# Patient Record
Sex: Female | Born: 1965 | Race: Black or African American | Hispanic: No | Marital: Single | Smoking: Light tobacco smoker
Health system: Southern US, Community
[De-identification: ages and names within clinical notes are randomized; demographics above are authoritative.]

## PROBLEM LIST (undated history)

## (undated) DIAGNOSIS — J449 Chronic obstructive pulmonary disease, unspecified: Secondary | ICD-10-CM

## (undated) DIAGNOSIS — K219 Gastro-esophageal reflux disease without esophagitis: Secondary | ICD-10-CM

## (undated) DIAGNOSIS — E785 Hyperlipidemia, unspecified: Secondary | ICD-10-CM

## (undated) DIAGNOSIS — I739 Peripheral vascular disease, unspecified: Secondary | ICD-10-CM

## (undated) DIAGNOSIS — M1990 Unspecified osteoarthritis, unspecified site: Secondary | ICD-10-CM

## (undated) DIAGNOSIS — C73 Malignant neoplasm of thyroid gland: Secondary | ICD-10-CM

## (undated) DIAGNOSIS — E079 Disorder of thyroid, unspecified: Secondary | ICD-10-CM

## (undated) DIAGNOSIS — I609 Nontraumatic subarachnoid hemorrhage, unspecified: Secondary | ICD-10-CM

## (undated) DIAGNOSIS — G4700 Insomnia, unspecified: Secondary | ICD-10-CM

## (undated) DIAGNOSIS — I2510 Atherosclerotic heart disease of native coronary artery without angina pectoris: Secondary | ICD-10-CM

## (undated) DIAGNOSIS — G459 Transient cerebral ischemic attack, unspecified: Secondary | ICD-10-CM

## (undated) DIAGNOSIS — I10 Essential (primary) hypertension: Secondary | ICD-10-CM

## (undated) DIAGNOSIS — F419 Anxiety disorder, unspecified: Secondary | ICD-10-CM

## (undated) DIAGNOSIS — I509 Heart failure, unspecified: Secondary | ICD-10-CM

## (undated) DIAGNOSIS — E119 Type 2 diabetes mellitus without complications: Secondary | ICD-10-CM

## (undated) DIAGNOSIS — H43392 Other vitreous opacities, left eye: Secondary | ICD-10-CM

## (undated) DIAGNOSIS — I82409 Acute embolism and thrombosis of unspecified deep veins of unspecified lower extremity: Secondary | ICD-10-CM

## (undated) DIAGNOSIS — C4490 Unspecified malignant neoplasm of skin, unspecified: Secondary | ICD-10-CM

## (undated) DIAGNOSIS — J439 Emphysema, unspecified: Secondary | ICD-10-CM

## (undated) DIAGNOSIS — F329 Major depressive disorder, single episode, unspecified: Secondary | ICD-10-CM

## (undated) DIAGNOSIS — N183 Chronic kidney disease, stage 3 unspecified: Secondary | ICD-10-CM

## (undated) HISTORY — DX: Heart failure, unspecified: I50.9

## (undated) HISTORY — DX: Chronic obstructive pulmonary disease, unspecified: J44.9

## (undated) HISTORY — DX: Chronic kidney disease, stage 3 unspecified: N18.30

## (undated) HISTORY — DX: Gastro-esophageal reflux disease without esophagitis: K21.9

## (undated) HISTORY — DX: Atherosclerotic heart disease of native coronary artery without angina pectoris: I25.10

## (undated) HISTORY — DX: Anxiety disorder, unspecified: F41.9

## (undated) HISTORY — PX: EYE SURGERY: SHX253

## (undated) HISTORY — DX: Peripheral vascular disease, unspecified: I73.9

## (undated) HISTORY — DX: Insomnia, unspecified: G47.00

## (undated) HISTORY — DX: Unspecified malignant neoplasm of skin, unspecified: C44.90

## (undated) HISTORY — DX: Emphysema, unspecified: J43.9

## (undated) HISTORY — DX: Malignant neoplasm of thyroid gland: C73

## (undated) HISTORY — DX: Nontraumatic subarachnoid hemorrhage, unspecified: I60.9

## (undated) HISTORY — DX: Hyperlipidemia, unspecified: E78.5

## (undated) HISTORY — DX: Major depressive disorder, single episode, unspecified: F32.9

## (undated) HISTORY — DX: Unspecified osteoarthritis, unspecified site: M19.90

---

## 1989-06-07 HISTORY — PX: TUBAL LIGATION: SHX77

## 2000-01-26 ENCOUNTER — Other Ambulatory Visit: Admission: RE | Admit: 2000-01-26 | Discharge: 2000-01-26 | Payer: Self-pay | Admitting: Family Medicine

## 2000-12-01 ENCOUNTER — Emergency Department (HOSPITAL_COMMUNITY): Admission: EM | Admit: 2000-12-01 | Discharge: 2000-12-01 | Payer: Self-pay | Admitting: Internal Medicine

## 2003-03-19 ENCOUNTER — Emergency Department (HOSPITAL_COMMUNITY): Admission: AD | Admit: 2003-03-19 | Discharge: 2003-03-19 | Payer: Self-pay | Admitting: Family Medicine

## 2004-04-29 ENCOUNTER — Emergency Department (HOSPITAL_COMMUNITY): Admission: EM | Admit: 2004-04-29 | Discharge: 2004-04-29 | Payer: Self-pay | Admitting: Emergency Medicine

## 2005-02-23 ENCOUNTER — Emergency Department (HOSPITAL_COMMUNITY): Admission: EM | Admit: 2005-02-23 | Discharge: 2005-02-23 | Payer: Self-pay | Admitting: Emergency Medicine

## 2005-03-09 ENCOUNTER — Ambulatory Visit: Payer: Self-pay | Admitting: Family Medicine

## 2005-03-16 ENCOUNTER — Emergency Department (HOSPITAL_COMMUNITY): Admission: EM | Admit: 2005-03-16 | Discharge: 2005-03-16 | Payer: Self-pay | Admitting: Family Medicine

## 2005-11-03 ENCOUNTER — Emergency Department (HOSPITAL_COMMUNITY): Admission: EM | Admit: 2005-11-03 | Discharge: 2005-11-03 | Payer: Self-pay | Admitting: Emergency Medicine

## 2005-11-05 ENCOUNTER — Ambulatory Visit (HOSPITAL_COMMUNITY): Admission: RE | Admit: 2005-11-05 | Discharge: 2005-11-05 | Payer: Self-pay | Admitting: Family Medicine

## 2005-11-11 ENCOUNTER — Other Ambulatory Visit: Admission: RE | Admit: 2005-11-11 | Discharge: 2005-11-11 | Payer: Self-pay | Admitting: Otolaryngology

## 2005-11-22 ENCOUNTER — Encounter: Admission: RE | Admit: 2005-11-22 | Discharge: 2005-11-22 | Payer: Self-pay | Admitting: Otolaryngology

## 2005-12-24 ENCOUNTER — Ambulatory Visit (HOSPITAL_COMMUNITY): Admission: RE | Admit: 2005-12-24 | Discharge: 2005-12-25 | Payer: Self-pay | Admitting: Otolaryngology

## 2005-12-24 ENCOUNTER — Encounter (INDEPENDENT_AMBULATORY_CARE_PROVIDER_SITE_OTHER): Payer: Self-pay | Admitting: *Deleted

## 2006-02-10 ENCOUNTER — Encounter (HOSPITAL_COMMUNITY): Admission: RE | Admit: 2006-02-10 | Discharge: 2006-05-11 | Payer: Self-pay | Admitting: Otolaryngology

## 2006-06-07 HISTORY — PX: THYROID SURGERY: SHX805

## 2006-08-01 ENCOUNTER — Ambulatory Visit: Payer: Self-pay | Admitting: Family Medicine

## 2006-09-26 ENCOUNTER — Ambulatory Visit: Payer: Self-pay | Admitting: Family Medicine

## 2007-01-02 ENCOUNTER — Ambulatory Visit: Payer: Self-pay | Admitting: Family Medicine

## 2007-04-03 ENCOUNTER — Ambulatory Visit: Payer: Self-pay | Admitting: Internal Medicine

## 2007-07-19 ENCOUNTER — Ambulatory Visit: Payer: Self-pay | Admitting: Family Medicine

## 2007-08-31 ENCOUNTER — Emergency Department (HOSPITAL_COMMUNITY): Admission: EM | Admit: 2007-08-31 | Discharge: 2007-08-31 | Payer: Self-pay | Admitting: Family Medicine

## 2007-09-07 ENCOUNTER — Ambulatory Visit: Payer: Self-pay | Admitting: Internal Medicine

## 2007-11-29 ENCOUNTER — Ambulatory Visit: Payer: Self-pay | Admitting: Internal Medicine

## 2007-12-01 ENCOUNTER — Ambulatory Visit: Payer: Self-pay | Admitting: Internal Medicine

## 2007-12-05 ENCOUNTER — Ambulatory Visit (HOSPITAL_COMMUNITY): Admission: RE | Admit: 2007-12-05 | Discharge: 2007-12-05 | Payer: Self-pay | Admitting: Family Medicine

## 2008-03-07 ENCOUNTER — Ambulatory Visit: Payer: Self-pay | Admitting: Internal Medicine

## 2008-08-27 ENCOUNTER — Ambulatory Visit: Payer: Self-pay | Admitting: Internal Medicine

## 2008-08-27 ENCOUNTER — Encounter (INDEPENDENT_AMBULATORY_CARE_PROVIDER_SITE_OTHER): Payer: Self-pay | Admitting: Adult Health

## 2008-08-27 LAB — CONVERTED CEMR LAB
BUN: 9 mg/dL (ref 6–23)
Basophils Absolute: 0 10*3/uL (ref 0.0–0.1)
CO2: 24 meq/L (ref 19–32)
Chloride: 107 meq/L (ref 96–112)
Creatinine, Ser: 0.92 mg/dL (ref 0.40–1.20)
Creatinine,U: 177.1 mg/dL
HDL: 64 mg/dL (ref >39)
LDL Cholesterol: 107 mg/dL — ABNORMAL HIGH (ref 0–99)
Lymphocytes Relative: 29 % (ref 12–46)
Lymphs Abs: 2.1 10*3/uL (ref 0.7–4.0)
MCV: 94 fL (ref 78.0–100.0)
Marijuana Metabolite: POSITIVE — AB
Monocytes Absolute: 0.3 10*3/uL (ref 0.1–1.0)
Monocytes Relative: 4 % (ref 3–12)
Neutro Abs: 4.7 10*3/uL (ref 1.7–7.7)
Neutrophils Relative %: 64 % (ref 43–77)
Opiate Screen, Urine: NEGATIVE
Propoxyphene: NEGATIVE
RBC: 4.18 M/uL (ref 3.87–5.11)
RDW: 15.1 % (ref 11.5–15.5)
Total Bilirubin: 0.5 mg/dL (ref 0.3–1.2)
Total CHOL/HDL Ratio: 3.1
Total Protein: 7 g/dL (ref 6.0–8.3)
Triglycerides: 140 mg/dL (ref <150)
WBC: 7.3 10*3/uL (ref 4.0–10.5)

## 2008-09-03 ENCOUNTER — Ambulatory Visit (HOSPITAL_COMMUNITY): Admission: RE | Admit: 2008-09-03 | Discharge: 2008-09-03 | Payer: Self-pay | Admitting: Family Medicine

## 2008-09-10 ENCOUNTER — Ambulatory Visit: Payer: Self-pay | Admitting: Internal Medicine

## 2008-11-14 ENCOUNTER — Ambulatory Visit: Payer: Self-pay | Admitting: Internal Medicine

## 2008-11-26 ENCOUNTER — Ambulatory Visit: Payer: Self-pay | Admitting: *Deleted

## 2008-11-27 ENCOUNTER — Ambulatory Visit: Payer: Self-pay | Admitting: Internal Medicine

## 2009-02-24 ENCOUNTER — Encounter (INDEPENDENT_AMBULATORY_CARE_PROVIDER_SITE_OTHER): Payer: Self-pay | Admitting: Adult Health

## 2009-02-24 ENCOUNTER — Ambulatory Visit: Payer: Self-pay | Admitting: Family Medicine

## 2009-02-24 LAB — CONVERTED CEMR LAB
ALT: 48 units/L — ABNORMAL HIGH (ref 0–35)
AST: 67 units/L — ABNORMAL HIGH (ref 0–37)
BUN: 8 mg/dL (ref 6–23)
Calcium: 8.9 mg/dL (ref 8.4–10.5)
Chloride: 107 meq/L (ref 96–112)
T3 Uptake Ratio: 28.6 % (ref 22.5–37.0)
T4, Total: 0.8 ug/dL — ABNORMAL LOW (ref 5.0–12.5)
Total Bilirubin: 0.6 mg/dL (ref 0.3–1.2)
Total Protein: 6.8 g/dL (ref 6.0–8.3)

## 2009-02-25 ENCOUNTER — Encounter (INDEPENDENT_AMBULATORY_CARE_PROVIDER_SITE_OTHER): Payer: Self-pay | Admitting: Adult Health

## 2009-02-25 LAB — CONVERTED CEMR LAB
Hep A IgM: NEGATIVE
Hepatitis B Surface Ag: NEGATIVE

## 2009-05-26 ENCOUNTER — Encounter (INDEPENDENT_AMBULATORY_CARE_PROVIDER_SITE_OTHER): Payer: Self-pay | Admitting: Adult Health

## 2009-05-26 ENCOUNTER — Ambulatory Visit: Payer: Self-pay | Admitting: Internal Medicine

## 2009-05-26 LAB — CONVERTED CEMR LAB: T4, Total: 0.6 ug/dL — ABNORMAL LOW (ref 5.0–12.5)

## 2009-08-05 ENCOUNTER — Encounter (INDEPENDENT_AMBULATORY_CARE_PROVIDER_SITE_OTHER): Payer: Self-pay | Admitting: Adult Health

## 2009-08-05 ENCOUNTER — Ambulatory Visit: Payer: Self-pay | Admitting: Internal Medicine

## 2009-08-05 LAB — CONVERTED CEMR LAB: TSH: 95.037 microintl units/mL — ABNORMAL HIGH (ref 0.350–4.500)

## 2010-06-27 ENCOUNTER — Encounter: Payer: Self-pay | Admitting: Internal Medicine

## 2010-06-28 ENCOUNTER — Encounter: Payer: Self-pay | Admitting: Otolaryngology

## 2010-06-28 ENCOUNTER — Encounter: Payer: Self-pay | Admitting: Family Medicine

## 2010-07-28 ENCOUNTER — Encounter (INDEPENDENT_AMBULATORY_CARE_PROVIDER_SITE_OTHER): Payer: Self-pay | Admitting: *Deleted

## 2010-07-28 LAB — CONVERTED CEMR LAB
Ferritin: 12 ng/mL (ref 10–291)
MCHC: 32 g/dL (ref 30.0–36.0)
MCV: 98.5 fL (ref 78.0–100.0)
Platelets: 300 10*3/uL (ref 150–400)
RBC: 3.36 M/uL — ABNORMAL LOW (ref 3.87–5.11)
RDW: 15.2 % (ref 11.5–15.5)
WBC: 6.5 10*3/uL (ref 4.0–10.5)

## 2010-09-14 ENCOUNTER — Ambulatory Visit (INDEPENDENT_AMBULATORY_CARE_PROVIDER_SITE_OTHER): Payer: Self-pay

## 2010-09-14 ENCOUNTER — Inpatient Hospital Stay (INDEPENDENT_AMBULATORY_CARE_PROVIDER_SITE_OTHER)
Admission: RE | Admit: 2010-09-14 | Discharge: 2010-09-14 | Disposition: A | Discharge status: Home / Self Care | Payer: Self-pay | Source: Ambulatory Visit | Attending: Family Medicine | Admitting: Family Medicine

## 2010-09-14 DIAGNOSIS — J40 Bronchitis, not specified as acute or chronic: Secondary | ICD-10-CM

## 2010-09-14 DIAGNOSIS — B86 Scabies: Secondary | ICD-10-CM

## 2010-10-23 NOTE — Op Note (Signed)
NAMECHANDI, NICKLIN                ACCOUNT NO.:  [id number]   MEDICAL RECORD NO.:  [id number]          PATIENT TYPE:  OIB   LOCATION:  2550                         FACILITY:  [facility]   PHYSICIAN:  Zola Button T. Lazarus Salines, M.D. DATE OF BIRTH:  01/03/1966   DATE OF PROCEDURE:  12/24/2005  DATE OF DISCHARGE:                                 OPERATIVE REPORT   PREOPERATIVE DIAGNOSIS:  Left thyroid mass.   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   PROCEDURE PERFORMED:  Total thyroidectomy, direct laryngoscopy.   SURGEON:  Dr. Lazarus Salines.   ANESTHESIA:  General orotracheal.   ASSISTANT:  Dr. Jearld Fenton.   BLOOD LOSS:  Less than 25 mL.   COMPLICATIONS:  None.   FINDINGS:  A large smooth left thyroid mass approximately 6 cm in greatest  dimension. by frozen section, suspicious for follicular variant papillary  carcinoma.  No palpable masses in the right thyroid lobe.  No palpable nodes  in the anterior chamber or in either side of the neck.  A prominence in the  left pharynx probably  the greater horn of the thyroid cartilage.   PROCEDURE:  With the patient in the comfortable supine position, general  orotracheal anesthesia was induced without difficulty.  During intubation, a  mass was noted in the left pharynx by Anesthesia.  Direct laryngoscopy for  confirmation was felt indicated.   At an appropriate level, a rubber tooth guard was placed on the upper teeth  and the anterior commissure laryngoscope was introduced and laryngoscopy was  performed.  There was a submucosal prominence on the left pharynx felt to be  the hyoid bone by digital palpation.  There was no mucosal irregularity.  No  specific therapy was required.  The laryngoscope was removed as was the  tooth guard.   The patient was placed in slight reverse Trendelenburg.  The lower neck was  palpated with the findings as described above.  A shoulder roll was placed  and the neck was extended and the head  supported.  The lower neck was  palpated once again.  1% Xylocaine with 1:100,000 epinephrine, 8 mL total  was infiltrated into a previously identified wrinkle at the proposed  incision site.  A sterile preparation and draping of the entire low neck was  performed.   A 10-cm transverse incision was marked and sharply executed in the pre-  established wrinkle.  This was carried down through skin, subcutaneous fat,  and a very tenuous platysma muscle laterally was identified.  Subplatysmal  planes were raised superiorly and inferiorly to the level of the thyroid  notch, and to the level of the sternal notch.  The Mahorner retractor was  placed and expanded for visualization.   The midline raphe of the strap muscles was identified and divided and the  strap muscles were retracted laterally in layers.  The strap muscles were  dissected bluntly and sharply off the anterior surface of the left thyroid  mass.  A small pyramidal lobe was identified and dissected.  The isthmus was  isolated in the pretracheal plane and controlled with hemostats  just to the  right of the midline to include the pyramidal lobe with the resected  specimen.  The thyroid was transected and the right lobe was controlled with  a 2-0 silk suture ligature.   Using blunt dissection where possible, the strap muscles were freed from the  lateral face of the large thyroid mass.  Working along the superior pole,  staying directly on the capsule, adventitial tissue was lysed and vessels  were controlled with Lahey clips and transected.  The dissection was worked  over the superior pole of the left thyroid lobe.  A branch of the superior  laryngeal nerve was identified and preserved.  The lobe was retracted  forward and working on the posterior surface, the remainder of the  superficial thyroid vessels was controlled directly on the capsule.  A  parathyroid gland was identified and preserved.  Dissection was carried  around the  inferior pole and branches of the inferior thyroid pedicle were  carefully controlled.  Rolling the gland medially and anteriorly, the  esophageal groove was identified and the recurrent laryngeal nerve was  identified, followed and preserved.  The gland was rolled away from the  nerve.  Sharp and cautery dissection was performed on Berry's ligament away  from the nerve.  The gland was delivered intact.  A small additional  quantity of cautery away from the nerve was sufficient for hemostasis.  Valsalva was performed and hemostasis was observed.  The left thyroid lobe  was sent for frozen section interpretation.   While waiting for the frozen section, the wound was thoroughly irrigated.  A  drain was placed in the left thyroid bed and secured to lower wound margin,  and the wound was closed in layers in the strap muscles and the skin.  Before completing the closure, the frozen section returned as suspicious for  follicular variant papillary carcinoma.  The wound was reopened and the  Mahorner retractor was replaced.   The wound had been carefully palpated before closing with no palpable masses  in the anterior compartment, in the jugular chain on either side, or in the  right lobe of the thyroid.  It was palpated again at this point with the  same findings.   The strap muscles were dissected off the lateral face of the lobe which was  very much smaller on the right side.  The dissection was carried around the  inferior pole and around the superior pole working directly on the capsule  and vessels were clipped and divided.  The gland was rolled anteriorly and  medially and the dissection was carried on the capsule posteriorly.  The  recurrent laryngeal nerve was identified inferiorly and preserved.  The  dissection was carried away from the nerve up onto the capsule of the gland  which was finally dissected away from the trachea and delivered.  This was also sent for pathologic  interpretation.  Again, a small amount of cautery  was required for hemostasis.  Valsalva revealed no additional bleeding.  The  wound was thoroughly irrigated.  No additional drain was felt required.   The strap muscles were closed in layers with 4-0 chromic suture.  The drain  was placed in the left thyroid bed.  It was secured to the lower wound edge  with 4-0 nylon stitch.  The subcutaneous layer including the platysma muscle  was closed with interrupted 4-0 chromic sutures.  Finally, the skin was  closed in a cosmetic fashion with a running simple  5-0 Ethilon stitch.  Hemostasis was observed.  Ointment was applied.  The patient was returned to  Anesthesia, awakened, extubated, and transferred to recovery in stable  condition.   COMMENT:  A 45 year old black female with a recent development of a left  thyroid mass, needle aspiration suspicious for follicular epithelium but not  obviously malignant was indication for today's procedure.  Upon the  pathologist relative certain determination that the left thyroid lobe  compressed carcinoma, the right lobe was removed in anticipation of later I-  131 therapy.  Anticipate routine postoperative recovery with attention to  analgesia and elevation.  Both recurrent laryngeal nerves were identified  and preserved and there was at least 1 visible  parathyroid on each side which was also preserved, so do not anticipate  significant problems with either of these structures.  Will observe  overnight and remove the drain in the morning and then probably discharge  her to her home.      Gloris Manchester. Lazarus Salines, M.D.  Electronically Signed     KTW/MEDQ  D:  12/24/2005  T:  12/24/2005  Job:  [id number]   cc:   Tarri Abernethy, M.D.

## 2012-04-11 ENCOUNTER — Encounter (HOSPITAL_COMMUNITY): Payer: Self-pay | Admitting: Emergency Medicine

## 2012-04-11 ENCOUNTER — Emergency Department (INDEPENDENT_AMBULATORY_CARE_PROVIDER_SITE_OTHER)
Admission: EM | Admit: 2012-04-11 | Discharge: 2012-04-11 | Disposition: A | Discharge status: Home / Self Care | Payer: Self-pay | Source: Home / Self Care

## 2012-04-11 DIAGNOSIS — I10 Essential (primary) hypertension: Secondary | ICD-10-CM

## 2012-04-11 DIAGNOSIS — E039 Hypothyroidism, unspecified: Secondary | ICD-10-CM

## 2012-04-11 HISTORY — DX: Essential (primary) hypertension: I10

## 2012-04-11 HISTORY — DX: Disorder of thyroid, unspecified: E07.9

## 2012-04-11 LAB — POCT I-STAT, CHEM 8
BUN: 11 mg/dL (ref 6–23)
Creatinine, Ser: 1.4 mg/dL — ABNORMAL HIGH (ref 0.50–1.10)
Hemoglobin: 11.9 g/dL — ABNORMAL LOW (ref 12.0–15.0)
Sodium: 140 mEq/L (ref 135–145)

## 2012-04-11 MED ORDER — HYDROCHLOROTHIAZIDE 25 MG PO TABS: 25.0000 mg | ORAL_TABLET | Freq: Every day | ORAL | Status: DC

## 2012-04-11 MED ORDER — LEVOTHYROXINE SODIUM 25 MCG PO TABS: ORAL_TABLET | ORAL | Status: DC

## 2012-04-11 NOTE — ED Notes (Signed)
Pt is here to have meds refilled but does not know what meds she's taking... Was going to [facility] but has not seen a PCP since than... Sx today include: swelling of feet and hands x2 months, dizziness, headaches, SOB... Denies: fevers, vomiting, nausea, diarrhea, chest pains... States she has a lot of family stressors at the moment... Pt is alert w/no signs of distress.

## 2012-04-11 NOTE — ED Provider Notes (Signed)
History     CSN: [id number]  Arrival date & time 04/11/12  4098   First MD Initiated Contact with Patient 04/11/12 [phone]      Chief Complaint  Patient presents with  . Medication Refill    (Consider location/radiation/quality/duration/timing/severity/associated sxs/prior treatment) HPI Comments: Residual female with a history of hypothyroidism and hypertension presents with a request to have medications refilled. [facility] She does not know what medication she was taking noted she think to bring her medications with her. She states she been out of medications for over a couple of months. Is also saying that she has headaches numbness in her legs and she is under a lot of stress. She says she has no chest pain, heaviness, tightness, fullness or pressure... she does feel tired. She occasionally has shortness of breath particularly when she becomes upset and she also describes exertional dyspnea. She states she has been under a lot of domestic stress recently with family in jail and children that do not monitor. He states that when she gets upset which is quite often and that she will develop shortness of breath associated with the situation and at that time feel numbness all over.   Past Medical History  Diagnosis Date  . Thyroid disease   . Hypertension     Past Surgical History  Procedure Date  . Eye surgery     No family history on file.  History  Substance Use Topics  . Smoking status: Current Every Day Smoker -- 0.2 packs/day    Types: Cigarettes  . Smokeless tobacco: Not on file  . Alcohol Use: No    OB History    Grav Para Term Preterm Abortions TAB SAB Ect Mult Living                  Review of Systems  Constitutional: Negative for fever, activity change and fatigue.  HENT: Negative.  Negative for ear pain, nosebleeds, rhinorrhea, trouble swallowing and neck pain.   Respiratory: Negative for cough, chest tightness, shortness of  breath and wheezing.   Cardiovascular: Positive for leg swelling. Negative for chest pain and palpitations.  Gastrointestinal: Negative.   Genitourinary: Negative.   Musculoskeletal: Negative.  Negative for myalgias.  Skin: Negative for color change, pallor and rash.  Neurological: Negative.     Allergies  Bee venom  Home Medications   Current Outpatient Rx  Name  Route  Sig  Dispense  Refill  . HYDROCHLOROTHIAZIDE 25 MG PO TABS   Oral   Take 1 tablet (25 mg total) by mouth daily.   30 tablet   0   . LEVOTHYROXINE SODIUM 25 MCG PO TABS      1 tab q d   30 tablet   0     BP 157/117  Pulse 80  Temp 97.9 F (36.6 C) (Oral)  Resp 16  SpO2 98%  Physical Exam  Constitutional: She is oriented to person, place, and time. She appears well-developed and well-nourished. No distress.  HENT:  Head: Normocephalic and atraumatic.  Right Ear: External ear normal.  Left Ear: External ear normal.  Mouth/Throat: Oropharynx is clear and moist. No oropharyngeal exudate.       Right eye is prosthetic  Eyes: EOM are normal. Pupils are equal, round, and reactive to light. Left eye exhibits no discharge. No scleral icterus.       Prosthetic right eye  Neck: Normal range of motion. Neck supple. No JVD present.  No carotid bruits  Cardiovascular: Normal rate, normal heart sounds and intact distal pulses.   Pulmonary/Chest: Effort normal and breath sounds normal. No respiratory distress. She has no wheezes. She has no rales.  Abdominal: Soft. There is no tenderness.  Musculoskeletal: Normal range of motion. She exhibits no edema and no tenderness.  Lymphadenopathy:    She has no cervical adenopathy.  Neurological: She is alert and oriented to person, place, and time. No cranial nerve deficit.  Skin: Skin is warm and dry. No rash noted. No erythema.  Psychiatric: She has a normal mood and affect.       Speech is clear    ED Course  Procedures (including critical care  time)  Labs Reviewed  POCT I-STAT, CHEM 8 - Abnormal; Notable for the following:    Creatinine, Ser 1.40 (*)     Hemoglobin 11.9 (*)     HCT 35.0 (*)     All other components within normal limits   No results found.   1. HTN (hypertension), benign   2. Hypothyroidism       MDM   Results for orders placed during the hospital encounter of 04/11/12  POCT I-STAT, CHEM 8      Component Value Range   Sodium 140  135 - 145 mEq/L   Potassium 3.7  3.5 - 5.1 mEq/L   Chloride 106  96 - 112 mEq/L   BUN 11  6 - 23 mg/dL   Creatinine, Ser 7.25 (*) 0.50 - 1.10 mg/dL   Glucose, Bld 86  70 - 99 mg/dL   Calcium, Ion 3.66  4.40 - 1.23 mmol/L   TCO2 26  0 - 100 mmol/L   Hemoglobin 11.9 (*) 12.0 - 15.0 g/dL   HCT 34.7 (*) 42.5 - 95.6 %   Physical exam essentially unremarkable today she has no current chest discomfort or shortness of breath, nor does she have any evidence of edema. We will prescribe HCTZ 25 mg one daily #30. And: Levothyroxin 0.25 mg one daily. + PCP soon as possible may return for any symptoms problems or worsening         Hayden Rasmussen, NP 04/11/12 1053

## 2012-04-11 NOTE — ED Notes (Signed)
Notified Hayden Rasmussen, NP, of pt's BP

## 2012-04-14 NOTE — ED Provider Notes (Signed)
Medical screening examination/treatment/procedure(s) were performed by resident physician or non-physician practitioner and as supervising physician I was immediately available for consultation/collaboration.   Barkley Bruns MD.    Linna Hoff, MD 04/14/12 [phone]

## 2012-05-29 ENCOUNTER — Emergency Department (INDEPENDENT_AMBULATORY_CARE_PROVIDER_SITE_OTHER)
Admission: EM | Admit: 2012-05-29 | Discharge: 2012-05-29 | Disposition: A | Discharge status: Home / Self Care | Payer: Self-pay | Source: Home / Self Care

## 2012-05-29 DIAGNOSIS — Z720 Tobacco use: Secondary | ICD-10-CM

## 2012-05-29 DIAGNOSIS — I10 Essential (primary) hypertension: Secondary | ICD-10-CM

## 2012-05-29 DIAGNOSIS — E039 Hypothyroidism, unspecified: Secondary | ICD-10-CM

## 2012-05-29 DIAGNOSIS — F17200 Nicotine dependence, unspecified, uncomplicated: Secondary | ICD-10-CM

## 2012-05-29 MED ORDER — HYDROCHLOROTHIAZIDE 25 MG PO TABS: 25.0000 mg | ORAL_TABLET | Freq: Every day | ORAL | Status: DC

## 2012-05-29 MED ORDER — CLONIDINE HCL 0.1 MG PO TABS
ORAL_TABLET | ORAL | Status: AC
  Filled 2012-05-29: qty 1

## 2012-05-29 MED ORDER — CLONIDINE HCL 0.1 MG PO TABS
0.1000 mg | ORAL_TABLET | Freq: Once | ORAL | Status: AC
  Administered 2012-05-29: 0.1 mg via ORAL

## 2012-05-29 MED ORDER — LEVOTHYROXINE SODIUM 25 MCG PO TABS: ORAL_TABLET | ORAL | Status: DC

## 2012-05-29 NOTE — ED Notes (Signed)
Former [facility] client- needs medication refill 

## 2012-05-29 NOTE — ED Provider Notes (Signed)
Patient Demographics  Jordan Harvey, is a 46 y.o. female  ZOX:[id number]  WJX:[id number]  DOB - 1966/04/19  Chief Complaint  Patient presents with  . Medication Refill        Subjective:   Jordan Harvey today is here for a follow up vist. Patient has a history of hypertension and hypothyroidism, ever since he ran out of her medications approximately more than a month ago, she has not been taking any of her antihypertensive medications or her thyroid medications. Her son and daughter-in-law were recently incarcerated, she is now the primary caregiver for a 17 month grandchild. She at times feels overwhelmed by taking care of a child so young. She appeared tearful at times. Patient has No headache, No chest pain, No abdominal pain - No Nausea, No new weakness tingling or numbness, No Cough - SOB. She does appear anxious, however denies any depression currently. She also feels anxious because she has been off her blood pressure pills for more than a month.  Objective:    Filed Vitals:   05/29/12 1341  BP: 163/117  Pulse: 69  Temp: 98.8 F (37.1 C)  TempSrc: Oral  Resp: 20  SpO2: 96%     ALLERGIES:   Allergies  Allergen Reactions  . Bee Venom     PAST MEDICAL HISTORY: Past Medical History  Diagnosis Date  . Thyroid disease   . Hypertension     PAST SURGICAL HISTORY: Past Surgical History  Procedure Date  . Eye surgery     FAMILY HISTORY: No family history on file.  MEDICATIONS AT HOME: Prior to Admission medications   Medication Sig Start Date End Date Taking? Authorizing Provider  hydrochlorothiazide (HYDRODIURIL) 25 MG tablet Take 1 tablet (25 mg total) by mouth daily. 05/29/12   Roylee Chaffin Levora Dredge, MD  levothyroxine (SYNTHROID, LEVOTHROID) 25 MCG tablet 1 tab q d 05/29/12   Keilany Burnette Levora Dredge, MD    REVIEW OF SYSTEMS:  Constitutional:   No   Fevers, chills, fatigue.  HEENT:    No headaches, Sore throat,   Cardio-vascular: No chest pain,  Orthopnea,  swelling in lower extremities, anasarca, palpitations  GI:  No abdominal pain, nausea, vomiting, diarrhea  Resp: No shortness of breath,  No coughing up of blood.No cough.No wheezing.  Skin:  no rash or lesions.  GU:  no dysuria, change in color of urine, no urgency or frequency.  No flank pain.  Musculoskeletal: No joint pain or swelling.  No decreased range of motion.  No back pain.  Psych: No change in mood or affect. No depression.  No memory loss.   Exam  General appearance :Awake, alert, not in any distress. Speech Clear. Not toxic Looking HEENT: Atraumatic and Normocephalic, pupils equally reactive to light and accomodation Neck: supple, no JVD. No cervical lymphadenopathy.  Chest:Good air entry bilaterally, no added sounds  CVS: S1 S2 regular, no murmurs.  Abdomen: Bowel sounds present, Non tender and not distended with no gaurding, rigidity or rebound. Extremities: B/L Lower Ext shows no edema, both legs are warm to touch Neurology: Awake alert, and oriented X 3, CN II-XII intact, Non focal Skin:No Rash Wounds:N/A    Data Review   CBC No results found for this basename: WBC:5,HGB:5,HCT:5,PLT:5,MCV:5,MCH:5,MCHC:5,RDW:5,NEUTRABS:5,LYMPHSABS:5,MONOABS:5,EOSABS:5,BASOSABS:5,BANDABS:5,BANDSABD:5 in the last 168 hours  Chemistries   No results found for this basename: NA:5,K:5,CL:5,CO2:5,GLUCOSE:5,BUN:5,CREATININE:5,GFRCGP,:5,CALCIUM:5,MG:5,AST:5,ALT:5,ALKPHOS:5,BILITOT:5 in the last 168 hours ------------------------------------------------------------------------------------------------------------------ No results found for this basename: HGBA1C:2 in the last 72 hours ------------------------------------------------------------------------------------------------------------------ No results found for this basename: CHOL:2,HDL:2,LDLCALC:2,TRIG:2,CHOLHDL:2,LDLDIRECT:2 in the last 72  hours ------------------------------------------------------------------------------------------------------------------ No results found for this basename: TSH,T4TOTAL,FREET3,T3FREE,THYROIDAB in the last 72 hours ------------------------------------------------------------------------------------------------------------------ No results found for this basename: VITAMINB12:2,FOLATE:2,FERRITIN:2,TIBC:2,IRON:2,RETICCTPCT:2 in the last 72 hours  Coagulation profile  No results found for this basename: INR:5,PROTIME:5 in the last 168 hours    Assessment & Plan   Hypertension - Restart hydrochlorothiazide - BP significantly elevated this visit, will give one dose of 0.1 mg of clonidine and recheck in a while.  Hypothyroidism - Resume Synthroid at 25 mcg - Check TSH prior to next visit  Anxiety - Secondary to multiple stressors (taking care of her young grandchild, and being out of blood pressure pills.) - Have reassured her that if she follows up we can take care of medical needs. - To see if anxiety levels are controlled, after starting her on her usual medications. This will need to be reassessed at next visit. - If she still appears anxious, she may need SSRI.  Tobacco abuse - I have counseled her extensively about the need to immediately stop tobacco use. She claims understanding.   Follow-up Information    Follow up with [facility]. Schedule an appointment as soon as possible for a visit in 10 days.          Maretta Bees, MD 05/29/12 [phone]

## 2012-06-12 ENCOUNTER — Emergency Department (INDEPENDENT_AMBULATORY_CARE_PROVIDER_SITE_OTHER)
Admission: EM | Admit: 2012-06-12 | Discharge: 2012-06-12 | Disposition: A | Discharge status: Home / Self Care | Payer: Self-pay | Source: Home / Self Care

## 2012-06-12 ENCOUNTER — Encounter (HOSPITAL_COMMUNITY): Payer: Self-pay

## 2012-06-12 DIAGNOSIS — R5383 Other fatigue: Secondary | ICD-10-CM

## 2012-06-12 DIAGNOSIS — I10 Essential (primary) hypertension: Secondary | ICD-10-CM

## 2012-06-12 DIAGNOSIS — E039 Hypothyroidism, unspecified: Secondary | ICD-10-CM

## 2012-06-12 DIAGNOSIS — R5381 Other malaise: Secondary | ICD-10-CM

## 2012-06-12 LAB — CBC
Hemoglobin: 11.6 g/dL — ABNORMAL LOW (ref 12.0–15.0)
MCV: 94.9 fL (ref 78.0–100.0)
Platelets: 267 10*3/uL (ref 150–400)
RBC: 3.69 MIL/uL — ABNORMAL LOW (ref 3.87–5.11)
RDW: 14.3 % (ref 11.5–15.5)
WBC: 8.8 10*3/uL (ref 4.0–10.5)

## 2012-06-12 LAB — COMPREHENSIVE METABOLIC PANEL
Chloride: 101 mEq/L (ref 96–112)
Creatinine, Ser: 1.21 mg/dL — ABNORMAL HIGH (ref 0.50–1.10)
GFR calc non Af Amer: 53 mL/min — ABNORMAL LOW (ref >90)
Glucose, Bld: 86 mg/dL (ref 70–99)
Potassium: 3.6 mEq/L (ref 3.5–5.1)
Sodium: 140 mEq/L (ref 135–145)

## 2012-06-12 LAB — LIPID PANEL: Cholesterol: 306 mg/dL — ABNORMAL HIGH (ref 0–200)

## 2012-06-12 MED ORDER — POTASSIUM CHLORIDE ER 10 MEQ PO TBCR: 10.0000 meq | EXTENDED_RELEASE_TABLET | Freq: Every day | ORAL | Status: DC

## 2012-06-12 NOTE — ED Provider Notes (Signed)
History     CSN: [id number]  Arrival date & time 06/12/12  1010   Chief Complaint  Patient presents with  . Follow-up   HPI Pt reports that she didn't take her BP meds or thyroid meds this morning but she has been taking since her last visit.   Pt says she feels tired all the time.   Pt says that she is feeling a little better since restarting her medications.  Pt is cramping in hands and feet and having severe pain with that.   Her hair is falling out.    Past Medical History  Diagnosis Date  . Thyroid disease   . Hypertension     Past Surgical History  Procedure Date  . Eye surgery     No family history on file.  History  Substance Use Topics  . Smoking status: Current Every Day Smoker -- 0.2 packs/day    Types: Cigarettes  . Smokeless tobacco: Not on file  . Alcohol Use: No    OB History    Grav Para Term Preterm Abortions TAB SAB Ect Mult Living                 Review of Systems  Constitutional: Positive for fatigue and unexpected weight change.  HENT: Negative.   Eyes: Negative.   Respiratory: Negative.   Cardiovascular: Negative.   Gastrointestinal: Negative.   Musculoskeletal: Negative.   Skin: Negative.   Neurological: Negative.   Hematological: Negative.   Psychiatric/Behavioral: Negative.     Allergies  Bee venom  Home Medications   Current Outpatient Rx  Name  Route  Sig  Dispense  Refill  . HYDROCHLOROTHIAZIDE 25 MG PO TABS   Oral   Take 1 tablet (25 mg total) by mouth daily.   30 tablet   2   . LEVOTHYROXINE SODIUM 25 MCG PO TABS      1 tab q d   30 tablet   2     BP 143/103  Pulse 75  Temp 98.5 F (36.9 C) (Oral)  Resp 19  SpO2 100%  Physical Exam  Nursing note and vitals reviewed. Constitutional: She is oriented to person, place, and time. She appears well-developed and well-nourished. No distress.  HENT:  Head: Normocephalic and atraumatic.  Eyes: EOM are normal. Pupils are equal, round, and reactive to light.  Neck:  Normal range of motion. Neck supple.  Cardiovascular: Normal rate, regular rhythm and normal heart sounds.   Pulmonary/Chest: Effort normal and breath sounds normal. No respiratory distress.  Abdominal: Soft. Bowel sounds are normal.  Musculoskeletal: Normal range of motion. She exhibits no edema.  Neurological: She is alert and oriented to person, place, and time.  Skin: Skin is warm and dry.  Psychiatric: She has a normal mood and affect. Her behavior is normal. Judgment and thought content normal.    ED Course  Procedures (including critical care time)  Labs Reviewed - No data to display No results found.  No diagnosis found.  MDM  IMPRESSION  Hypertension  Hypothyroidism  Suspected hypokalemia  RECOMMENDATIONS / PLAN Check labs today Encouraged pt to please take meds regularly Add KCL 10 meq po daily   FOLLOW UP 3 weeks for BP recheck  The patient was given clear instructions to go to ER or return to medical center if symptoms don't improve, worsen or new problems develop.  The patient verbalized understanding.  The patient was told to call to get lab results if they haven't heard anything  in the next week.            Jordan Fleet, MD 06/12/12 1101

## 2012-06-12 NOTE — ED Notes (Signed)
Follow up hypertension

## 2012-06-13 ENCOUNTER — Telehealth (HOSPITAL_COMMUNITY): Payer: Self-pay

## 2012-06-13 MED ORDER — PRAVASTATIN SODIUM 20 MG PO TABS: 20.0000 mg | ORAL_TABLET | Freq: Every day | ORAL | Status: DC

## 2012-06-13 NOTE — Telephone Encounter (Signed)
Message copied by Lestine Mount on Tue Jun 13, 2012  6:53 PM ------      Message from: [name]      Created: Mon Jun 12, 2012  5:26 PM      Regarding: please call in medication       Please call in this cholesterol medication for patient.        Pravastatin 20 mg take 1 po daily, #30, RFx3            Rodney Langton, MD, CDE, FAAFP      [facility]      [facility]      [place], [place]

## 2012-06-13 NOTE — Progress Notes (Signed)
Pt was notified about labs and elevated cholesterol level and need to try pravastatin 20 mg daily which was sent to [facility]  ([place])  Rodney Langton, MD, CDE, FAAFP [facility] [place], [place]

## 2012-06-13 NOTE — Telephone Encounter (Signed)
Message copied by Lestine Mount on Tue Jun 13, 2012  6:42 PM ------      Message from: [name]      Created: Mon Jun 12, 2012  5:25 PM       Please notify patient that her TSH is very high at 92.  She needs to please continue taking thyroid medication.  Recheck thyroid test in 2 months.  Also her kidney function was abnormal but stable from previous tests.  Her cholesterol is very elevated.  She needs to take her cholesterol medications.  Her liver tests are stable.  Her hemoglobin is stable.            Rodney Langton, MD, CDE, FAAFP      [facility]      [facility]      [place], [place]

## 2012-07-03 ENCOUNTER — Emergency Department (INDEPENDENT_AMBULATORY_CARE_PROVIDER_SITE_OTHER)
Admission: EM | Admit: 2012-07-03 | Discharge: 2012-07-03 | Disposition: A | Discharge status: Home / Self Care | Payer: Self-pay | Source: Home / Self Care | Attending: Family Medicine | Admitting: Family Medicine

## 2012-07-03 ENCOUNTER — Encounter (HOSPITAL_COMMUNITY): Payer: Self-pay

## 2012-07-03 DIAGNOSIS — I10 Essential (primary) hypertension: Secondary | ICD-10-CM

## 2012-07-03 DIAGNOSIS — E785 Hyperlipidemia, unspecified: Secondary | ICD-10-CM

## 2012-07-03 DIAGNOSIS — E039 Hypothyroidism, unspecified: Secondary | ICD-10-CM

## 2012-07-03 MED ORDER — PRAVASTATIN SODIUM 20 MG PO TABS: 20.0000 mg | ORAL_TABLET | Freq: Every day | ORAL | Status: DC

## 2012-07-03 MED ORDER — ATENOLOL 25 MG PO TABS: 25.0000 mg | ORAL_TABLET | Freq: Every day | ORAL | Status: DC

## 2012-07-03 MED ORDER — HYDROCHLOROTHIAZIDE 12.5 MG PO TABS: 12.5000 mg | ORAL_TABLET | Freq: Every day | ORAL | Status: DC

## 2012-07-03 MED ORDER — LEVOTHYROXINE SODIUM 50 MCG PO TABS: ORAL_TABLET | ORAL | Status: DC

## 2012-07-03 NOTE — ED Notes (Signed)
Follow up hypertension

## 2012-07-03 NOTE — ED Provider Notes (Signed)
History     CSN: [id number]  Arrival date & time 07/03/12  1024   First MD Initiated Contact with Patient 07/03/12 1025      Chief Complaint  Patient presents with  . Follow-up    (Consider location/radiation/quality/duration/timing/severity/associated sxs/prior treatment) HPI Patient reports that she's taking her medications as prescribed.  She is tolerating her medication.  She did not take her medicine this morning.  She reports that she has less hair loss.  She was recently diagnosed with hypothyroidism and had a very high TSH greater than 90.  She's been taking 25 mcg of levothyroxine.  She reports that she still not feeling back to her normal self but she is noticing some mild improvements.  Her blood pressure is slightly elevated today.  She is having no headache no sinus congestion or pain.  She denies shortness of breath and chest pain.  She is having a very difficult time being able to afford her medications.  She reports that the potassium supplement causeway too much.  She says is costing more than $30 per month and she's not able to afford that medication.  She reports that she can only afford something on the $4 medication plan.  She would like a change in her regimen that is more affordable.  She is asking to please make some adjustments due to financial reasons and constraints.  She reports that she still having some cramping in the feet and hands.  Past Medical History  Diagnosis Date  . Thyroid disease   . Hypertension     Past Surgical History  Procedure Date  . Eye surgery     No family history on file.  History  Substance Use Topics  . Smoking status: Current Every Day Smoker -- 0.2 packs/day    Types: Cigarettes  . Smokeless tobacco: Not on file  . Alcohol Use: No    OB History    Grav Para Term Preterm Abortions TAB SAB Ect Mult Living                 Review of Systems  HENT: Positive for congestion and rhinorrhea.   Musculoskeletal: Positive for  arthralgias.       Cramping in feet and hands  All other systems reviewed and are negative.    Allergies  Bee venom  Home Medications   Current Outpatient Rx  Name  Route  Sig  Dispense  Refill  . HYDROCHLOROTHIAZIDE 25 MG PO TABS   Oral   Take 1 tablet (25 mg total) by mouth daily.   30 tablet   2   . LEVOTHYROXINE SODIUM 25 MCG PO TABS      1 tab q d   30 tablet   2   . POTASSIUM CHLORIDE ER 10 MEQ PO TBCR   Oral   Take 1 tablet (10 mEq total) by mouth daily.   30 tablet   3   . PRAVASTATIN SODIUM 20 MG PO TABS   Oral   Take 1 tablet (20 mg total) by mouth daily.   30 tablet   3     BP 140/117  Pulse 79  Temp 98.5 F (36.9 C) (Oral)  Resp 18  SpO2 100%  Physical Exam  Nursing note and vitals reviewed. Constitutional: She is oriented to person, place, and time. She appears well-developed and well-nourished. No distress.  HENT:  Head: Normocephalic and atraumatic.  Eyes: EOM are normal. Pupils are equal, round, and reactive to light.  Neck:  Normal range of motion. Neck supple. No JVD present. No thyromegaly present.  Cardiovascular: Normal rate, regular rhythm and normal heart sounds.   Pulmonary/Chest: Effort normal and breath sounds normal.  Abdominal: Soft. Bowel sounds are normal.  Musculoskeletal: Normal range of motion. She exhibits no edema and no tenderness.  Lymphadenopathy:    She has no cervical adenopathy.  Neurological: She is alert and oriented to person, place, and time. She has normal reflexes. No cranial nerve deficit.  Skin: Skin is warm and dry. No rash noted. No erythema. No pallor.  Psychiatric: She has a normal mood and affect. Her behavior is normal. Judgment and thought content normal.    ED Course  Procedures (including critical care time)  Labs Reviewed - No data to display No results found.  No diagnosis found.  MDM  IMPRESSION  Hypertension  Hyperlipidemia  Hypothyroidism    RECOMMENDATIONS /  PLAN Modifications to her blood pressure program made today to try and improve the cost profile.  She's having significant cramping in the feet and legs and hands.  She cannot afford potassium supplementation.  I'm going to decrease her hydrochlorothiazide to 12.5 mg daily.  I'm going to add atenolol 50 mg daily to her regimen.  We will discontinue the potassium chloride supplementation because of cost.  Also we will increase her levothyroxine to 50 mcg daily. HCTZ 12.5 mg po daily Atenolol 50 mg po daily DC KCL (because of cost) Levothyroxine 50 mcg po daily Pravastatin 20 mg po daily RTC in 2 months for labs and follow up  Call with home BP readings in 2-3 weeks  FOLLOW UP 2 months for labs and followup of blood pressure  The patient was given clear instructions to go to ER or return to medical center if symptoms don't improve, worsen or new problems develop.  The patient verbalized understanding.  The patient was told to call to get lab results if they haven't heard anything in the next week.            [name], MD 07/03/12 1357

## 2012-08-11 ENCOUNTER — Encounter (HOSPITAL_COMMUNITY): Payer: Self-pay

## 2012-08-11 ENCOUNTER — Emergency Department (HOSPITAL_COMMUNITY)
Admission: EM | Admit: 2012-08-11 | Discharge: 2012-08-11 | Disposition: A | Discharge status: Home / Self Care | Payer: Self-pay | Attending: Emergency Medicine | Admitting: Emergency Medicine

## 2012-08-11 ENCOUNTER — Emergency Department (HOSPITAL_COMMUNITY): Payer: Self-pay

## 2012-08-11 DIAGNOSIS — A599 Trichomoniasis, unspecified: Secondary | ICD-10-CM | POA: Insufficient documentation

## 2012-08-11 DIAGNOSIS — F17200 Nicotine dependence, unspecified, uncomplicated: Secondary | ICD-10-CM | POA: Insufficient documentation

## 2012-08-11 DIAGNOSIS — Z79899 Other long term (current) drug therapy: Secondary | ICD-10-CM | POA: Insufficient documentation

## 2012-08-11 DIAGNOSIS — I10 Essential (primary) hypertension: Secondary | ICD-10-CM | POA: Insufficient documentation

## 2012-08-11 DIAGNOSIS — E079 Disorder of thyroid, unspecified: Secondary | ICD-10-CM | POA: Insufficient documentation

## 2012-08-11 DIAGNOSIS — N739 Female pelvic inflammatory disease, unspecified: Secondary | ICD-10-CM | POA: Insufficient documentation

## 2012-08-11 DIAGNOSIS — N898 Other specified noninflammatory disorders of vagina: Secondary | ICD-10-CM | POA: Insufficient documentation

## 2012-08-11 DIAGNOSIS — N949 Unspecified condition associated with female genital organs and menstrual cycle: Secondary | ICD-10-CM | POA: Insufficient documentation

## 2012-08-11 DIAGNOSIS — N730 Acute parametritis and pelvic cellulitis: Secondary | ICD-10-CM

## 2012-08-11 LAB — COMPREHENSIVE METABOLIC PANEL
ALT: 18 U/L (ref 0–35)
AST: 19 U/L (ref 0–37)
Albumin: 3.4 g/dL — ABNORMAL LOW (ref 3.5–5.2)
Alkaline Phosphatase: 54 U/L (ref 39–117)
BUN: 11 mg/dL (ref 6–23)
CO2: 26 mEq/L (ref 19–32)
Calcium: 8.8 mg/dL (ref 8.4–10.5)
Chloride: 105 mEq/L (ref 96–112)
Creatinine, Ser: 1.08 mg/dL (ref 0.50–1.10)
GFR calc Af Amer: 70 mL/min — ABNORMAL LOW (ref >90)
GFR calc non Af Amer: 61 mL/min — ABNORMAL LOW (ref >90)
Glucose, Bld: 103 mg/dL — ABNORMAL HIGH (ref 70–99)
Potassium: 3.6 mEq/L (ref 3.5–5.1)
Sodium: 141 mEq/L (ref 135–145)
Total Bilirubin: 0.4 mg/dL (ref 0.3–1.2)
Total Protein: 6.8 g/dL (ref 6.0–8.3)

## 2012-08-11 LAB — CBC WITH DIFFERENTIAL/PLATELET
Basophils Absolute: 0 10*3/uL (ref 0.0–0.1)
Basophils Relative: 0 % (ref 0–1)
Eosinophils Absolute: 0.1 10*3/uL (ref 0.0–0.7)
Eosinophils Relative: 1 % (ref 0–5)
HCT: 34.8 % — ABNORMAL LOW (ref 36.0–46.0)
Hemoglobin: 12 g/dL (ref 12.0–15.0)
Lymphocytes Relative: 20 % (ref 12–46)
Lymphs Abs: 1.7 10*3/uL (ref 0.7–4.0)
MCH: 31.9 pg (ref 26.0–34.0)
MCHC: 34.5 g/dL (ref 30.0–36.0)
MCV: 92.6 fL (ref 78.0–100.0)
Monocytes Absolute: 0.4 10*3/uL (ref 0.1–1.0)
Monocytes Relative: 4 % (ref 3–12)
Neutro Abs: 6.5 10*3/uL (ref 1.7–7.7)
Neutrophils Relative %: 75 % (ref 43–77)
Platelets: 244 10*3/uL (ref 150–400)
RBC: 3.76 MIL/uL — ABNORMAL LOW (ref 3.87–5.11)
RDW: 15.2 % (ref 11.5–15.5)
WBC: 8.7 10*3/uL (ref 4.0–10.5)

## 2012-08-11 LAB — URINALYSIS, ROUTINE W REFLEX MICROSCOPIC
Bilirubin Urine: NEGATIVE
Ketones, ur: NEGATIVE mg/dL
Nitrite: NEGATIVE
Protein, ur: NEGATIVE mg/dL
Urobilinogen, UA: 0.2 mg/dL (ref 0.0–1.0)

## 2012-08-11 LAB — WET PREP, GENITAL: Yeast Wet Prep HPF POC: NONE SEEN

## 2012-08-11 LAB — URINE MICROSCOPIC-ADD ON

## 2012-08-11 MED ORDER — MORPHINE SULFATE 4 MG/ML IJ SOLN
4.0000 mg | Freq: Once | INTRAMUSCULAR | Status: AC
  Administered 2012-08-11: 4 mg via INTRAVENOUS
  Filled 2012-08-11: qty 1

## 2012-08-11 MED ORDER — HYDROCODONE-ACETAMINOPHEN 5-325 MG PO TABS: 1.0000 | ORAL_TABLET | Freq: Four times a day (QID) | ORAL | Status: DC | PRN

## 2012-08-11 MED ORDER — METRONIDAZOLE 500 MG PO TABS
2000.0000 mg | ORAL_TABLET | Freq: Once | ORAL | Status: AC
  Administered 2012-08-11: 2000 mg via ORAL
  Filled 2012-08-11: qty 4

## 2012-08-11 MED ORDER — SODIUM CHLORIDE 0.9 % IV BOLUS (SEPSIS)
1000.0000 mL | Freq: Once | INTRAVENOUS | Status: AC
  Administered 2012-08-11: 1000 mL via INTRAVENOUS

## 2012-08-11 MED ORDER — DOXYCYCLINE HYCLATE 100 MG PO CAPS: 100.0000 mg | ORAL_CAPSULE | Freq: Two times a day (BID) | ORAL | Status: DC

## 2012-08-11 MED ORDER — DEXTROSE 5 % IV SOLN
250.0000 mg | Freq: Once | INTRAVENOUS | Status: AC
  Administered 2012-08-11: 250 mg via INTRAVENOUS
  Filled 2012-08-11: qty 250

## 2012-08-11 NOTE — ED Notes (Signed)
Lower abdominal pain , denies any n/d did have 1 episode of vomiting denies any vaginal discharge or bleeding denies any UTI symptoms

## 2012-08-11 NOTE — ED Provider Notes (Signed)
History     CSN: [id number]  Arrival date & time 08/11/12  1245   First MD Initiated Contact with Patient 08/11/12 1250      Chief Complaint  Patient presents with  . Abdominal Pain    (Consider location/radiation/quality/duration/timing/severity/associated sxs/prior treatment) HPI Patient presents to the emergency department with lower pelvic pain.  On the left.  Patient, states this began 3 days, ago.  Patient denies nausea, vomiting, diarrhea, headache, blurred vision, fever, back pain, dysuria, vaginal bleeding, or blood in her stool.  Patient denies taking anything prior to arrival for her symptoms.  Patient, states, that palpation and movement make her pain, worse. Past Medical History  Diagnosis Date  . Thyroid disease   . Hypertension     Past Surgical History  Procedure Laterality Date  . Eye surgery      No family history on file.  History  Substance Use Topics  . Smoking status: Current Every Day Smoker -- 0.25 packs/day    Types: Cigarettes  . Smokeless tobacco: Not on file  . Alcohol Use: No    OB History   Grav Para Term Preterm Abortions TAB SAB Ect Mult Living                  Review of Systems All other systems negative except as documented in the HPI. All pertinent positives and negatives as reviewed in the HPI. Allergies  Bee venom  Home Medications   Current Outpatient Rx  Name  Route  Sig  Dispense  Refill  . hydrochlorothiazide (HYDRODIURIL) 25 MG tablet   Oral   Take 25 mg by mouth daily.         . potassium chloride (K-DUR) 10 MEQ tablet   Oral   Take 5 mEq by mouth daily.         . pravastatin (PRAVACHOL) 20 MG tablet   Oral   Take 1 tablet (20 mg total) by mouth daily.   30 tablet   3   . levothyroxine (SYNTHROID, LEVOTHROID) 50 MCG tablet   Oral   Take 50 mcg by mouth daily.            BP 137/78  Pulse 70  Temp(Src) 98.6 F (37 C) (Oral)  Resp 14  Ht 5\' 3"  (1.6 m)  Wt 165 lb (74.844 kg)  BMI 29.24 kg/m2   SpO2 99%  LMP 07/14/2012  Physical Exam  Nursing note and vitals reviewed. Constitutional: She is oriented to person, place, and time. She appears well-developed and well-nourished. No distress.  HENT:  Head: Normocephalic and atraumatic.  Mouth/Throat: Oropharynx is clear and moist.  Eyes: Pupils are equal, round, and reactive to light.  Cardiovascular: Normal rate, regular rhythm and normal heart sounds.  Exam reveals no gallop and no friction rub.   No murmur heard. Pulmonary/Chest: Effort normal and breath sounds normal. No respiratory distress.  Abdominal: Soft. Normal appearance and bowel sounds are normal. She exhibits no distension. There is tenderness. There is no rebound and no guarding.    Genitourinary: Cervix exhibits no motion tenderness. Right adnexum displays tenderness. Left adnexum displays tenderness. There is tenderness around the vagina. No erythema or bleeding around the vagina. No foreign body around the vagina. Vaginal discharge found.  Neurological: She is alert and oriented to person, place, and time.  Skin: Skin is warm and dry. No rash noted.    ED Course  Procedures (including critical care time)  Labs Reviewed  WET PREP, GENITAL - Abnormal;  Notable for the following:    Trich, Wet Prep MODERATE (*)    Clue Cells Wet Prep HPF POC FEW (*)    WBC, Wet Prep HPF POC FEW (*)    All other components within normal limits  URINALYSIS, ROUTINE W REFLEX MICROSCOPIC - Abnormal; Notable for the following:    APPearance HAZY (*)    Leukocytes, UA TRACE (*)    All other components within normal limits  COMPREHENSIVE METABOLIC PANEL - Abnormal; Notable for the following:    Glucose, Bld 103 (*)    Albumin 3.4 (*)    GFR calc non Af Amer 61 (*)    GFR calc Af Amer 70 (*)    All other components within normal limits  CBC WITH DIFFERENTIAL - Abnormal; Notable for the following:    RBC 3.76 (*)    HCT 34.8 (*)    All other components within normal limits  URINE  MICROSCOPIC-ADD ON - Abnormal; Notable for the following:    Squamous Epithelial / LPF MANY (*)    Bacteria, UA FEW (*)    All other components within normal limits  GC/CHLAMYDIA PROBE AMP   patient awaiting results of ultrasound.  Patient retreated most likely for pelvic pelvic inflammatory disease and Trichomonas.  Patient's is stable here in the emergency department.  He was eating fried chicken legs.  While I was examining her   MDM  MDM Number of Diagnoses or Management Options  MDM Reviewed: nursing note, vitals and previous chart Interpretation: labs and ultrasound             Carlyle Dolly, PA-C 08/11/12 1619

## 2012-08-11 NOTE — ED Notes (Signed)
Pharmacy at bedside

## 2012-08-11 NOTE — ED Notes (Signed)
Pt c/o left lower pain that radiates into pelvic region, started 4 days ago but pain has gotten worse. Pt c/o urinary urgency, denies dysuria. Pt also c/o dry mouth, sts she hasn't been drinking much water. Pt in nad, ambulatory to room, skin warm and dry, resp e/u. Pt reports she has been out of some of her medications.

## 2012-08-11 NOTE — ED Notes (Signed)
Pt eating chicken wings.

## 2012-08-11 NOTE — ED Notes (Signed)
Pt brought back to room; getting undressed and into a gown; pt placed on continuous pulse oximetry and blood pressure cuff; family at bedside

## 2012-08-12 NOTE — ED Provider Notes (Signed)
Medical screening examination/treatment/procedure(s) were performed by non-physician practitioner and as supervising physician I was immediately available for consultation/collaboration  Harrold Donath R. Rubin Payor, MD 08/12/12 [phone]

## 2012-08-15 LAB — GC/CHLAMYDIA PROBE AMP
CT Probe RNA: NEGATIVE
GC Probe RNA: NEGATIVE

## 2012-09-01 ENCOUNTER — Encounter: Payer: Self-pay | Admitting: Obstetrics & Gynecology

## 2012-11-02 ENCOUNTER — Emergency Department (INDEPENDENT_AMBULATORY_CARE_PROVIDER_SITE_OTHER)
Admission: EM | Admit: 2012-11-02 | Discharge: 2012-11-02 | Disposition: A | Discharge status: Nursing Home (Includes ICF) | Payer: Self-pay | Source: Home / Self Care

## 2012-11-02 ENCOUNTER — Encounter (HOSPITAL_COMMUNITY): Payer: Self-pay | Admitting: *Deleted

## 2012-11-02 ENCOUNTER — Emergency Department (HOSPITAL_COMMUNITY): Payer: Medicare Other

## 2012-11-02 ENCOUNTER — Emergency Department (HOSPITAL_COMMUNITY)
Admission: EM | Admit: 2012-11-02 | Discharge: 2012-11-02 | Disposition: A | Discharge status: Home / Self Care | Payer: Medicare Other | Attending: Emergency Medicine | Admitting: Emergency Medicine

## 2012-11-02 DIAGNOSIS — Z85850 Personal history of malignant neoplasm of thyroid: Secondary | ICD-10-CM | POA: Insufficient documentation

## 2012-11-02 DIAGNOSIS — F17200 Nicotine dependence, unspecified, uncomplicated: Secondary | ICD-10-CM | POA: Insufficient documentation

## 2012-11-02 DIAGNOSIS — M79609 Pain in unspecified limb: Secondary | ICD-10-CM

## 2012-11-02 DIAGNOSIS — Z79899 Other long term (current) drug therapy: Secondary | ICD-10-CM | POA: Insufficient documentation

## 2012-11-02 DIAGNOSIS — R897 Abnormal histological findings in specimens from other organs, systems and tissues: Secondary | ICD-10-CM

## 2012-11-02 DIAGNOSIS — Z9119 Patient's noncompliance with other medical treatment and regimen: Secondary | ICD-10-CM | POA: Insufficient documentation

## 2012-11-02 DIAGNOSIS — T7500XA Unspecified effects of lightning, initial encounter: Secondary | ICD-10-CM

## 2012-11-02 DIAGNOSIS — Z91199 Patient's noncompliance with other medical treatment and regimen due to unspecified reason: Secondary | ICD-10-CM | POA: Insufficient documentation

## 2012-11-02 DIAGNOSIS — Z9114 Patient's other noncompliance with medication regimen: Secondary | ICD-10-CM

## 2012-11-02 DIAGNOSIS — M79605 Pain in left leg: Secondary | ICD-10-CM

## 2012-11-02 DIAGNOSIS — R011 Cardiac murmur, unspecified: Secondary | ICD-10-CM | POA: Insufficient documentation

## 2012-11-02 DIAGNOSIS — R748 Abnormal levels of other serum enzymes: Secondary | ICD-10-CM | POA: Insufficient documentation

## 2012-11-02 DIAGNOSIS — M7989 Other specified soft tissue disorders: Secondary | ICD-10-CM | POA: Insufficient documentation

## 2012-11-02 DIAGNOSIS — R600 Localized edema: Secondary | ICD-10-CM

## 2012-11-02 DIAGNOSIS — E079 Disorder of thyroid, unspecified: Secondary | ICD-10-CM | POA: Insufficient documentation

## 2012-11-02 DIAGNOSIS — R609 Edema, unspecified: Secondary | ICD-10-CM

## 2012-11-02 DIAGNOSIS — I10 Essential (primary) hypertension: Secondary | ICD-10-CM | POA: Insufficient documentation

## 2012-11-02 HISTORY — DX: Malignant neoplasm of thyroid gland: C73

## 2012-11-02 LAB — POCT I-STAT, CHEM 8
Creatinine, Ser: 1.1 mg/dL (ref 0.50–1.10)
HCT: 37 % (ref 36.0–46.0)
Hemoglobin: 12.6 g/dL (ref 12.0–15.0)
Potassium: 3.1 mEq/L — ABNORMAL LOW (ref 3.5–5.1)
Sodium: 139 mEq/L (ref 135–145)

## 2012-11-02 LAB — CK TOTAL AND CKMB (NOT AT ARMC)
CK, MB: 15 ng/mL (ref 0.3–4.0)
Total CK: 2842 U/L — ABNORMAL HIGH (ref 7–177)

## 2012-11-02 MED ORDER — HYDROCODONE-ACETAMINOPHEN 5-325 MG PO TABS: 1.0000 | ORAL_TABLET | Freq: Three times a day (TID) | ORAL | Status: DC | PRN

## 2012-11-02 MED ORDER — HYDROCODONE-ACETAMINOPHEN 5-325 MG PO TABS
1.0000 | ORAL_TABLET | Freq: Once | ORAL | Status: AC
  Administered 2012-11-02: 1 via ORAL
  Filled 2012-11-02: qty 1

## 2012-11-02 MED ORDER — HYDROCODONE-ACETAMINOPHEN 5-325 MG PO TABS
1.0000 | ORAL_TABLET | Freq: Once | ORAL | Status: AC
  Administered 2012-11-02: 1 via ORAL

## 2012-11-02 MED ORDER — HYDROCODONE-ACETAMINOPHEN 5-325 MG PO TABS
ORAL_TABLET | ORAL | Status: AC
  Filled 2012-11-02: qty 1
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
## 2012-11-02 NOTE — ED Notes (Signed)
L ppp

## 2012-11-02 NOTE — ED Provider Notes (Signed)
Medical screening examination/treatment/procedure(s) were performed by non-physician practitioner and as supervising physician I was immediately available for consultation/collaboration.  Leslee Home, M.D.  Reuben Likes, MD 11/02/12 2033
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
## 2012-11-02 NOTE — ED Notes (Signed)
Nurse informed of blood pressure

## 2012-11-02 NOTE — ED Notes (Signed)
Patient is resting in the waiting room comfortable and talking on the phone, laughing.  Apologized for the wait and explained the waiting process.

## 2012-11-02 NOTE — ED Notes (Signed)
Pt. states she called her daughter and told her she was going to the ED.
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
## 2012-11-07 ENCOUNTER — Ambulatory Visit: Payer: Self-pay

## 2012-11-07 NOTE — ED Provider Notes (Signed)
Medical screening examination/treatment/procedure(s) were performed by non-physician practitioner and as supervising physician I was immediately available for consultation/collaboration.   Nelia Shi, MD 11/07/12 [phone]

## 2013-01-23 ENCOUNTER — Encounter (HOSPITAL_COMMUNITY): Payer: Self-pay | Admitting: Emergency Medicine

## 2013-01-23 ENCOUNTER — Emergency Department (INDEPENDENT_AMBULATORY_CARE_PROVIDER_SITE_OTHER)
Admission: EM | Admit: 2013-01-23 | Discharge: 2013-01-23 | Disposition: A | Discharge status: Home / Self Care | Payer: Medicare Other | Source: Home / Self Care

## 2013-01-23 ENCOUNTER — Emergency Department (HOSPITAL_COMMUNITY)
Admission: EM | Admit: 2013-01-23 | Discharge: 2013-01-23 | Disposition: A | Discharge status: Home / Self Care | Payer: Medicare Other | Attending: Emergency Medicine | Admitting: Emergency Medicine

## 2013-01-23 DIAGNOSIS — R51 Headache: Secondary | ICD-10-CM | POA: Insufficient documentation

## 2013-01-23 DIAGNOSIS — I161 Hypertensive emergency: Secondary | ICD-10-CM

## 2013-01-23 DIAGNOSIS — Z85850 Personal history of malignant neoplasm of thyroid: Secondary | ICD-10-CM | POA: Insufficient documentation

## 2013-01-23 DIAGNOSIS — I10 Essential (primary) hypertension: Secondary | ICD-10-CM | POA: Insufficient documentation

## 2013-01-23 DIAGNOSIS — F17200 Nicotine dependence, unspecified, uncomplicated: Secondary | ICD-10-CM | POA: Insufficient documentation

## 2013-01-23 DIAGNOSIS — R0602 Shortness of breath: Secondary | ICD-10-CM | POA: Insufficient documentation

## 2013-01-23 DIAGNOSIS — Z862 Personal history of diseases of the blood and blood-forming organs and certain disorders involving the immune mechanism: Secondary | ICD-10-CM | POA: Insufficient documentation

## 2013-01-23 DIAGNOSIS — Z8639 Personal history of other endocrine, nutritional and metabolic disease: Secondary | ICD-10-CM | POA: Insufficient documentation

## 2013-01-23 DIAGNOSIS — I160 Hypertensive urgency: Secondary | ICD-10-CM

## 2013-01-23 DIAGNOSIS — R209 Unspecified disturbances of skin sensation: Secondary | ICD-10-CM | POA: Insufficient documentation

## 2013-01-23 DIAGNOSIS — R5381 Other malaise: Secondary | ICD-10-CM | POA: Insufficient documentation

## 2013-01-23 DIAGNOSIS — R0789 Other chest pain: Secondary | ICD-10-CM | POA: Insufficient documentation

## 2013-01-23 LAB — CBC
HCT: 35.3 % — ABNORMAL LOW (ref 36.0–46.0)
MCH: 32.9 pg (ref 26.0–34.0)
MCHC: 35.1 g/dL (ref 30.0–36.0)
MCV: 93.6 fL (ref 78.0–100.0)
RDW: 15.4 % (ref 11.5–15.5)
WBC: 8.5 10*3/uL (ref 4.0–10.5)

## 2013-01-23 LAB — BASIC METABOLIC PANEL
BUN: 10 mg/dL (ref 6–23)
Calcium: 9.2 mg/dL (ref 8.4–10.5)
Chloride: 105 mEq/L (ref 96–112)
Creatinine, Ser: 1.11 mg/dL — ABNORMAL HIGH (ref 0.50–1.10)
GFR calc Af Amer: 67 mL/min — ABNORMAL LOW (ref >90)

## 2013-01-23 MED ORDER — HYDRALAZINE HCL 20 MG/ML IJ SOLN
10.0000 mg | Freq: Once | INTRAMUSCULAR | Status: AC
  Administered 2013-01-23: 10 mg via INTRAVENOUS
  Filled 2013-01-23: qty 1

## 2013-01-23 MED ORDER — ENALAPRILAT 1.25 MG/ML IV SOLN
1.2500 mg | Freq: Once | INTRAVENOUS | Status: AC
  Administered 2013-01-23: 1.25 mg via INTRAVENOUS
  Filled 2013-01-23: qty 1

## 2013-01-23 MED ORDER — HYDROCHLOROTHIAZIDE 25 MG PO TABS: 25.0000 mg | ORAL_TABLET | Freq: Every day | ORAL | Status: DC

## 2013-01-23 MED ORDER — LISINOPRIL 10 MG PO TABS: 10.0000 mg | ORAL_TABLET | Freq: Every day | ORAL | Status: DC

## 2013-01-23 NOTE — ED Provider Notes (Signed)
CSN: [id number]     Arrival date & time 01/23/13  1344 History     First MD Initiated Contact with Patient 01/23/13 1711     Chief Complaint  Patient presents with  . Hypertension   (Consider location/radiation/quality/duration/timing/severity/associated sxs/prior Treatment) HPI For 47-year-old female with hypertension and hypothyroidism presents to the emergency department from urgent care with elevated blood pressure.  Patient reports that she has had blurry vision, chest pain, shortness of breath, and lower extremity numbness and tingling for the past 2 months. This morning she went to [facility]. There she was found to have an elevated blood pressure of 200/145. She subsequently presented to urgent care for evaluation.  At urgent care, EKG was done and revealed T wave inversions in V3 and V4. Given patient's symptoms she was sent to the emergency department for further additional evaluation.  Patient reports that she's had the above symptoms for quite some time. She has been out of her medications for over 6 months. She's felt poorly over this period of time stating that she feels "old". She has had intermittent left-sided chest pain associated with exertion.  She states that it resolves quickly over a matter of minutes (approximately 10 minutes).  No associated nausea, vomiting, or diaphoresis.  She also reports intermittent blurry vision, headache, and right hand tingling/numbness.  She also endorses a right lower extremity numbness/tingling and associated weakness as well as "crying a lot" and feeling depressed.   Past Medical History  Diagnosis Date  . Thyroid disease   . Hypertension   . Cancer of thyroid    Past Surgical History  Procedure Laterality Date  . Eye surgery    . Thyroid surgery  2008    Not sure what they did  . Tubal ligation  1991   Family History  Problem Relation Age of Onset  . Breast cancer Mother    History  Substance Use Topics  . Smoking status:  Current Every Day Smoker -- 0.33 packs/day    Types: Cigarettes  . Smokeless tobacco: Not on file  . Alcohol Use: No   OB History   Grav Para Term Preterm Abortions TAB SAB Ect Mult Living                 Review of Systems  Constitutional: Negative for fever and chills.  Respiratory: Positive for shortness of breath.   Cardiovascular: Positive for chest pain.  Gastrointestinal: Negative for nausea, vomiting and abdominal distention.  Genitourinary: Negative.   Musculoskeletal: Negative.   Skin: Negative for rash.  Neurological: Positive for weakness, numbness and headaches.   Allergies  Bee venom  Home Medications   No current outpatient prescriptions on file. BP 127/113  Pulse 98  Temp(Src) 97.9 F (36.6 C) (Oral)  Resp 18  SpO2 100%  LMP 11/23/2012 Physical Exam  Constitutional: She is oriented to person, place, and time. She appears well-developed and well-nourished.  HENT:  Head: Normocephalic.  Left pupil equal and reactive. Patient suffered a traumatic injury years ago.  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   Cardiovascular: Regular rhythm.  Exam reveals no gallop and no friction rub.   No murmur heard. Tachycardic.  Pulmonary/Chest: Effort normal and breath sounds normal. She has no wheezes. She has no rales.  Abdominal: Soft. She exhibits no distension. There is no tenderness.  Musculoskeletal:  No lower extremity edema.  Neurological: She is alert and oriented to person, place, and time. No cranial nerve deficit.  Muscle strength  5/5 in all extremities.   Skin: Skin is warm and dry.  Psychiatric:  Flat affect noted.    ED Course   Procedures (including critical care time)   Date: 01/23/2013  Rate: 85  Rhythm: normal sinus rhythm  QRS Axis: left  Intervals: normal  ST/T Wave abnormalities: Flattening T waves noted.  T wave inversions noted in V3 - V4.   Conduction Disutrbances:none  Old EKG Reviewed: changes noted; New inversions.  Labs  Reviewed  CBC  BASIC METABOLIC PANEL  TROPONIN I   No results found. No diagnosis found.  MDM  47 year old female with history of hypertension and hypothyroidism presents from urgent care with elevated blood pressure. - Given symptoms will obtain basic labs and troponin. - Will treat hypertension with enalaprilat and reevaluate.  1850 - Patient's blood pressure still elevated. Patient still reporting headache and hand numbness/tingling. Will give IV hydralazine and monitor for improvement. CBC is unremarkable. BMP revealed mild renal insufficiency, but creatinine is at baseline. Troponin negative.  2000 - BP improved to 176/116.  Patient stable and okay for discharge home with close follow up.    Tommie Sams, DO 01/23/13 2008

## 2013-01-23 NOTE — ED Notes (Signed)
Pt c/o HBP... Has not had meds in over 6 months... No PCP Sxs today include: HA, SOB, edema, weakness, blurry vision, numbness of legs and tingly of hands States she almost crashed while coming here due to her sxs Denies: CP at the moment Alert and on cell phone w/no signs of acute distress.

## 2013-01-23 NOTE — ED Notes (Signed)
Pt coming from UC; Was driving car yesterday and she says her vision became blurry and got numbness and tingling in legs. Was headed to [facility] today but when they checked her pressure, they sent her to UC. BP's are 200/150. Has px for HTN meds but has not filled them in months. They did not give her HTN meds at [facility]. Has IV; EMS heplocked at this time.

## 2013-01-23 NOTE — ED Notes (Signed)
Called [facility]... No truck avail... Called EMS and report given to them.

## 2013-01-23 NOTE — ED Provider Notes (Signed)
Jordan Harvey is a 47 y.o. female who presents to Urgent Care today for elevated blood pressure associated with headache blurry vision Lotrimin weakness and slurred speech present for 2 weeks.  Patient has been out of her blood pressure medication now for over 6 months. She does not have insurance and has been unable to be seen by the community wellness clinic.  She was seen at the [facility] this morning for mental health care when she was found to have a blood pressure of 200/145 and she was sent to the emergency room.  She presented to the [facility] urgent care instead.  She denies any chest pain or palpitations but does note some shortness of breath. No nausea vomiting or diarrhea.    PMH reviewed. Hypertension, history of thyroid cancer History  Substance Use Topics  . Smoking status: Current Every Day Smoker -- 0.33 packs/day    Types: Cigarettes  . Smokeless tobacco: Not on file  . Alcohol Use: No   ROS as above Medications reviewed. No current facility-administered medications for this encounter.   Current Outpatient Prescriptions  Medication Sig Dispense Refill  . hydrochlorothiazide (HYDRODIURIL) 25 MG tablet Take 25 mg by mouth daily.      Marland Kitchen HYDROcodone-acetaminophen (NORCO/VICODIN) 5-325 MG per tablet Take 1 tablet by mouth every 8 (eight) hours as needed for pain.  30 tablet  0  . levothyroxine (SYNTHROID, LEVOTHROID) 50 MCG tablet Take 50 mcg by mouth daily.       . potassium chloride (K-DUR) 10 MEQ tablet Take 5 mEq by mouth daily.      . pravastatin (PRAVACHOL) 20 MG tablet Take 1 tablet (20 mg total) by mouth daily.  30 tablet  3    Exam:  BP 200/142  Pulse 85  Temp(Src) 98.6 F (37 C) (Oral)  Resp 16  SpO2 100%  LMP 11/23/2012 Gen: Well NAD HEENT: EOMI,  MMM left eye only. Right eye is false Lungs: Work of breathing. Decreased breath sounds bilaterally Heart: RRR no MRG Abd: NABS, NT, ND Exts: Non edematous BL  LE, warm and well perfused.  Neuro:  Alert and oriented. Speech is mildly slurred. Cranial nerves are intact Sensation and strength is intact.   No results found for this or any previous visit (from the past 24 hour(s)). No results found. Twelve-lead EKG shows normal sinus rhythm at 85 beats per minute. Left axis deviation. Flat T waves. New T wave inversion in V3 and V4  Assessment and Plan: 47 y.o. female with hypertensive emergency/urgency.  Patient has severely elevated blood pressure associated with headache blurry vision somewhat slurred speech.  Plan to transfer to the emergency room via EMS further evaluation and management.      Rodolph Bong, MD 01/23/13 1248

## 2013-01-24 ENCOUNTER — Emergency Department (HOSPITAL_COMMUNITY)
Admission: EM | Admit: 2013-01-24 | Discharge: 2013-01-24 | Disposition: A | Discharge status: Home / Self Care | Payer: Medicare Other | Attending: Emergency Medicine | Admitting: Emergency Medicine

## 2013-01-24 ENCOUNTER — Encounter (HOSPITAL_COMMUNITY): Payer: Self-pay | Admitting: Emergency Medicine

## 2013-01-24 DIAGNOSIS — Z760 Encounter for issue of repeat prescription: Secondary | ICD-10-CM

## 2013-01-24 DIAGNOSIS — Z862 Personal history of diseases of the blood and blood-forming organs and certain disorders involving the immune mechanism: Secondary | ICD-10-CM | POA: Insufficient documentation

## 2013-01-24 DIAGNOSIS — Z79899 Other long term (current) drug therapy: Secondary | ICD-10-CM | POA: Insufficient documentation

## 2013-01-24 DIAGNOSIS — Z8639 Personal history of other endocrine, nutritional and metabolic disease: Secondary | ICD-10-CM | POA: Insufficient documentation

## 2013-01-24 DIAGNOSIS — I10 Essential (primary) hypertension: Secondary | ICD-10-CM | POA: Insufficient documentation

## 2013-01-24 DIAGNOSIS — Z85850 Personal history of malignant neoplasm of thyroid: Secondary | ICD-10-CM | POA: Insufficient documentation

## 2013-01-24 DIAGNOSIS — F17200 Nicotine dependence, unspecified, uncomplicated: Secondary | ICD-10-CM | POA: Insufficient documentation

## 2013-01-24 MED ORDER — LISINOPRIL 10 MG PO TABS: 10.0000 mg | ORAL_TABLET | Freq: Every day | ORAL | Status: DC

## 2013-01-24 MED ORDER — HYDROCHLOROTHIAZIDE 25 MG PO TABS: 25.0000 mg | ORAL_TABLET | Freq: Every day | ORAL | Status: DC

## 2013-01-24 NOTE — ED Provider Notes (Signed)
CSN: [id number]     Arrival date & time 01/24/13  1421 History    This chart was scribed for a non-physician practitioner, Antony Madura, PA-C, working with Enid Skeens, MD by Frederik Pear, ED Scribe. This patient was seen in room TR10C/TR10C and the patient's care was started at 1502.   First MD Initiated Contact with Patient 01/24/13 1502     Chief Complaint  Patient presents with  . Medication Refill   (Consider location/radiation/quality/duration/timing/severity/associated sxs/prior Treatment) Patient is a 47 y.o. female presenting with hypertension. The history is provided by the patient and medical records. No language interpreter was used.  Hypertension This is a chronic problem. The current episode started more than 1 week ago. The problem occurs constantly. The problem has been gradually worsening. Pertinent negatives include no chest pain and no shortness of breath.    HPI Comments: Jordan Harvey is a 47 y.o. female with a h/o of hypertension who presents to the Emergency Department complaining of a medications refill for hydrochlorothiazide and lisinopril. She has a h/o of hypertension, which is currently untreated. She was seen yesterday in the ED and complained of numbness, and tinging, which has since resolved and was refills for both prescriptions, but states her family member took her prescription to get it filled and it got chewed up by the machine at [facility]. She denies, fever, vision changes, nausea, emesis, numbness, tingling, and mouth pain.   Past Medical History  Diagnosis Date  . Thyroid disease   . Hypertension   . Cancer of thyroid    Past Surgical History  Procedure Laterality Date  . Eye surgery    . Thyroid surgery  2008    Not sure what they did  . Tubal ligation  1991   Family History  Problem Relation Age of Onset  . Breast cancer Mother    History  Substance Use Topics  . Smoking status: Current Every Day Smoker -- 0.33 packs/day    Types:  Cigarettes  . Smokeless tobacco: Not on file  . Alcohol Use: No   OB History   Grav Para Term Preterm Abortions TAB SAB Ect Mult Living                 Review of Systems  Constitutional: Negative for fever.  Eyes: Negative for visual disturbance.  Respiratory: Negative for cough and shortness of breath.   Cardiovascular: Negative for chest pain.       Hypertension  Gastrointestinal: Negative for nausea and vomiting.  All other systems reviewed and are negative.   Allergies  Bee venom  Home Medications   Current Outpatient Rx  Name  Route  Sig  Dispense  Refill  . hydrochlorothiazide (HYDRODIURIL) 25 MG tablet   Oral   Take 1 tablet (25 mg total) by mouth daily.   30 tablet   1   . lisinopril (PRINIVIL,ZESTRIL) 10 MG tablet   Oral   Take 1 tablet (10 mg total) by mouth daily.   30 tablet   1    BP 158/111  Pulse 92  Temp(Src) 97.9 F (36.6 C)  Resp 16  SpO2 100%  LMP 11/23/2012  Physical Exam  Nursing note and vitals reviewed. Constitutional: She is oriented to person, place, and time. She appears well-developed and well-nourished. No distress.  HENT:  Head: Normocephalic and atraumatic.  Right Ear: Tympanic membrane normal.  Left Ear: Tympanic membrane normal.  Mouth/Throat: Oropharynx is clear and moist. No oropharyngeal exudate.  Eyes: Conjunctivae  and EOM are normal. Pupils are equal, round, and reactive to light. No scleral icterus.  Neck: Normal range of motion.  Cardiovascular: Normal rate, regular rhythm and normal heart sounds.  Exam reveals no gallop and no friction rub.   No murmur heard. Pulmonary/Chest: Effort normal. No respiratory distress. She has no wheezes. She has no rales. She exhibits no tenderness.  Musculoskeletal: Normal range of motion.  Neurological: She is alert and oriented to person, place, and time. GCS eye subscore is 4. GCS verbal subscore is 5. GCS motor subscore is 6.  Patient was extremities without ataxia. She speaks in  full goal oriented sentences.  Skin: Skin is warm and dry. No rash noted. She is not diaphoretic. No erythema. No pallor.  Psychiatric: She has a normal mood and affect. Her behavior is normal.   ED Course   Procedures (including critical care time)  DIAGNOSTIC STUDIES: Oxygen Saturation is 100% on room air, normal by my interpretation.    COORDINATION OF CARE:  15:36-Discussed planned course of treatment for discharge with the patient, including a prescription for lisinopril and hydrochlorothiazide, who is agreeable at this time.  Labs Reviewed - No data to display No results found.  1. Encounter for medication refill    MDM  47 year old female with a history of hypertension who presents for a refill of her hydrochlorothiazide and lisinopril. She is well and nontoxic appearing and hemodynamically stable. Patient endorses being asymptomatic at this time. Rx for HCTZ and Lisinopril given along with referral to [facility] for BP recheck in 24-48 hours. Indications for ED return provided and patient agreeable to d/c plan with no unaddressed concerns.  I personally performed the services described in this documentation, which was scribed in my presence. The recorded information has been reviewed and is accurate.     Antony Madura, PA-C 01/24/13 1709  Medical screening examination/treatment/procedure(s) were performed by non-physician practitioner and as supervising physician I was immediately available for consultation/collaboration.  Enid Skeens, MD 01/25/13 2101

## 2013-01-24 NOTE — ED Provider Notes (Signed)
I saw and evaluated the patient, reviewed the resident's note and I agree with the findings and plan.  Pertinent History: 47 year old female history of hypertension which is currently untreated. She presents with hypertension, complaints of numbness and tingling which is intermittent and nonfocal, complaints of sometimes feeling off balance when she walks. On exam the patient has no focal findings, but has normal coordination with finger-nose-finger, heel shin, strength and sensation of all 4 extremities and cranial nerves III through XII. Her blood pressure was significantly elevated, keeping in mind that she has been medicated for greater than 6 months this did improve significantly while in the emergency department and the patient is a symptomatically discharge. I offered the patient admission to the hospital but she declined stating that she wanted to take medications at home. She was prescribed cost-effective medications and agreed to fill them on discharge. Family members with the patient agreed to help.  I personally interpreted the EKG as well as the resident and agree with the interpretation on the resident's chart.   Vida Roller, MD 01/24/13 1046

## 2013-01-24 NOTE — ED Notes (Signed)
Pt states she was here last pm. Got Rx for BP meds but her family member lost it. She is here today for new Rx for HCTZ 25mg  and Lisinopril 10mg .

## 2013-01-24 NOTE — ED Notes (Signed)
Went to get her meds filled  From last night and her rx got torn up in drive thru at [facility]

## 2013-11-20 ENCOUNTER — Emergency Department (HOSPITAL_COMMUNITY)
Admission: EM | Admit: 2013-11-20 | Discharge: 2013-11-20 | Disposition: A | Discharge status: Home / Self Care | Payer: Medicare Other | Attending: Emergency Medicine | Admitting: Emergency Medicine

## 2013-11-20 ENCOUNTER — Encounter (HOSPITAL_COMMUNITY): Payer: Self-pay | Admitting: Emergency Medicine

## 2013-11-20 DIAGNOSIS — F17200 Nicotine dependence, unspecified, uncomplicated: Secondary | ICD-10-CM | POA: Insufficient documentation

## 2013-11-20 DIAGNOSIS — J069 Acute upper respiratory infection, unspecified: Secondary | ICD-10-CM | POA: Insufficient documentation

## 2013-11-20 DIAGNOSIS — Z8639 Personal history of other endocrine, nutritional and metabolic disease: Secondary | ICD-10-CM | POA: Insufficient documentation

## 2013-11-20 DIAGNOSIS — R599 Enlarged lymph nodes, unspecified: Secondary | ICD-10-CM | POA: Insufficient documentation

## 2013-11-20 DIAGNOSIS — Z79899 Other long term (current) drug therapy: Secondary | ICD-10-CM | POA: Insufficient documentation

## 2013-11-20 DIAGNOSIS — Z862 Personal history of diseases of the blood and blood-forming organs and certain disorders involving the immune mechanism: Secondary | ICD-10-CM | POA: Insufficient documentation

## 2013-11-20 DIAGNOSIS — I10 Essential (primary) hypertension: Secondary | ICD-10-CM | POA: Insufficient documentation

## 2013-11-20 DIAGNOSIS — Z85850 Personal history of malignant neoplasm of thyroid: Secondary | ICD-10-CM | POA: Insufficient documentation

## 2013-11-20 DIAGNOSIS — R591 Generalized enlarged lymph nodes: Secondary | ICD-10-CM

## 2013-11-20 MED ORDER — CETIRIZINE HCL 10 MG PO TABS: 10.0000 mg | ORAL_TABLET | Freq: Every day | ORAL | Status: DC

## 2013-11-20 NOTE — ED Notes (Signed)
Pt presents to department for evaluation of "knot" behind L ear. Onset this morning. 8/10 pain at the time. Pt is alert and oriented x4. NAD.

## 2013-11-20 NOTE — ED Provider Notes (Signed)
CSN: [id number]     Arrival date & time 11/20/13  1037 History  This chart was scribed for non-physician practitioner, Lorre Munroe, working with Sharyon Cable, MD by Lowella Petties, ED Scribe. The patient was seen in room TR08C/TR08C. Patient's care was started at 11:33 AM.     Chief Complaint  Patient presents with  . Pain   The history is provided by the patient. No language interpreter was used.   HPI Comments: Jordan Harvey is a 48 y.o. female who presents to the Emergency Department with a chief complaint of lymphadenopathy, and rhinorrhea. Patient states that she's been feeling under the weather for the past couple of days. She states that she noticed some pain on palpation of her lymph nodes couple days ago as well. She describes pain is 8/10 with palpation. She has not tried taking anything to alleviate her symptoms. There are no alleviating factors.  Past Medical History  Diagnosis Date  . Thyroid disease   . Hypertension   . Cancer of thyroid    Past Surgical History  Procedure Laterality Date  . Eye surgery    . Thyroid surgery  2008    Not sure what they did  . Tubal ligation  1991   Family History  Problem Relation Age of Onset  . Breast cancer Mother    History  Substance Use Topics  . Smoking status: Current Every Day Smoker -- 0.33 packs/day    Types: Cigarettes  . Smokeless tobacco: Not on file  . Alcohol Use: No   OB History   Grav Para Term Preterm Abortions TAB SAB Ect Mult Living                 Review of Systems  Constitutional: Negative for fever and chills.  HENT: Positive for postnasal drip, rhinorrhea and sore throat.   Respiratory: Positive for cough. Negative for shortness of breath.   Cardiovascular: Negative for chest pain.  Gastrointestinal: Negative for nausea, vomiting, diarrhea and constipation.  Genitourinary: Negative for dysuria.      Allergies  Bee venom  Home Medications   Prior to Admission medications    Medication Sig Start Date End Date Taking? Authorizing Provider  hydrochlorothiazide (HYDRODIURIL) 25 MG tablet Take 1 tablet (25 mg total) by mouth daily. 01/24/13   Antonietta Breach, PA-C  lisinopril (PRINIVIL,ZESTRIL) 10 MG tablet Take 1 tablet (10 mg total) by mouth daily. 01/24/13   Antonietta Breach, PA-C   BP 168/132  Pulse 83  Temp(Src) 97.7 F (36.5 C) (Oral)  Resp 22  SpO2 100% Physical Exam  Nursing note and vitals reviewed. Constitutional: She is oriented to person, place, and time. She appears well-developed and well-nourished. No distress.  HENT:  Head: Normocephalic and atraumatic.  Right Ear: External ear normal.  Left Ear: External ear normal.  Nose: Nose normal.  Mouth/Throat: Oropharynx is clear and moist. No oropharyngeal exudate.  Bilateral tympanic membranes are clear, oropharynx clear  Eyes: Conjunctivae and EOM are normal. Pupils are equal, round, and reactive to light.  Neck: Normal range of motion. Neck supple. No tracheal deviation present.  Cardiovascular: Normal rate and regular rhythm.  Exam reveals no gallop and no friction rub.   No murmur heard. Pulmonary/Chest: Effort normal and breath sounds normal. No respiratory distress. She has no wheezes. She has no rales. She exhibits no tenderness.  Abdominal: Soft. Bowel sounds are normal. She exhibits no distension and no mass. There is no tenderness. There is no rebound and  no guarding.  Musculoskeletal: Normal range of motion. She exhibits no edema and no tenderness.  Neurological: She is alert and oriented to person, place, and time.  Skin: Skin is warm and dry.  Psychiatric: She has a normal mood and affect. Her behavior is normal. Judgment and thought content normal.    ED Course  Procedures (including critical care time) DIAGNOSTIC STUDIES: Oxygen Saturation is 100% on room air, normal by my interpretation.     Labs Review Labs Reviewed - No data to display  Imaging Review No results found.   EKG  Interpretation None      MDM   Final diagnoses:  URI (upper respiratory infection)  Lymphadenopathy   patient with sore throat, runny nose, dry cough. Also complains of submandibular and cervical chain lymph node swelling and pain. Patients symptoms are consistent with URI, likely viral etiology. Discussed that antibiotics are not indicated for viral infections. Pt will be discharged with symptomatic treatment.  Verbalizes understanding and is agreeable with plan. Pt is hemodynamically stable & in NAD prior to dc.  Recommend followup with PCP if symptoms do not improve. Recommend observation of the swollen lymph nodes, but suspect these are secondary to URI.  Additionally, patient's blood pressure is high, she knows this, and states that she did not take her blood pressure medications this morning. She states that she will get it refilled today when she leaves, and will continue taking it.  I personally performed the services described in this documentation, which was scribed in my presence. The recorded information has been reviewed and is accurate.     Montine Circle, PA-C 11/20/13 1233

## 2013-11-20 NOTE — ED Notes (Signed)
Patient declined wheelchair at discharge.  RN escorted patient to lobby.

## 2013-11-20 NOTE — Discharge Instructions (Signed)
You need to refill your blood pressure medication and resume taking it.  If the lymph node does not calm down or gets worse in a week, you will need to follow-up with your Primary Care Provider.  Lymphadenopathy Lymphadenopathy means "disease of the lymph glands." But the term is usually used to describe swollen or enlarged lymph glands, also called lymph nodes. These are the bean-shaped organs found in many locations including the neck, underarm, and groin. Lymph glands are part of the immune system, which fights infections in your body. Lymphadenopathy can occur in just one area of the body, such as the neck, or it can be generalized, with lymph node enlargement in several areas. The nodes found in the neck are the most common sites of lymphadenopathy. CAUSES  When your immune system responds to germs (such as viruses or bacteria ), infection-fighting cells and fluid build up. This causes the glands to grow in size. This is usually not something to worry about. Sometimes, the glands themselves can become infected and inflamed. This is called lymphadenitis. Enlarged lymph nodes can be caused by many diseases:  Bacterial disease, such as strep throat or a skin infection.  Viral disease, such as a common cold.  Other germs, such as lyme disease, tuberculosis, or sexually transmitted diseases.  Cancers, such as lymphoma (cancer of the lymphatic system) or leukemia (cancer of the white blood cells).  Inflammatory diseases such as lupus or rheumatoid arthritis.  Reactions to medications. Many of the diseases above are rare, but important. This is why you should see your caregiver if you have lymphadenopathy. SYMPTOMS   Swollen, enlarged lumps in the neck, back of the head or other locations.  Tenderness.  Warmth or redness of the skin over the lymph nodes.  Fever. DIAGNOSIS  Enlarged lymph nodes are often near the source of infection. They can help healthcare providers diagnose your  illness. For instance:   Swollen lymph nodes around the jaw might be caused by an infection in the mouth.  Enlarged glands in the neck often signal a throat infection.  Lymph nodes that are swollen in more than one area often indicate an illness caused by a virus. Your caregiver most likely will know what is causing your lymphadenopathy after listening to your history and examining you. Blood tests, x-rays or other tests may be needed. If the cause of the enlarged lymph node cannot be found, and it does not go away by itself, then a biopsy may be needed. Your caregiver will discuss this with you. TREATMENT  Treatment for your enlarged lymph nodes will depend on the cause. Many times the nodes will shrink to normal size by themselves, with no treatment. Antibiotics or other medicines may be needed for infection. Only take over-the-counter or prescription medicines for pain, discomfort or fever as directed by your caregiver. HOME CARE INSTRUCTIONS  Swollen lymph glands usually return to normal when the underlying medical condition goes away. If they persist, contact your health-care provider. He/she might prescribe antibiotics or other treatments, depending on the diagnosis. Take any medications exactly as prescribed. Keep any follow-up appointments made to check on the condition of your enlarged nodes.  SEEK MEDICAL CARE IF:   Swelling lasts for more than two weeks.  You have symptoms such as weight loss, night sweats, fatigue or fever that does not go away.  The lymph nodes are hard, seem fixed to the skin or are growing rapidly.  Skin over the lymph nodes is red and inflamed. This  could mean there is an infection. SEEK IMMEDIATE MEDICAL CARE IF:   Fluid starts leaking from the area of the enlarged lymph node.  You develop a fever of 102 F (38.9 C) or greater.  Severe pain develops (not necessarily at the site of a large lymph node).  You develop chest pain or shortness of  breath.  You develop worsening abdominal pain. MAKE SURE YOU:   Understand these instructions.  Will watch your condition.  Will get help right away if you are not doing well or get worse. Document Released: 03/02/2008 Document Revised: 08/16/2011 Document Reviewed: 03/02/2008 [facility] Patient Information 2014 [place]. Upper Respiratory Infection, Adult An upper respiratory infection (URI) is also known as the common cold. It is often caused by a type of germ (virus). Colds are easily spread (contagious). You can pass it to others by kissing, coughing, sneezing, or drinking out of the same glass. Usually, you get better in 1 or 2 weeks.  HOME CARE   Only take medicine as told by your doctor.  Use a warm mist humidifier or breathe in steam from a hot shower.  Drink enough water and fluids to keep your pee (urine) clear or pale yellow.  Get plenty of rest.  Return to work when your temperature is back to normal or as told by your doctor. You may use a face mask and wash your hands to stop your cold from spreading. GET HELP RIGHT AWAY IF:   After the first few days, you feel you are getting worse.  You have questions about your medicine.  You have chills, shortness of breath, or brown or red spit (mucus).  You have yellow or brown snot (nasal discharge) or pain in the face, especially when you bend forward.  You have a fever, puffy (swollen) neck, pain when you swallow, or white spots in the back of your throat.  You have a bad headache, ear pain, sinus pain, or chest pain.  You have a high-pitched whistling sound when you breathe in and out (wheezing).  You have a lasting cough or cough up blood.  You have sore muscles or a stiff neck. MAKE SURE YOU:   Understand these instructions.  Will watch your condition.  Will get help right away if you are not doing well or get worse. Document Released: 11/10/2007 Document Revised: 08/16/2011 Document Reviewed:  09/28/2010 [facility] Patient Information 2014 [place], [place].

## 2013-11-21 NOTE — ED Provider Notes (Signed)
Medical screening examination/treatment/procedure(s) were performed by non-physician practitioner and as supervising physician I was immediately available for consultation/collaboration.   EKG Interpretation None        Sharyon Cable, MD 11/21/13 2013

## 2014-03-02 ENCOUNTER — Encounter (HOSPITAL_COMMUNITY): Payer: Self-pay | Admitting: Emergency Medicine

## 2014-03-02 ENCOUNTER — Emergency Department (HOSPITAL_COMMUNITY): Payer: Medicare Other

## 2014-03-02 ENCOUNTER — Emergency Department (HOSPITAL_COMMUNITY)
Admission: EM | Admit: 2014-03-02 | Discharge: 2014-03-02 | Disposition: A | Discharge status: Home / Self Care | Payer: Medicare Other | Attending: Emergency Medicine | Admitting: Emergency Medicine

## 2014-03-02 DIAGNOSIS — R109 Unspecified abdominal pain: Secondary | ICD-10-CM

## 2014-03-02 DIAGNOSIS — Z79899 Other long term (current) drug therapy: Secondary | ICD-10-CM | POA: Diagnosis not present

## 2014-03-02 DIAGNOSIS — Z85850 Personal history of malignant neoplasm of thyroid: Secondary | ICD-10-CM | POA: Insufficient documentation

## 2014-03-02 DIAGNOSIS — K5289 Other specified noninfective gastroenteritis and colitis: Secondary | ICD-10-CM | POA: Diagnosis not present

## 2014-03-02 DIAGNOSIS — I10 Essential (primary) hypertension: Secondary | ICD-10-CM | POA: Diagnosis not present

## 2014-03-02 DIAGNOSIS — Z3202 Encounter for pregnancy test, result negative: Secondary | ICD-10-CM | POA: Diagnosis not present

## 2014-03-02 DIAGNOSIS — Z9851 Tubal ligation status: Secondary | ICD-10-CM | POA: Diagnosis not present

## 2014-03-02 DIAGNOSIS — K529 Noninfective gastroenteritis and colitis, unspecified: Secondary | ICD-10-CM

## 2014-03-02 DIAGNOSIS — E079 Disorder of thyroid, unspecified: Secondary | ICD-10-CM | POA: Insufficient documentation

## 2014-03-02 DIAGNOSIS — F17200 Nicotine dependence, unspecified, uncomplicated: Secondary | ICD-10-CM | POA: Diagnosis not present

## 2014-03-02 DIAGNOSIS — R1033 Periumbilical pain: Secondary | ICD-10-CM | POA: Diagnosis present

## 2014-03-02 LAB — COMPREHENSIVE METABOLIC PANEL
ALBUMIN: 3.4 g/dL — AB (ref 3.5–5.2)
ALT: 26 U/L (ref 0–35)
ANION GAP: 11 (ref 5–15)
AST: 25 U/L (ref 0–37)
Alkaline Phosphatase: 67 U/L (ref 39–117)
BUN: 10 mg/dL (ref 6–23)
CO2: 26 mEq/L (ref 19–32)
CREATININE: 0.91 mg/dL (ref 0.50–1.10)
Calcium: 8.9 mg/dL (ref 8.4–10.5)
Chloride: 102 mEq/L (ref 96–112)
GFR calc Af Amer: 85 mL/min — ABNORMAL LOW (ref >90)
GFR calc non Af Amer: 73 mL/min — ABNORMAL LOW (ref >90)
Glucose, Bld: 89 mg/dL (ref 70–99)
Potassium: 3.7 mEq/L (ref 3.7–5.3)
Sodium: 139 mEq/L (ref 137–147)
TOTAL PROTEIN: 7.4 g/dL (ref 6.0–8.3)
Total Bilirubin: 0.5 mg/dL (ref 0.3–1.2)

## 2014-03-02 LAB — CBC WITH DIFFERENTIAL/PLATELET
BASOS PCT: 0 % (ref 0–1)
Basophils Absolute: 0 10*3/uL (ref 0.0–0.1)
EOS ABS: 0.1 10*3/uL (ref 0.0–0.7)
Eosinophils Relative: 1 % (ref 0–5)
HEMATOCRIT: 35.4 % — AB (ref 36.0–46.0)
HEMOGLOBIN: 11.9 g/dL — AB (ref 12.0–15.0)
Lymphocytes Relative: 14 % (ref 12–46)
Lymphs Abs: 1.3 10*3/uL (ref 0.7–4.0)
MCH: 31.3 pg (ref 26.0–34.0)
MCHC: 33.6 g/dL (ref 30.0–36.0)
MCV: 93.2 fL (ref 78.0–100.0)
MONO ABS: 0.3 10*3/uL (ref 0.1–1.0)
MONOS PCT: 4 % (ref 3–12)
NEUTROS PCT: 81 % — AB (ref 43–77)
Neutro Abs: 7.4 10*3/uL (ref 1.7–7.7)
Platelets: 249 10*3/uL (ref 150–400)
RBC: 3.8 MIL/uL — AB (ref 3.87–5.11)
RDW: 15.1 % (ref 11.5–15.5)
WBC: 9.1 10*3/uL (ref 4.0–10.5)

## 2014-03-02 LAB — PREGNANCY, URINE: PREG TEST UR: NEGATIVE

## 2014-03-02 LAB — URINALYSIS, ROUTINE W REFLEX MICROSCOPIC
Bilirubin Urine: NEGATIVE
Glucose, UA: NEGATIVE mg/dL
Hgb urine dipstick: NEGATIVE
Ketones, ur: NEGATIVE mg/dL
LEUKOCYTES UA: NEGATIVE
NITRITE: NEGATIVE
PH: 6 (ref 5.0–8.0)
Protein, ur: NEGATIVE mg/dL
SPECIFIC GRAVITY, URINE: 1.02 (ref 1.005–1.030)
Urobilinogen, UA: 0.2 mg/dL (ref 0.0–1.0)

## 2014-03-02 LAB — LIPASE, BLOOD: Lipase: 33 U/L (ref 11–59)

## 2014-03-02 MED ORDER — SODIUM CHLORIDE 0.9 % IV SOLN
Freq: Once | INTRAVENOUS | Status: AC
  Administered 2014-03-02: 11:00:00 via INTRAVENOUS

## 2014-03-02 MED ORDER — MORPHINE SULFATE 4 MG/ML IJ SOLN
4.0000 mg | INTRAMUSCULAR | Status: DC | PRN
  Administered 2014-03-02: 4 mg via INTRAVENOUS
  Filled 2014-03-02: qty 1

## 2014-03-02 MED ORDER — ONDANSETRON HCL 4 MG/2ML IJ SOLN
4.0000 mg | Freq: Once | INTRAMUSCULAR | Status: AC
  Administered 2014-03-02: 4 mg via INTRAVENOUS
  Filled 2014-03-02: qty 2

## 2014-03-02 MED ORDER — IOHEXOL 300 MG/ML  SOLN
25.0000 mL | Freq: Once | INTRAMUSCULAR | Status: AC | PRN
  Administered 2014-03-02: 25 mL via ORAL

## 2014-03-02 MED ORDER — ONDANSETRON 4 MG PO TBDP: 4.0000 mg | ORAL_TABLET | Freq: Three times a day (TID) | ORAL | Status: DC | PRN

## 2014-03-02 MED ORDER — IOHEXOL 300 MG/ML  SOLN
100.0000 mL | Freq: Once | INTRAMUSCULAR | Status: AC | PRN
  Administered 2014-03-02: 100 mL via INTRAVENOUS

## 2014-03-02 MED ORDER — SODIUM CHLORIDE 0.9 % IV BOLUS (SEPSIS)
500.0000 mL | Freq: Once | INTRAVENOUS | Status: AC
  Administered 2014-03-02: 500 mL via INTRAVENOUS

## 2014-03-02 MED ORDER — DICYCLOMINE HCL 20 MG PO TABS: 20.0000 mg | ORAL_TABLET | Freq: Two times a day (BID) | ORAL | Status: DC

## 2014-03-02 NOTE — Discharge Instructions (Signed)
Abdominal Pain, Women °Abdominal (stomach, pelvic, or belly) pain can be caused by many things. It is important to tell your doctor: °· The location of the pain. °· Does it come and go or is it present all the time? °· Are there things that start the pain (eating certain foods, exercise)? °· Are there other symptoms associated with the pain (fever, nausea, vomiting, diarrhea)? °All of this is helpful to know when trying to find the cause of the pain. °CAUSES  °· Stomach: virus or bacteria infection, or ulcer. °· Intestine: appendicitis (inflamed appendix), regional ileitis (Crohn's disease), ulcerative colitis (inflamed colon), irritable bowel syndrome, diverticulitis (inflamed diverticulum of the colon), or cancer of the stomach or intestine. °· Gallbladder disease or stones in the gallbladder. °· Kidney disease, kidney stones, or infection. °· Pancreas infection or cancer. °· Fibromyalgia (pain disorder). °· Diseases of the female organs: °¨ Uterus: fibroid (non-cancerous) tumors or infection. °¨ Fallopian tubes: infection or tubal pregnancy. °¨ Ovary: cysts or tumors. °¨ Pelvic adhesions (scar tissue). °¨ Endometriosis (uterus lining tissue growing in the pelvis and on the pelvic organs). °¨ Pelvic congestion syndrome (female organs filling up with blood just before the menstrual period). °¨ Pain with the menstrual period. °¨ Pain with ovulation (producing an egg). °¨ Pain with an IUD (intrauterine device, birth control) in the uterus. °¨ Cancer of the female organs. °· Functional pain (pain not caused by a disease, may improve without treatment). °· Psychological pain. °· Depression. °DIAGNOSIS  °Your doctor will decide the seriousness of your pain by doing an examination. °· Blood tests. °· X-rays. °· Ultrasound. °· CT scan (computed tomography, special type of X-ray). °· MRI (magnetic resonance imaging). °· Cultures, for infection. °· Barium enema (dye inserted in the large intestine, to better view it with  X-rays). °· Colonoscopy (looking in intestine with a lighted tube). °· Laparoscopy (minor surgery, looking in abdomen with a lighted tube). °· Major abdominal exploratory surgery (looking in abdomen with a large incision). °TREATMENT  °The treatment will depend on the cause of the pain.  °· Many cases can be observed and treated at home. °· Over-the-counter medicines recommended by your caregiver. °· Prescription medicine. °· Antibiotics, for infection. °· Birth control pills, for painful periods or for ovulation pain. °· Hormone treatment, for endometriosis. °· Nerve blocking injections. °· Physical therapy. °· Antidepressants. °· Counseling with a psychologist or psychiatrist. °· Minor or major surgery. °HOME CARE INSTRUCTIONS  °· Do not take laxatives, unless directed by your caregiver. °· Take over-the-counter pain medicine only if ordered by your caregiver. Do not take aspirin because it can cause an upset stomach or bleeding. °· Try a clear liquid diet (broth or water) as ordered by your caregiver. Slowly move to a bland diet, as tolerated, if the pain is related to the stomach or intestine. °· Have a thermometer and take your temperature several times a day, and record it. °· Bed rest and sleep, if it helps the pain. °· Avoid sexual intercourse, if it causes pain. °· Avoid stressful situations. °· Keep your follow-up appointments and tests, as your caregiver orders. °· If the pain does not go away with medicine or surgery, you may try: °¨ Acupuncture. °¨ Relaxation exercises (yoga, meditation). °¨ Group therapy. °¨ Counseling. °SEEK MEDICAL CARE IF:  °· You notice certain foods cause stomach pain. °· Your home care treatment is not helping your pain. °· You need stronger pain medicine. °· You want your IUD removed. °· You feel faint or   lightheaded. °· You develop nausea and vomiting. °· You develop a rash. °· You are having side effects or an allergy to your medicine. °SEEK IMMEDIATE MEDICAL CARE IF:  °· Your  pain does not go away or gets worse. °· You have a fever. °· Your pain is felt only in portions of the abdomen. The right side could possibly be appendicitis. The left lower portion of the abdomen could be colitis or diverticulitis. °· You are passing blood in your stools (bright red or black tarry stools, with or without vomiting). °· You have blood in your urine. °· You develop chills, with or without a fever. °· You pass out. °MAKE SURE YOU:  °· Understand these instructions. °· Will watch your condition. °· Will get help right away if you are not doing well or get worse. °Document Released: 03/21/2007 Document Revised: 10/08/2013 Document Reviewed: 04/10/2009 °[employer]® Patient Information ©2015 [employer]. This information is not intended to replace advice given to you by your health care provider. Make sure you discuss any questions you have with your health care provider. ° °

## 2014-03-02 NOTE — ED Notes (Signed)
Pt from home with c/o generalized abdominal pain x 3 days.  Pt reports being constipated and the having "pudding" for a bowel movement today following taking pepto-bismal.  Pt in NAD, A&O.

## 2014-03-02 NOTE — ED Notes (Signed)
Patient transported to CT 

## 2014-03-02 NOTE — ED Notes (Signed)
Observed pt ambulate with ease to the room, change into a gown and ambulate to restroom.

## 2014-03-03 NOTE — ED Provider Notes (Signed)
CSN: [id number]     Arrival date & time 03/02/14  0940 History   First MD Initiated Contact with Patient 03/02/14 1001     Chief Complaint  Patient presents with  . Abdominal Pain      HPI  Patient presents abdominal pain over the last few days. Pain is periumbilical. Not localizing more left or right. States at times she'll have a poor appetite but is not nauseated today. States she was constipated and  Pepto-Bismol and had a "pudding-like" bowel movement this morning. No blood. It may have relieved her symptoms but not completely.  Past Medical History  Diagnosis Date  . Thyroid disease   . Hypertension   . Cancer of thyroid    Past Surgical History  Procedure Laterality Date  . Eye surgery    . Thyroid surgery  2008    Not sure what they did  . Tubal ligation  1991   Family History  Problem Relation Age of Onset  . Breast cancer Mother    History  Substance Use Topics  . Smoking status: Light Tobacco Smoker -- 0.01 packs/day    Types: Cigarettes  . Smokeless tobacco: Not on file  . Alcohol Use: No   OB History   Grav Para Term Preterm Abortions TAB SAB Ect Mult Living                 Review of Systems  Constitutional: Negative for fever, chills, diaphoresis, appetite change and fatigue.  HENT: Negative for mouth sores, sore throat and trouble swallowing.   Eyes: Negative for visual disturbance.  Respiratory: Negative for cough, chest tightness, shortness of breath and wheezing.   Cardiovascular: Negative for chest pain.  Gastrointestinal: Positive for abdominal pain and constipation. Negative for nausea, vomiting, diarrhea and abdominal distention.  Endocrine: Negative for polydipsia, polyphagia and polyuria.  Genitourinary: Negative for dysuria, frequency and hematuria.  Musculoskeletal: Negative for gait problem.  Skin: Negative for color change, pallor and rash.  Neurological: Negative for dizziness, syncope, light-headedness and headaches.  Hematological:  Does not bruise/bleed easily.  Psychiatric/Behavioral: Negative for behavioral problems and confusion.      Allergies  Bee venom  Home Medications   Prior to Admission medications   Medication Sig Start Date End Date Taking? Authorizing Provider  cetirizine (ZYRTEC ALLERGY) 10 MG tablet Take 1 tablet (10 mg total) by mouth daily. 11/20/13  Yes Montine Circle, PA-C  hydrochlorothiazide (HYDRODIURIL) 25 MG tablet Take 1 tablet (25 mg total) by mouth daily. 01/24/13  Yes Antonietta Breach, PA-C  levothyroxine (SYNTHROID, LEVOTHROID) 25 MCG tablet Take 25 mcg by mouth daily before breakfast.   Yes Historical Provider, MD  lisinopril (PRINIVIL,ZESTRIL) 10 MG tablet Take 1 tablet (10 mg total) by mouth daily. 01/24/13  Yes Antonietta Breach, PA-C  dicyclomine (BENTYL) 20 MG tablet Take 1 tablet (20 mg total) by mouth 2 (two) times daily. 03/02/14   Tanna Furry, MD  ondansetron (ZOFRAN ODT) 4 MG disintegrating tablet Take 1 tablet (4 mg total) by mouth every 8 (eight) hours as needed for nausea. 03/02/14   Tanna Furry, MD   BP 136/95  Pulse 57  Temp(Src) 98 F (36.7 C) (Oral)  Resp 17  SpO2 99%  LMP 02/20/2014 Physical Exam  Constitutional: She is oriented to person, place, and time. She appears well-developed and well-nourished. No distress.  HENT:  Head: Normocephalic.  Eyes: Conjunctivae are normal. Pupils are equal, round, and reactive to light. No scleral icterus.  Neck: Normal range of motion.  Neck supple. No thyromegaly present.  Cardiovascular: Normal rate and regular rhythm.  Exam reveals no gallop and no friction rub.   No murmur heard. Pulmonary/Chest: Effort normal and breath sounds normal. No respiratory distress. She has no wheezes. She has no rales.  Abdominal: Soft. Bowel sounds are normal. She exhibits no distension. There is no tenderness. There is no rebound.    Mild periumbilical tenderness. No peritoneal irritation. Normal active bowel sounds.  Musculoskeletal: Normal range of  motion.  Neurological: She is alert and oriented to person, place, and time.  Skin: Skin is warm and dry. No rash noted.  Psychiatric: She has a normal mood and affect. Her behavior is normal.    ED Course  Procedures (including critical care time) Labs Review Labs Reviewed  CBC WITH DIFFERENTIAL - Abnormal; Notable for the following:    RBC 3.80 (*)    Hemoglobin 11.9 (*)    HCT 35.4 (*)    Neutrophils Relative % 81 (*)    All other components within normal limits  COMPREHENSIVE METABOLIC PANEL - Abnormal; Notable for the following:    Albumin 3.4 (*)    GFR calc non Af Amer 73 (*)    GFR calc Af Amer 85 (*)    All other components within normal limits  URINALYSIS, ROUTINE W REFLEX MICROSCOPIC - Abnormal; Notable for the following:    APPearance HAZY (*)    All other components within normal limits  LIPASE, BLOOD  PREGNANCY, URINE    Imaging Review Ct Abdomen Pelvis W Contrast  03/02/2014   CLINICAL DATA:  Central abdominal pain and soreness, past history hypertension, thyroid cancer and tubal ligation  EXAM: CT ABDOMEN AND PELVIS WITH CONTRAST  TECHNIQUE: Multidetector CT imaging of the abdomen and pelvis was performed using the standard protocol following bolus administration of intravenous contrast. Sagittal and coronal MPR images reconstructed from axial data set.  CONTRAST:  178mL OMNIPAQUE IOHEXOL 300 MG/ML SOLN IV. Dilute oral contrast.  COMPARISON:  None  FINDINGS: Dependent atelectasis in both lower lobes.  Large intermediate attenuation RIGHT adrenal mass 4.9 x 3.9 cm, indeterminate, demonstrating no significant washout on delayed images.  Liver, spleen, pancreas, kidneys, and LEFT adrenal gland normal.  Normal appendix.  Unremarkable uterus, adnexae, bladder, and ureters.  Mild edema seen within mesenteric of several small bowel loops in the RIGHT lower quadrant though these loops do not appear significantly thickened  Findings suggest presence of an inflammatory process or  potentially subtle enteritis.  No abscess collection, free intraperitoneal fluid, free air, focal bowel wall thickening or bowel obstruction.  Stomach and remaining bowel loops normal appearance.  Scattered atherosclerotic disease changes.  Tiny umbilical hernia containing fat.  No mass, adenopathy or acute osseous findings.  IMPRESSION: Indeterminate RIGHT adrenal mass 3.9 x 4.9 cm ; characterization by MR imaging with and without contrast recommended.  Mild nonspecific stranding of mesenteric fat associated with few small bowel loops in the RIGHT pelvis, nonspecific, without definite associated bowel wall thickening ; findings favor a mild inflammatory process question enteritis.  No evidence of colitis or appendicitis identified and remainder of exam is unremarkable.   Electronically Signed   By: Lavonia Dana M.D.   On: 03/02/2014 13:26     EKG Interpretation None      MDM   Final diagnoses:  Abdominal pain, unspecified abdominal location  Enteritis    She remained comfortable in the emergency room. Results of CT discussed with patient including the adrenal mass and recommended followup.  This was given to her in written form as well encouraged to contact primary care for arrangement for this test. I do not think this has relation to her symptoms today. Has a small segment of mesenteric reaction described as stranding of mesenteric fat in the right pelvis. No bowel wall thickening. Her repeat exam is benign and she is hungry and almost demanding food. I think should appropriate for outpatient treatment with simple expectant management.    Tanna Furry, MD 03/03/14 [phone]

## 2014-09-17 ENCOUNTER — Encounter (HOSPITAL_COMMUNITY): Payer: Self-pay | Admitting: *Deleted

## 2014-09-17 ENCOUNTER — Emergency Department (HOSPITAL_COMMUNITY)
Admission: EM | Admit: 2014-09-17 | Discharge: 2014-09-17 | Disposition: A | Discharge status: Home / Self Care | Payer: Medicare Other | Attending: Emergency Medicine | Admitting: Emergency Medicine

## 2014-09-17 DIAGNOSIS — Y929 Unspecified place or not applicable: Secondary | ICD-10-CM | POA: Insufficient documentation

## 2014-09-17 DIAGNOSIS — Z79899 Other long term (current) drug therapy: Secondary | ICD-10-CM | POA: Insufficient documentation

## 2014-09-17 DIAGNOSIS — Z85850 Personal history of malignant neoplasm of thyroid: Secondary | ICD-10-CM | POA: Insufficient documentation

## 2014-09-17 DIAGNOSIS — Y939 Activity, unspecified: Secondary | ICD-10-CM | POA: Diagnosis not present

## 2014-09-17 DIAGNOSIS — I10 Essential (primary) hypertension: Secondary | ICD-10-CM | POA: Diagnosis not present

## 2014-09-17 DIAGNOSIS — E079 Disorder of thyroid, unspecified: Secondary | ICD-10-CM | POA: Insufficient documentation

## 2014-09-17 DIAGNOSIS — Y999 Unspecified external cause status: Secondary | ICD-10-CM | POA: Diagnosis not present

## 2014-09-17 DIAGNOSIS — T192XXA Foreign body in vulva and vagina, initial encounter: Secondary | ICD-10-CM | POA: Insufficient documentation

## 2014-09-17 DIAGNOSIS — Z720 Tobacco use: Secondary | ICD-10-CM | POA: Diagnosis not present

## 2014-09-17 DIAGNOSIS — X58XXXA Exposure to other specified factors, initial encounter: Secondary | ICD-10-CM | POA: Diagnosis not present

## 2014-09-17 NOTE — ED Notes (Signed)
Pt states that there is a dill pickle stuck in her vagina.

## 2014-09-17 NOTE — ED Provider Notes (Signed)
CSN: [id number]     Arrival date & time 09/17/14  0458 History   First MD Initiated Contact with Patient 09/17/14 [phone]     Chief Complaint  Patient presents with  . Foreign Body in Vagina     (Consider location/radiation/quality/duration/timing/severity/associated sxs/prior Treatment) HPI Comments: The patient is a 49 year old female who presents after placing a pickle in her vagina this evening, she states that the pedicle was inserted and she was unable to retrieve it. She denies any other complaints, symptoms are persistent, mild, no associated fevers or discharge  Patient is a 49 y.o. female presenting with foreign body in vagina. The history is provided by the patient.  Foreign Body in Vagina Pertinent negatives include no abdominal pain.    Past Medical History  Diagnosis Date  . Thyroid disease   . Hypertension   . Cancer of thyroid    Past Surgical History  Procedure Laterality Date  . Eye surgery    . Thyroid surgery  2008    Not sure what they did  . Tubal ligation  1991   Family History  Problem Relation Age of Onset  . Breast cancer Mother    History  Substance Use Topics  . Smoking status: Light Tobacco Smoker -- 0.01 packs/day    Types: Cigarettes  . Smokeless tobacco: Not on file  . Alcohol Use: No   OB History    No data available     Review of Systems  Constitutional: Negative for fever.  Gastrointestinal: Negative for abdominal pain.  Genitourinary: Negative for vaginal bleeding and vaginal discharge.      Allergies  Bee venom  Home Medications   Prior to Admission medications   Medication Sig Start Date End Date Taking? Authorizing Provider  cetirizine (ZYRTEC ALLERGY) 10 MG tablet Take 1 tablet (10 mg total) by mouth daily. 11/20/13   Montine Circle, PA-C  dicyclomine (BENTYL) 20 MG tablet Take 1 tablet (20 mg total) by mouth 2 (two) times daily. 03/02/14   Tanna Furry, MD  hydrochlorothiazide (HYDRODIURIL) 25 MG tablet Take 1 tablet (25  mg total) by mouth daily. 01/24/13   Antonietta Breach, PA-C  levothyroxine (SYNTHROID, LEVOTHROID) 25 MCG tablet Take 25 mcg by mouth daily before breakfast.    Historical Provider, MD  lisinopril (PRINIVIL,ZESTRIL) 10 MG tablet Take 1 tablet (10 mg total) by mouth daily. 01/24/13   Antonietta Breach, PA-C  ondansetron (ZOFRAN ODT) 4 MG disintegrating tablet Take 1 tablet (4 mg total) by mouth every 8 (eight) hours as needed for nausea. 03/02/14   Tanna Furry, MD   BP 163/102 mmHg  Pulse 88  Resp 16  Ht 5\' 3"  (1.6 m)  Wt 170 lb (77.111 kg)  BMI 30.12 kg/m2  SpO2 99%  LMP 03/21/2014 Physical Exam  Constitutional: She appears well-developed and well-nourished.  HENT:  Head: Normocephalic and atraumatic.  Eyes: Conjunctivae are normal. Right eye exhibits no discharge. Left eye exhibits no discharge.  Pulmonary/Chest: Effort normal. No respiratory distress.  Genitourinary:  Chaperone present for exam, normal-appearing external genitalia, speculum inserted and large occult present in the vaginal vault, no bleeding, see procedure note for pickle extraction  Neurological: She is alert. Coordination normal.  Skin: Skin is warm and dry. No rash noted. She is not diaphoretic. No erythema.  Psychiatric: She has a normal mood and affect.  Nursing note and vitals reviewed.   ED Course  FOREIGN BODY REMOVAL Date/Time: 09/17/2014 5:23 AM Performed by: Noemi Chapel Authorized by: Noemi Chapel Consent: Verbal  consent obtained. Risks and benefits: risks, benefits and alternatives were discussed Consent given by: patient Patient understanding: patient states understanding of the procedure being performed Required items: required blood products, implants, devices, and special equipment available Patient identity confirmed: verbally with patient Time out: Immediately prior to procedure a "time out" was called to verify the correct patient, procedure, equipment, support staff and site/side marked as  required. Body area: vagina Patient sedated: no Patient cooperative: yes Removal mechanism: ring forceps Complexity: simple 1 objects recovered. Objects recovered: Pickle Post-procedure assessment: foreign body removed Patient tolerance: Patient tolerated the procedure well with no immediate complications Comments: Entire foreign body removed without difficulty, no residual foreign body seen, no discharge, no drainage, no redness, no purulence, no bleeding   (including critical care time) Labs Review Labs Reviewed - No data to display  Imaging Review No results found.    MDM   Final diagnoses:  Foreign body in vagina, initial encounter    No signs of toxic shock, patient encouraged to douche, vital signs unremarkable, stable for discharge      Noemi Chapel, MD 09/17/14 [phone]

## 2014-09-17 NOTE — Discharge Instructions (Signed)
Please call your doctor for a followup appointment within 24-48 hours. When you talk to your doctor please let them know that you were seen in the emergency department and have them acquire all of your records so that they can discuss the findings with you and formulate a treatment plan to fully care for your new and ongoing problems. ° °

## 2014-12-18 ENCOUNTER — Emergency Department (HOSPITAL_COMMUNITY)
Admission: EM | Admit: 2014-12-18 | Discharge: 2014-12-18 | Disposition: A | Discharge status: Home / Self Care | Payer: Medicare Other | Attending: Emergency Medicine | Admitting: Emergency Medicine

## 2014-12-18 ENCOUNTER — Encounter (HOSPITAL_COMMUNITY): Payer: Self-pay | Admitting: Emergency Medicine

## 2014-12-18 DIAGNOSIS — M79605 Pain in left leg: Secondary | ICD-10-CM | POA: Diagnosis not present

## 2014-12-18 DIAGNOSIS — E079 Disorder of thyroid, unspecified: Secondary | ICD-10-CM | POA: Insufficient documentation

## 2014-12-18 DIAGNOSIS — Z720 Tobacco use: Secondary | ICD-10-CM | POA: Diagnosis not present

## 2014-12-18 DIAGNOSIS — G4700 Insomnia, unspecified: Secondary | ICD-10-CM | POA: Diagnosis not present

## 2014-12-18 DIAGNOSIS — Z79899 Other long term (current) drug therapy: Secondary | ICD-10-CM | POA: Diagnosis not present

## 2014-12-18 DIAGNOSIS — Z85850 Personal history of malignant neoplasm of thyroid: Secondary | ICD-10-CM | POA: Diagnosis not present

## 2014-12-18 DIAGNOSIS — I10 Essential (primary) hypertension: Secondary | ICD-10-CM | POA: Diagnosis not present

## 2014-12-18 DIAGNOSIS — F419 Anxiety disorder, unspecified: Secondary | ICD-10-CM | POA: Diagnosis present

## 2014-12-18 MED ORDER — HYDROCODONE-ACETAMINOPHEN 5-325 MG PO TABS: 1.0000 | ORAL_TABLET | Freq: Four times a day (QID) | ORAL | Status: DC | PRN

## 2014-12-18 NOTE — ED Notes (Signed)
Dr. Campos at bedside   
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
## 2014-12-18 NOTE — ED Notes (Signed)
Patient informed registration that she wanted to speak to someone about the wait and receiving medication. Eliberto Ivory, RN went out to the nurse first desk to speak with patient and no one was at the desk. Pt went back up to registration desk again reporting needing to speak with someone again. Writer went to the nurse first desk. Spoke with patient about the wait going off of severity and could not give her an exact time of getting into the room. Pt reported she was going to leave and see her PCP on Friday. She refused to sign. While attempting to take patient out of epic, she has decided to stay for treatment.
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
## 2015-07-19 ENCOUNTER — Emergency Department (HOSPITAL_COMMUNITY): Payer: Medicare Other

## 2015-07-19 ENCOUNTER — Emergency Department (HOSPITAL_COMMUNITY)
Admission: EM | Admit: 2015-07-19 | Discharge: 2015-07-19 | Disposition: A | Discharge status: Home / Self Care | Payer: Medicare Other | Attending: Emergency Medicine | Admitting: Emergency Medicine

## 2015-07-19 ENCOUNTER — Encounter (HOSPITAL_COMMUNITY): Payer: Self-pay

## 2015-07-19 DIAGNOSIS — R1033 Periumbilical pain: Secondary | ICD-10-CM

## 2015-07-19 DIAGNOSIS — F17210 Nicotine dependence, cigarettes, uncomplicated: Secondary | ICD-10-CM | POA: Diagnosis not present

## 2015-07-19 DIAGNOSIS — Z85850 Personal history of malignant neoplasm of thyroid: Secondary | ICD-10-CM | POA: Insufficient documentation

## 2015-07-19 DIAGNOSIS — Z8669 Personal history of other diseases of the nervous system and sense organs: Secondary | ICD-10-CM | POA: Diagnosis not present

## 2015-07-19 DIAGNOSIS — Z79899 Other long term (current) drug therapy: Secondary | ICD-10-CM | POA: Diagnosis not present

## 2015-07-19 DIAGNOSIS — E079 Disorder of thyroid, unspecified: Secondary | ICD-10-CM | POA: Diagnosis not present

## 2015-07-19 DIAGNOSIS — E279 Disorder of adrenal gland, unspecified: Secondary | ICD-10-CM | POA: Diagnosis not present

## 2015-07-19 DIAGNOSIS — I10 Essential (primary) hypertension: Secondary | ICD-10-CM | POA: Insufficient documentation

## 2015-07-19 HISTORY — DX: Other vitreous opacities, left eye: H43.392

## 2015-07-19 LAB — CBC WITH DIFFERENTIAL/PLATELET
Basophils Absolute: 0.1 K/uL (ref 0.0–0.1)
Basophils Relative: 1 %
Eosinophils Absolute: 0.1 K/uL (ref 0.0–0.7)
Eosinophils Relative: 2 %
HCT: 39.8 % (ref 36.0–46.0)
Hemoglobin: 13.1 g/dL (ref 12.0–15.0)
Lymphocytes Relative: 29 %
Lymphs Abs: 2.4 K/uL (ref 0.7–4.0)
MCH: 29.7 pg (ref 26.0–34.0)
MCHC: 32.9 g/dL (ref 30.0–36.0)
MCV: 90.2 fL (ref 78.0–100.0)
Monocytes Absolute: 0.4 K/uL (ref 0.1–1.0)
Monocytes Relative: 5 %
Neutro Abs: 5.3 K/uL (ref 1.7–7.7)
Neutrophils Relative %: 63 %
Platelets: 283 K/uL (ref 150–400)
RBC: 4.41 MIL/uL (ref 3.87–5.11)
RDW: 14.1 % (ref 11.5–15.5)
WBC: 8.3 K/uL (ref 4.0–10.5)

## 2015-07-19 LAB — COMPREHENSIVE METABOLIC PANEL WITH GFR
ALT: 12 U/L — ABNORMAL LOW (ref 14–54)
AST: 18 U/L (ref 15–41)
Albumin: 3.8 g/dL (ref 3.5–5.0)
Alkaline Phosphatase: 84 U/L (ref 38–126)
Anion gap: 9 (ref 5–15)
BUN: 15 mg/dL (ref 6–20)
CO2: 21 mmol/L — ABNORMAL LOW (ref 22–32)
Calcium: 9.1 mg/dL (ref 8.9–10.3)
Chloride: 109 mmol/L (ref 101–111)
Creatinine, Ser: 1.01 mg/dL — ABNORMAL HIGH (ref 0.44–1.00)
GFR calc Af Amer: >60 mL/min
GFR calc non Af Amer: >60 mL/min
Glucose, Bld: 95 mg/dL (ref 65–99)
Potassium: 3.9 mmol/L (ref 3.5–5.1)
Sodium: 139 mmol/L (ref 135–145)
Total Bilirubin: 0.9 mg/dL (ref 0.3–1.2)
Total Protein: 7.4 g/dL (ref 6.5–8.1)

## 2015-07-19 LAB — TROPONIN I: Troponin I: <0.03 ng/mL

## 2015-07-19 LAB — LIPASE, BLOOD: Lipase: 39 U/L (ref 11–51)

## 2015-07-19 MED ORDER — METOCLOPRAMIDE HCL 5 MG/ML IJ SOLN
10.0000 mg | Freq: Once | INTRAMUSCULAR | Status: AC
  Administered 2015-07-19: 10 mg via INTRAVENOUS
  Filled 2015-07-19: qty 2

## 2015-07-19 MED ORDER — DIPHENHYDRAMINE HCL 50 MG/ML IJ SOLN
12.5000 mg | Freq: Once | INTRAMUSCULAR | Status: AC
  Administered 2015-07-19: 12.5 mg via INTRAVENOUS
  Filled 2015-07-19: qty 1

## 2015-07-19 MED ORDER — SODIUM CHLORIDE 0.9 % IV BOLUS (SEPSIS)
500.0000 mL | Freq: Once | INTRAVENOUS | Status: AC
  Administered 2015-07-19: 500 mL via INTRAVENOUS

## 2015-07-19 MED ORDER — ACETAMINOPHEN-CODEINE #3 300-30 MG PO TABS: 1.0000 | ORAL_TABLET | Freq: Four times a day (QID) | ORAL | Status: DC | PRN

## 2015-07-19 MED ORDER — TETRACAINE HCL 0.5 % OP SOLN
2.0000 [drp] | Freq: Once | OPHTHALMIC | Status: AC
  Administered 2015-07-19: 2 [drp] via OPHTHALMIC
  Filled 2015-07-19: qty 4

## 2015-07-19 MED ORDER — DEXAMETHASONE SODIUM PHOSPHATE 10 MG/ML IJ SOLN
10.0000 mg | Freq: Once | INTRAMUSCULAR | Status: AC
  Administered 2015-07-19: 10 mg via INTRAVENOUS
  Filled 2015-07-19: qty 1

## 2015-07-19 MED ORDER — KETOROLAC TROMETHAMINE 30 MG/ML IJ SOLN
30.0000 mg | Freq: Once | INTRAMUSCULAR | Status: AC
  Administered 2015-07-19: 30 mg via INTRAVENOUS
  Filled 2015-07-19: qty 1

## 2015-07-19 MED ORDER — GADOBENATE DIMEGLUMINE 529 MG/ML IV SOLN
20.0000 mL | Freq: Once | INTRAVENOUS | Status: AC | PRN
  Administered 2015-07-19: 16 mL via INTRAVENOUS
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
## 2015-07-19 NOTE — ED Notes (Signed)
She independently and capably ambulates to b.r. And back.

## 2015-07-19 NOTE — ED Notes (Signed)
Patient in MRI 

## 2015-07-19 NOTE — Discharge Instructions (Signed)
You have a benign adrenal tumor that has not changed.  Please follow up with your eye doctor as previously scheduled for further management of your eye symptoms.  Take ultram as needed for pain.  Follow up with your primary care provider for further care.  Abdominal Pain, Adult Many things can cause abdominal pain. Usually, abdominal pain is not caused by a disease and will improve without treatment. It can often be observed and treated at home. Your health care provider will do a physical exam and possibly order blood tests and X-rays to help determine the seriousness of your pain. However, in many cases, more time must pass before a clear cause of the pain can be found. Before that point, your health care provider may not know if you need more testing or further treatment. HOME CARE INSTRUCTIONS Monitor your abdominal pain for any changes. The following actions may help to alleviate any discomfort you are experiencing:  Only take over-the-counter or prescription medicines as directed by your health care provider.  Do not take laxatives unless directed to do so by your health care provider.  Try a clear liquid diet (broth, tea, or water) as directed by your health care provider. Slowly move to a bland diet as tolerated. SEEK MEDICAL CARE IF:  You have unexplained abdominal pain.  You have abdominal pain associated with nausea or diarrhea.  You have pain when you urinate or have a bowel movement.  You experience abdominal pain that wakes you in the night.  You have abdominal pain that is worsened or improved by eating food.  You have abdominal pain that is worsened with eating fatty foods.  You have a fever. SEEK IMMEDIATE MEDICAL CARE IF:  Your pain does not go away within 2 hours.  You keep throwing up (vomiting).  Your pain is felt only in portions of the abdomen, such as the right side or the left lower portion of the abdomen.  You pass bloody or black tarry stools. MAKE SURE  YOU:  Understand these instructions.  Will watch your condition.  Will get help right away if you are not doing well or get worse.   This information is not intended to replace advice given to you by your health care provider. Make sure you discuss any questions you have with your health care provider.   Document Released: 03/03/2005 Document Revised: 02/12/2015 Document Reviewed: 01/31/2013 Elsevier Interactive Patient Education [employer].

## 2015-07-19 NOTE — ED Notes (Signed)
Bed: WA09 Expected date:  Expected time:  Means of arrival:  Comments: EMS- 50 yo HA
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
## 2015-07-19 NOTE — ED Provider Notes (Signed)
Physical Exam  BP 177/103 mmHg  Pulse 77  Temp(Src) 98.2 F (36.8 C) (Oral)  Resp 16  SpO2 99%  LMP 03/21/2014  Physical Exam  ED Course  Procedures  MDM BP 153/91 mmHg  Pulse 75  Temp(Src) 98.2 F (36.8 C) (Oral)  Resp 18  SpO2 94%  LMP 03/21/2014   3:40 PM Received report at beginning of shift.  Pt here with multiple complaints.  She has been evaluated by Donnald Garre, PA-C.  She is currently awaits abd MRI to evaluate a possible R adrenal mass.  If negative, she can f/u with [facility] for further evaluation and management of her health.     8:08 PM Patient's labs results are reassuring. MRI of the abdomen demonstrate a 4.8 cm heterogeneous right adrenal mass that is unchanged from 2015 and is compatible with a benign adrenal adenoma. Her head CT scan is unremarkable.  Results for orders placed or performed during the hospital encounter of 07/19/15  CBC with Differential  Result Value Ref Range   WBC 8.3 4.0 - 10.5 K/uL   RBC 4.41 3.87 - 5.11 MIL/uL   Hemoglobin 13.1 12.0 - 15.0 g/dL   HCT 39.8 36.0 - 46.0 %   MCV 90.2 78.0 - 100.0 fL   MCH 29.7 26.0 - 34.0 pg   MCHC 32.9 30.0 - 36.0 g/dL   RDW 14.1 11.5 - 15.5 %   Platelets 283 150 - 400 K/uL   Neutrophils Relative % 63 %   Neutro Abs 5.3 1.7 - 7.7 K/uL   Lymphocytes Relative 29 %   Lymphs Abs 2.4 0.7 - 4.0 K/uL   Monocytes Relative 5 %   Monocytes Absolute 0.4 0.1 - 1.0 K/uL   Eosinophils Relative 2 %   Eosinophils Absolute 0.1 0.0 - 0.7 K/uL   Basophils Relative 1 %   Basophils Absolute 0.1 0.0 - 0.1 K/uL  Comprehensive metabolic panel  Result Value Ref Range   Sodium 139 135 - 145 mmol/L   Potassium 3.9 3.5 - 5.1 mmol/L   Chloride 109 101 - 111 mmol/L   CO2 21 (L) 22 - 32 mmol/L   Glucose, Bld 95 65 - 99 mg/dL   BUN 15 6 - 20 mg/dL   Creatinine, Ser 1.01 (H) 0.44 - 1.00 mg/dL   Calcium 9.1 8.9 - 10.3 mg/dL   Total Protein 7.4 6.5 - 8.1 g/dL   Albumin 3.8 3.5 - 5.0 g/dL   AST  18 15 - 41 U/L   ALT 12 (L) 14 - 54 U/L   Alkaline Phosphatase 84 38 - 126 U/L   Total Bilirubin 0.9 0.3 - 1.2 mg/dL   GFR calc non Af Amer >60 >60 mL/min   GFR calc Af Amer >60 >60 mL/min   Anion gap 9 5 - 15  Lipase, blood  Result Value Ref Range   Lipase 39 11 - 51 U/L  Troponin I  Result Value Ref Range   Troponin I <0.03 <0.031 ng/mL   Ct Head Wo Contrast  07/19/2015  CLINICAL DATA:  50 year old female with visual disturbance and left eye discomfort. Left leg weakness for 2 days. History of right high surgery as a teenager. EXAM: CT HEAD WITHOUT CONTRAST TECHNIQUE: Contiguous axial images were obtained from the base of the skull through the vertex without intravenous contrast. COMPARISON:  02/23/2005 head CT FINDINGS: Very mild chronic small-vessel white matter ischemic changes are again noted. No acute intracranial abnormalities are identified, including mass lesion or  mass effect, hydrocephalus, extra-axial fluid collection, midline shift, hemorrhage, or acute infarction. The visualized bony calvarium is unremarkable. The visualized left globe and orbit are unremarkable. Right eye surgical changes noted. IMPRESSION: No evidence of acute intracranial abnormality. Very mild chronic small-vessel white matter ischemic changes. Electronically Signed   By: Margarette Canada M.D.   On: 07/19/2015 15:40   Mr Abdomen W Wo Contrast  07/19/2015  CLINICAL DATA:  Evaluate right adrenal mass from 2015. Abdominal pain, nausea/ vomiting. EXAM: MRI ABDOMEN WITHOUT AND WITH CONTRAST TECHNIQUE: Multiplanar multisequence MR imaging of the abdomen was performed both before and after the administration of intravenous contrast. CONTRAST:  75mL MULTIHANCE GADOBENATE DIMEGLUMINE 529 MG/ML IV SOLN COMPARISON:  CT abdomen pelvis dated 03/02/2014. FINDINGS: Lower chest:  Lung bases are clear. Hepatobiliary: Liver is within normal limits. No suspicious/enhancing hepatic lesions. Gallbladder is unremarkable. No intrahepatic or  extrahepatic ductal dilatation. Pancreas: Within normal limits. Spleen: Within normal limits. Adrenals/Urinary Tract: 4.8 x 3.6 cm heterogeneous right adrenal mass (series 3/image 20), previously 4.8 x 3.9 cm in 2015. Mass demonstrates mixed signal on T2 with dominant hyperintense component posteriorly (series 3/ image 21). Anteriorly, there is significant signal dropout on opposed phase imaging (series 5/ image 137), reflecting intracellular lipid. Heterogeneous enhancement is present. Given the stability, as well as the presence of intracellular lipid, this is compatible with a benign adrenal adenoma. Left adrenal gland is within normal limits. Kidneys are within normal limits.  No hydronephrosis. Stomach/Bowel: Stomach and visualized bowel are unremarkable. Vascular/Lymphatic: No evidence of abdominal aortic aneurysm. No suspicious abdominal lymphadenopathy. Other: No abdominal ascites. Musculoskeletal: No focal osseous lesions. IMPRESSION: 4.8 cm heterogeneous right adrenal mass with intracellular lipid, unchanged from 2015, compatible with a benign adrenal adenoma. Electronically Signed   By: Julian Hy M.D.   On: 07/19/2015 19:30      Jordan Moras, PA-C 07/19/15 2050  Leo Grosser, MD 07/20/15 1256

## 2015-11-25 DIAGNOSIS — Z1501 Genetic susceptibility to malignant neoplasm of breast: Secondary | ICD-10-CM

## 2015-11-25 DIAGNOSIS — I10 Essential (primary) hypertension: Secondary | ICD-10-CM

## 2015-11-25 DIAGNOSIS — E0822 Diabetes mellitus due to underlying condition with diabetic chronic kidney disease: Secondary | ICD-10-CM

## 2015-11-25 DIAGNOSIS — E039 Hypothyroidism, unspecified: Secondary | ICD-10-CM

## 2015-11-25 LAB — GLUCOSE, POCT (MANUAL RESULT ENTRY): POC GLUCOSE: 143 mg/dL — AB (ref 70–99)

## 2015-12-02 NOTE — Congregational Nurse Program (Signed)
Congregational Nurse Program Note  Date of Encounter: 11/25/2015  Past Medical History: Past Medical History  Diagnosis Date  . Thyroid disease   . Hypertension   . Cancer of thyroid ([place])   . Vitreous floaters of left eye     Encounter Details:     CNP Questionnaire - 12/02/15 1839    Patient Demographics   Is this a new or existing patient? New   Patient is considered a/an Not Applicable   Race African-American/Black   Patient Assistance   Location of Patient Assistance Not Applicable   Patient's financial/insurance status Medicaid   Uninsured Patient No   Patient referred to apply for the following financial assistance Medicaid   Food insecurities addressed Not Applicable   Transportation assistance No   Assistance securing medications Yes   Type of Assistance Other   Educational health offerings Chronic disease;Navigating the healthcare system;Medications;Hypertension;Diabetes;Nutrition   Encounter Details   Primary purpose of visit Chronic Illness/Condition Visit;Education/Health Concerns;Spiritual Care/Support Visit   Was an Emergency Department visit averted? Not Applicable   Does patient have a medical provider? Yes   Patient referred to Follow up with established PCP   Was a mental health screening completed? (GAINS tool) No   Does patient have dental issues? No   Does patient have vision issues? No   Does your patient have an abnormal blood pressure today? Yes   Since previous encounter, have you referred patient for abnormal blood pressure that resulted in a new diagnosis or medication change? No   Does your patient have an abnormal blood glucose today? Yes   Since previous encounter, have you referred patient for abnormal blood glucose that resulted in a new diagnosis or medication change? No   Was there a life-saving intervention made? No     Initial  Visit  For client states she needs help with her medications ,may not be taking them correctly. Nurse  reviewed her medications box and filled pill box ,reviewing all medications with  Client and letting her know  Why she was placed on each . Labeled and separated medication bottles so  Client could clearly know what she was taking. Client is to return next Wednesday to refill box and follow up with  Nurse. Newly  Diagnosed  Diabetic , blood sugar 143 ,counseled regarding diabetes and importance of taking medications once she gets her medications . Returning to  MD 12-02-15 to discuss with  MD  Her medication for diabetes. Stressed importance. 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 . Client was counseled on how  To  Do  Chair  Exercises as she states she cant do  Much walking because her leg aches. Her sister  Had  Diabetes , mother breast cancer and her grandmother died from 43 initial focus  Was on getting her to  Take her medications  Right, keep her upcoming appointment for treatment of her diabetes. breast cancer. She is on a lot of medication and has a lengthy medical history. I would like to see her back next week monitor her blood  Sugar and see if she has started medication for it.   Refill  Pill box given to  Her by  Nurse.

## 2015-12-03 DIAGNOSIS — E0822 Diabetes mellitus due to underlying condition with diabetic chronic kidney disease: Secondary | ICD-10-CM

## 2015-12-03 DIAGNOSIS — E039 Hypothyroidism, unspecified: Secondary | ICD-10-CM

## 2015-12-03 DIAGNOSIS — I10 Essential (primary) hypertension: Secondary | ICD-10-CM

## 2015-12-04 ENCOUNTER — Telehealth: Payer: Self-pay

## 2015-12-04 NOTE — Congregational Nurse Program (Signed)
Congregational Nurse Program Note  Date of Encounter: 12/03/2015  Past Medical History: Past Medical History  Diagnosis Date  . Thyroid disease   . Hypertension   . Cancer of thyroid ([place])   . Vitreous floaters of left eye     Encounter Details:     CNP Questionnaire - 12/04/15 0050    Patient Demographics   Is this a new or existing patient? Existing   Patient is considered a/an Not Applicable   Race African-American/Black   Patient Assistance   Location of Patient Assistance Not Applicable   Patient's financial/insurance status Medicaid;Medicare   Uninsured Patient No   Patient referred to apply for the following financial assistance Medicaid;Not Applicable   Food insecurities addressed Not Applicable   Transportation assistance No   Assistance securing medications Yes   Type of Assistance [facility]   Educational health offerings Chronic disease;Diabetes;Exercise/physical activity;Medications;Hypertension;Navigating the healthcare system;Safety   Encounter Details   Primary purpose of visit Chronic Illness/Condition Visit;Education/Health Concerns;Spiritual Care/Support Visit   Was an Emergency Department visit averted? Not Applicable   Does patient have a medical provider? Yes   Patient referred to Follow up with established PCP   Was a mental health screening completed? (GAINS tool) No   Does patient have dental issues? No   Does patient have vision issues? Yes   Was a vision referral made? No   Does your patient have an abnormal blood pressure today? Yes   Since previous encounter, have you referred patient for abnormal blood pressure that resulted in a new diagnosis or medication change? No   Does your patient have an abnormal blood glucose today? Yes   Since previous encounter, have you referred patient for abnormal blood glucose that resulted in a new diagnosis or medication change? No   Was there a life-saving intervention made? No     Client in to see  nurse to refill  Pill box and have her blood  Sugar  and blood pressure checked . States she saw her MD on yesterday  Dr.  Selina Cooley and was placed on the  insulin pen. Nurse ask client to  Bring medications in . Client is taking  0.6 mg of  Victoza administering in her stomach area ,wasnt sure if she needs to change the needle each time ,nurse instructed her yes to change her needles.and wrote that on the box for her. A review of her medications she is out of her Lasix. Called [facility] to refill medications and check on Liptor and  Gabapentin separately Gabapentin 600 mg were in a package  together along with Gabapentin.  Nurse  Separated  For client so she can best understand what she is taking.  Pharmacy confirmed. Client states she has Lipitor on hold her MD requested she bring all medication in for next visit.  Client has eye appointment 12-12-15   False eye is on the right.States  She didn't have much of an appetite  Today , nurse encouraged  Her to  Drink lots of  Fluids . Supplementing  Meal with ensure. Has drunk water and diet  Pepsi , counseled regarding diet  Sodas.   Blood  Pressure unable to read by machine taken manually   126/98 counseled . States her blood  Pressure is always very high .  Blood  Sugar  Was 93  Much lower than last week , now on insulin . Ask if client was given a glucose meter to check her blood sugars ,she  states no , nurse will chwck weekly  And she will call her MD . Later came back in and has called for meter.Lorrene Reid  Client on how to take all medications again today  And refilled  Pill box.  To follow up next week.

## 2015-12-04 NOTE — Telephone Encounter (Signed)
Telephone to  [facility]  To  Reorder  Lasix  , ordered all medications,they will deliver. Questioned the packaging of clients  Gabapentin and  Liptor  Together . Stated client had requested that  But  Client states she was having difficulty taking her medications  And knowing what to take .  Medications  Re-ordered

## 2015-12-05 ENCOUNTER — Emergency Department (HOSPITAL_COMMUNITY): Payer: Medicare Other

## 2015-12-05 ENCOUNTER — Encounter (HOSPITAL_COMMUNITY): Payer: Self-pay | Admitting: Emergency Medicine

## 2015-12-05 ENCOUNTER — Emergency Department (HOSPITAL_COMMUNITY)
Admission: EM | Admit: 2015-12-05 | Discharge: 2015-12-05 | Disposition: A | Discharge status: Home / Self Care | Payer: Medicare Other | Attending: Emergency Medicine | Admitting: Emergency Medicine

## 2015-12-05 DIAGNOSIS — M79605 Pain in left leg: Secondary | ICD-10-CM | POA: Diagnosis not present

## 2015-12-05 DIAGNOSIS — I10 Essential (primary) hypertension: Secondary | ICD-10-CM | POA: Diagnosis not present

## 2015-12-05 DIAGNOSIS — E119 Type 2 diabetes mellitus without complications: Secondary | ICD-10-CM | POA: Insufficient documentation

## 2015-12-05 DIAGNOSIS — Z7984 Long term (current) use of oral hypoglycemic drugs: Secondary | ICD-10-CM | POA: Insufficient documentation

## 2015-12-05 DIAGNOSIS — F17210 Nicotine dependence, cigarettes, uncomplicated: Secondary | ICD-10-CM | POA: Insufficient documentation

## 2015-12-05 DIAGNOSIS — E876 Hypokalemia: Secondary | ICD-10-CM | POA: Insufficient documentation

## 2015-12-05 DIAGNOSIS — G8929 Other chronic pain: Secondary | ICD-10-CM | POA: Insufficient documentation

## 2015-12-05 DIAGNOSIS — R200 Anesthesia of skin: Secondary | ICD-10-CM | POA: Insufficient documentation

## 2015-12-05 DIAGNOSIS — Z85850 Personal history of malignant neoplasm of thyroid: Secondary | ICD-10-CM | POA: Insufficient documentation

## 2015-12-05 DIAGNOSIS — R0600 Dyspnea, unspecified: Secondary | ICD-10-CM | POA: Insufficient documentation

## 2015-12-05 DIAGNOSIS — R0602 Shortness of breath: Secondary | ICD-10-CM | POA: Diagnosis present

## 2015-12-05 LAB — CBC WITH DIFFERENTIAL/PLATELET
Basophils Absolute: 0 10*3/uL (ref 0.0–0.1)
Basophils Relative: 0 %
EOS ABS: 0.1 10*3/uL (ref 0.0–0.7)
Eosinophils Relative: 2 %
HCT: 39.1 % (ref 36.0–46.0)
HEMOGLOBIN: 12.5 g/dL (ref 12.0–15.0)
LYMPHS ABS: 1.9 10*3/uL (ref 0.7–4.0)
Lymphocytes Relative: 23 %
MCH: 29.6 pg (ref 26.0–34.0)
MCHC: 32 g/dL (ref 30.0–36.0)
MCV: 92.4 fL (ref 78.0–100.0)
MONO ABS: 0.5 10*3/uL (ref 0.1–1.0)
MONOS PCT: 6 %
NEUTROS PCT: 69 %
Neutro Abs: 5.6 10*3/uL (ref 1.7–7.7)
Platelets: 237 10*3/uL (ref 150–400)
RBC: 4.23 MIL/uL (ref 3.87–5.11)
RDW: 15.2 % (ref 11.5–15.5)
WBC: 8.1 10*3/uL (ref 4.0–10.5)

## 2015-12-05 LAB — D-DIMER, QUANTITATIVE: D-Dimer, Quant: 0.37 ug/mL-FEU (ref 0.00–0.50)

## 2015-12-05 LAB — BASIC METABOLIC PANEL
ANION GAP: 9 (ref 5–15)
BUN: 40 mg/dL — AB (ref 6–20)
CALCIUM: 9.8 mg/dL (ref 8.9–10.3)
CO2: 24 mmol/L (ref 22–32)
Chloride: 102 mmol/L (ref 101–111)
Creatinine, Ser: 0.95 mg/dL (ref 0.44–1.00)
GFR calc Af Amer: >60 mL/min (ref >60)
GFR calc non Af Amer: >60 mL/min (ref >60)
GLUCOSE: 117 mg/dL — AB (ref 65–99)
POTASSIUM: 3.2 mmol/L — AB (ref 3.5–5.1)
Sodium: 135 mmol/L (ref 135–145)

## 2015-12-05 LAB — I-STAT TROPONIN, ED
TROPONIN I, POC: 0 ng/mL (ref 0.00–0.08)
TROPONIN I, POC: 0.02 ng/mL (ref 0.00–0.08)

## 2015-12-05 MED ORDER — POTASSIUM CHLORIDE CRYS ER 20 MEQ PO TBCR
40.0000 meq | EXTENDED_RELEASE_TABLET | Freq: Once | ORAL | Status: AC
  Administered 2015-12-05: 40 meq via ORAL
  Filled 2015-12-05: qty 2

## 2015-12-05 NOTE — ED Provider Notes (Signed)
CSN: [id number]     Arrival date & time 12/05/15  [id number] History   First MD Initiated Contact with Patient 12/05/15 [phone]     Chief Complaint  Patient presents with  . Shortness of Breath     (Consider location/radiation/quality/duration/timing/severity/associated sxs/prior Treatment) HPI Patient had shortness of breath onset yesterday 6 PM last 30 minutes, resolved spontaneously. She also had nausea and sweatiness this morning which lasted for approximately 30 minutes and resolve spontaneously she presently complains of numbness around both lips for the past 2 days and numbness in her feet for the past 6 months and left leg pain for the past 6 months. She was treated with an injection in her left buttock by her primary care physician for left leg pain a few days ago which did not help. Pain is at the left calf and at the left lateral thigh worse with walking and improved with rest. No other associated symptoms. Patient learned she was diabetic only a few days ago. No chest pain. No abdominal pain. Nothing makes symptoms better or worse. Past Medical History  Diagnosis Date  . Thyroid disease   . Hypertension   . Cancer of thyroid ([place])   . Vitreous floaters of left eye   Thyroid cancer Past Surgical History  Procedure Laterality Date  . Eye surgery    . Thyroid surgery  2008    Not sure what they did  . Tubal ligation  1991   Family History  Problem Relation Age of Onset  . Breast cancer Mother    Social History  Substance Use Topics  . Smoking status: Light Tobacco Smoker -- 0.01 packs/day    Types: Cigarettes  . Smokeless tobacco: None  . Alcohol Use: No   OB History    No data available     Review of Systems  Constitutional: Positive for diaphoresis.  Respiratory: Positive for shortness of breath.   Gastrointestinal: Positive for nausea.  Musculoskeletal: Positive for myalgias.       Left leg pain  Allergic/Immunologic: Positive for immunocompromised state.        Diabetic  Neurological: Positive for numbness.  All other systems reviewed and are negative.     Allergies  Bee venom  Home Medications   Prior to Admission medications   Medication Sig Start Date End Date Taking? Authorizing Provider  acetaminophen-codeine (TYLENOL #3) 300-30 MG tablet Take 1-2 tablets by mouth every 6 (six) hours as needed for moderate pain. 07/19/15   Domenic Moras, PA-C  amitriptyline (ELAVIL) 25 MG tablet Take 25 mg by mouth at bedtime. Reported on 07/19/2015    Historical Provider, MD  amLODipine (NORVASC) 5 MG tablet Take 5 mg by mouth daily. Reported on 07/19/2015    Historical Provider, MD  cetirizine (ZYRTEC ALLERGY) 10 MG tablet Take 1 tablet (10 mg total) by mouth daily. Patient not taking: Reported on 12/18/2014 11/20/13   Montine Circle, PA-C  cloNIDine (CATAPRES - DOSED IN MG/24 HR) 0.3 mg/24hr patch Place 0.3 mg onto the skin once a week.  06/05/15   Historical Provider, MD  dicyclomine (BENTYL) 20 MG tablet Take 1 tablet (20 mg total) by mouth 2 (two) times daily. Patient not taking: Reported on 12/18/2014 03/02/14   Tanna Furry, MD  hydrochlorothiazide (HYDRODIURIL) 25 MG tablet Take 1 tablet (25 mg total) by mouth daily. Patient not taking: Reported on 12/18/2014 01/24/13   Antonietta Breach, PA-C  levothyroxine (SYNTHROID, LEVOTHROID) 125 MCG tablet Take 125 mcg by mouth daily before breakfast.  07/08/15   Historical Provider, MD  levothyroxine (SYNTHROID, LEVOTHROID) 25 MCG tablet Take 25 mcg by mouth daily before breakfast.    Historical Provider, MD  lisinopril (PRINIVIL,ZESTRIL) 10 MG tablet Take 1 tablet (10 mg total) by mouth daily. Patient not taking: Reported on 07/19/2015 01/24/13   Antonietta Breach, PA-C  metFORMIN (GLUCOPHAGE) 500 MG tablet Take 500 mg by mouth daily with breakfast. Reported on 07/19/2015    Historical Provider, MD  ondansetron (ZOFRAN ODT) 4 MG disintegrating tablet Take 1 tablet (4 mg total) by mouth every 8 (eight) hours as needed for  nausea. Patient not taking: Reported on 12/18/2014 03/02/14   Tanna Furry, MD   BP 127/76 mmHg  Pulse 93  Temp(Src) 97.8 F (36.6 C) (Oral)  Resp 16  SpO2 98%  LMP 03/21/2014 Physical Exam  Constitutional: She appears well-developed and well-nourished.  HENT:  Head: Normocephalic and atraumatic.  Eyes: Conjunctivae are normal. Pupils are equal, round, and reactive to light.  Neck: Neck supple. No tracheal deviation present. No thyromegaly present.  Cardiovascular: Normal rate and regular rhythm.   No murmur heard. Pulmonary/Chest: Effort normal and breath sounds normal.  Abdominal: Soft. Bowel sounds are normal. She exhibits no distension. There is no tenderness.  Musculoskeletal: Normal range of motion. She exhibits no edema or tenderness.  All 4 extremities without redness swelling or tenderness neurovascularly intact. DP pulses 2+ bilaterally  Neurological: She is alert. Coordination normal.  Skin: Skin is warm and dry. No rash noted.  No rash. Bilateral lower extremity with normal hair. No chronic skin changes.  Psychiatric: She has a normal mood and affect.  Nursing note and vitals reviewed.   ED Course  Procedures (including critical care time) Labs Review Labs Reviewed - No data to display  Imaging Review No results found. I have personally reviewed and evaluated these images and lab results as part of my medical decision-making.   EKG Interpretation   Date/Time:  Friday December 05 2015 08:34:03 EDT Ventricular Rate:  87 PR Interval:    QRS Duration: 107 QT Interval:  404 QTC Calculation: 486 R Axis:   -46 Text Interpretation:  Sinus rhythm Incomplete left bundle branch block ST  elev, probable normal early repol pattern Borderline prolonged QT interval  No significant change since last tracing Confirmed by Winfred Leeds  MD, Azrielle Springsteen  [phone]) on 12/05/2015 8:38:17 AM     Chest x-ray viewed by me 1 PM patient resting comfortably. Denies dyspnea. Pulse oximetry on room  air 96%. Results for orders placed or performed during the hospital encounter of 12/05/15  CBC with Differential/Platelet  Result Value Ref Range   WBC 8.1 4.0 - 10.5 K/uL   RBC 4.23 3.87 - 5.11 MIL/uL   Hemoglobin 12.5 12.0 - 15.0 g/dL   HCT 39.1 36.0 - 46.0 %   MCV 92.4 78.0 - 100.0 fL   MCH 29.6 26.0 - 34.0 pg   MCHC 32.0 30.0 - 36.0 g/dL   RDW 15.2 11.5 - 15.5 %   Platelets 237 150 - 400 K/uL   Neutrophils Relative % 69 %   Neutro Abs 5.6 1.7 - 7.7 K/uL   Lymphocytes Relative 23 %   Lymphs Abs 1.9 0.7 - 4.0 K/uL   Monocytes Relative 6 %   Monocytes Absolute 0.5 0.1 - 1.0 K/uL   Eosinophils Relative 2 %   Eosinophils Absolute 0.1 0.0 - 0.7 K/uL   Basophils Relative 0 %   Basophils Absolute 0.0 0.0 - 0.1 K/uL  Basic metabolic  panel  Result Value Ref Range   Sodium 135 135 - 145 mmol/L   Potassium 3.2 (L) 3.5 - 5.1 mmol/L   Chloride 102 101 - 111 mmol/L   CO2 24 22 - 32 mmol/L   Glucose, Bld 117 (H) 65 - 99 mg/dL   BUN 40 (H) 6 - 20 mg/dL   Creatinine, Ser 0.95 0.44 - 1.00 mg/dL   Calcium 9.8 8.9 - 10.3 mg/dL   GFR calc non Af Amer >60 >60 mL/min   GFR calc Af Amer >60 >60 mL/min   Anion gap 9 5 - 15  D-dimer, quantitative (not at [facility])  Result Value Ref Range   D-Dimer, Quant 0.37 0.00 - 0.50 ug/mL-FEU  I-stat troponin, ED  Result Value Ref Range   Troponin i, poc 0.00 0.00 - 0.08 ng/mL   Comment 3          I-stat troponin, ED  Result Value Ref Range   Troponin i, poc 0.02 0.00 - 0.08 ng/mL   Comment 3           Dg Chest 2 View  12/05/2015  CLINICAL DATA:  Shortness of breath for several days.  Dizziness. EXAM: CHEST  2 VIEW COMPARISON:  September 14, 2010 FINDINGS: There is slight scarring in the left base which is stable. This no edema or consolidation. Heart size and pulmonary vascularity are normal. No adenopathy. There is mild degenerative change in the mid thoracic spine. IMPRESSION: Slight scarring left base. No edema or consolidation. Stable cardiac silhouette.  Electronically Signed   By: Lowella Grip III M.D.   On: 12/05/2015 09:15    Chest x-ray viewed by me MDM  No signs of vascular insufficiency in legs. Pretest clinical probability for thromboembolic disease is low. Negative d-dimer. I counseled patient for 5 minutes on smoking cessation. Tylenol for pain Final diagnoses:  None   Heart score equals 3. Patient's had no chest pain, nonacute EKG, 2 negative troponins. Plan follow-up with PMD numbness in feet for several months and pain in left leg likely secondary to neuropathy Diagnoses #1 dyspnea #2 hypokalemia #3 tobacco abuse #4 chronic numbness # 5 chronic pain in left leg    Orlie Dakin, MD 12/05/15 1315

## 2015-12-05 NOTE — ED Notes (Signed)
Returns from rad.

## 2015-12-05 NOTE — ED Notes (Signed)
Pt arrives via [facility] for c/o sob and left leg pain. Pt reports sob began this am while she was sitting, pt reports associated diaphoresis at the time. Pt denies chest pain but reports nausea that has now subsided. Pt a/ox 4, resp e/u, skin warm and dry. NAD

## 2015-12-05 NOTE — ED Notes (Signed)
Pt noted as 87% Sa o2. Placed on 2 l/[place] and sat 95%.

## 2015-12-05 NOTE — Discharge Instructions (Signed)
Contact your primary care physician's office today or Monday, 12/08/2015 to arrange to be seen in the office next week. Your blood potassium level was mildly low at 3.2 and should be rechecked. You also may need further evaluation as an outpatient to check your heart. Return if you feel worse for any reason. Ask your doctor to help you to stop smoking .

## 2015-12-05 NOTE — ED Notes (Signed)
MD at bedside. 

## 2015-12-10 DIAGNOSIS — E0822 Diabetes mellitus due to underlying condition with diabetic chronic kidney disease: Secondary | ICD-10-CM

## 2015-12-10 LAB — GLUCOSE, POCT (MANUAL RESULT ENTRY)

## 2015-12-10 NOTE — Congregational Nurse Program (Signed)
Congregational Nurse Program Note  Date of Encounter: 12/10/2015  Past Medical History: Past Medical History  Diagnosis Date  . Thyroid disease   . Hypertension   . Cancer of thyroid ([place])   . Vitreous floaters of left eye     Encounter Details:     CNP Questionnaire - 12/10/15 1815    Patient Demographics   Is this a new or existing patient? Existing   Patient is considered a/an Not Applicable   Race African-American/Black   Patient Assistance   Location of Patient Assistance Not Applicable   Patient's financial/insurance status Medicaid;Medicare   Uninsured Patient No   Patient referred to apply for the following financial assistance Not Applicable   Food insecurities addressed Not Applicable   Transportation assistance No   Assistance securing medications No   Educational health offerings Medications;Diabetes;Chronic disease   Encounter Details   Primary purpose of visit Education/Health Concerns;Spiritual Care/Support Visit   Was an Emergency Department visit averted? Not Applicable   Does patient have a medical provider? Yes   Patient referred to Follow up with established PCP   Was a mental health screening completed? (GAINS tool) No   Does patient have dental issues? No   Does patient have vision issues? Yes   Was a vision referral made? No   Does your patient have an abnormal blood pressure today? No   Since previous encounter, have you referred patient for abnormal blood pressure that resulted in a new diagnosis or medication change? No   Does your patient have an abnormal blood glucose today? No   Since previous encounter, have you referred patient for abnormal blood glucose that resulted in a new diagnosis or medication change? No   Was there a life-saving intervention made? No    Brief  Visit today as client was on her way to  [place]. Nurse checked her blood  Sugar  And checked her medication box. Pharmacy has  Prepackaged  Medications  For  Am  And pm dosage  so  Nurse did not  Fill  Pill box.  Blood  Sugar was 129 mg counseled regarding that  Reading., to  Take  Medications. Client is  To  Return  To  MD  On  Friday  And  Take  All  Her medications for  MD  To  Review  As she  Has  Stopped  Taking  Her Liptor  Due to  Leg  Pains.

## 2015-12-11 ENCOUNTER — Emergency Department (HOSPITAL_COMMUNITY)
Admission: EM | Admit: 2015-12-11 | Discharge: 2015-12-11 | Disposition: A | Discharge status: Home / Self Care | Payer: Medicare Other | Attending: Emergency Medicine | Admitting: Emergency Medicine

## 2015-12-11 ENCOUNTER — Encounter (HOSPITAL_COMMUNITY): Payer: Self-pay | Admitting: Emergency Medicine

## 2015-12-11 DIAGNOSIS — F411 Generalized anxiety disorder: Secondary | ICD-10-CM | POA: Insufficient documentation

## 2015-12-11 DIAGNOSIS — F1290 Cannabis use, unspecified, uncomplicated: Secondary | ICD-10-CM | POA: Diagnosis not present

## 2015-12-11 DIAGNOSIS — Z79899 Other long term (current) drug therapy: Secondary | ICD-10-CM | POA: Insufficient documentation

## 2015-12-11 DIAGNOSIS — F17210 Nicotine dependence, cigarettes, uncomplicated: Secondary | ICD-10-CM | POA: Insufficient documentation

## 2015-12-11 DIAGNOSIS — Z85850 Personal history of malignant neoplasm of thyroid: Secondary | ICD-10-CM | POA: Insufficient documentation

## 2015-12-11 DIAGNOSIS — I10 Essential (primary) hypertension: Secondary | ICD-10-CM | POA: Diagnosis not present

## 2015-12-11 DIAGNOSIS — R4589 Other symptoms and signs involving emotional state: Secondary | ICD-10-CM

## 2015-12-11 DIAGNOSIS — F419 Anxiety disorder, unspecified: Secondary | ICD-10-CM | POA: Diagnosis present

## 2015-12-11 LAB — POTASSIUM: POTASSIUM: 3 mmol/L — AB (ref 3.5–5.1)

## 2015-12-11 MED ORDER — SODIUM CHLORIDE 0.9 % IV BOLUS (SEPSIS)
500.0000 mL | Freq: Once | INTRAVENOUS | Status: AC
  Administered 2015-12-11: 500 mL via INTRAVENOUS

## 2015-12-11 NOTE — ED Notes (Signed)
Bed: WA03 Expected date:  Expected time:  Means of arrival:  Comments: EMS 50 yo female panic attack

## 2015-12-11 NOTE — Discharge Instructions (Signed)
There is not appear to be an emergent cause her symptoms at this time. Your exam, labs were very reassuring. It is important to drink lots of fluids as we discussed. At least 8, 8 ounce glasses of water per day. Follow-up with your doctor in the next 2-3 days for reevaluation. Return to ED for new or worsening symptoms.  Panic Attacks Panic attacks are sudden, short feelings of great fear or discomfort. You may have them for no reason when you are relaxed, when you are uneasy (anxious), or when you are sleeping.  HOME CARE  Take all your medicines as told.  Check with your doctor before starting new medicines.  Keep all doctor visits. GET HELP IF:  You are not able to take your medicines as told.  Your symptoms do not get better.  Your symptoms get worse. GET HELP RIGHT AWAY IF:  Your attacks seem different than your normal attacks.  You have thoughts about hurting yourself or others.  You take panic attack medicine and you have a side effect. MAKE SURE YOU:  Understand these instructions.  Will watch your condition.  Will get help right away if you are not doing well or get worse.   This information is not intended to replace advice given to you by your health care provider. Make sure you discuss any questions you have with your health care provider.   Document Released: 06/26/2010 Document Revised: 03/14/2013 Document Reviewed: 01/05/2013 Elsevier Interactive Patient Education [employer].

## 2015-12-11 NOTE — ED Provider Notes (Signed)
CSN: [id number]     Arrival date & time 12/11/15  0006 History   By signing my name below, I, Randa Evens, attest that this documentation has been prepared under the direction and in the presence of [employer], PA-C. Electronically Signed: Randa Evens, ED Scribe. 12/11/2015. 1:43 AM.    Chief Complaint  Patient presents with  . Anxiety    The history is provided by the patient. No language interpreter was used.   HPI Comments: Evenie Balta is a 50 y.o. female who presents to the Emergency Department complaining of resolved sharp stabbing CP onset tonight 2 hours  PTA. Pt reports associated resolved SOB and tingling in hands and toes. Pt states that she was laying down during the onset of her pain and it just received news to one of her friends had passed away. Pt doesn't report any medications PTA. Denies discomfort now in the emergency department. Pt denies any other symptoms at this time. No other modifying factors.    Past Medical History  Diagnosis Date  . Thyroid disease   . Hypertension   . Cancer of thyroid ([place])   . Vitreous floaters of left eye    Past Surgical History  Procedure Laterality Date  . Eye surgery    . Thyroid surgery  2008    Not sure what they did  . Tubal ligation  1991   Family History  Problem Relation Age of Onset  . Breast cancer Mother    Social History  Substance Use Topics  . Smoking status: Light Tobacco Smoker -- 0.01 packs/day    Types: Cigarettes  . Smokeless tobacco: None  . Alcohol Use: No   OB History    No data available     Review of Systems A complete 10 system review of systems was obtained and all systems are negative except as noted in the HPI and PMH.     Allergies  Bee venom  Home Medications   Prior to Admission medications   Medication Sig Start Date End Date Taking? Authorizing Provider  amitriptyline (ELAVIL) 25 MG tablet Take 25 mg by mouth at bedtime. Reported on 07/19/2015   Yes Historical  Provider, MD  amLODipine (NORVASC) 5 MG tablet Take 5 mg by mouth daily. Reported on 07/19/2015   Yes Historical Provider, MD  furosemide (LASIX) 20 MG tablet Take 20 mg by mouth daily. 10/31/15  Yes Historical Provider, MD  gabapentin (NEURONTIN) 600 MG tablet Take 600 mg by mouth 2 (two) times daily. 10/31/15  Yes Historical Provider, MD  hydrochlorothiazide (HYDRODIURIL) 25 MG tablet Take 1 tablet (25 mg total) by mouth daily. 01/24/13  Yes Antonietta Breach, PA-C  levothyroxine (SYNTHROID, LEVOTHROID) 125 MCG tablet Take 125 mcg by mouth daily before breakfast.  07/08/15  Yes Historical Provider, MD  Liraglutide (VICTOZA) 18 MG/3ML SOPN Inject 0.6 mg into the skin daily.   Yes Historical Provider, MD  lisinopril (PRINIVIL,ZESTRIL) 10 MG tablet Take 1 tablet (10 mg total) by mouth daily. 01/24/13  Yes Antonietta Breach, PA-C  oxyCODONE-acetaminophen (PERCOCET) 7.5-325 MG tablet Take 1 tablet by mouth 3 (three) times daily as needed. 11/27/15  Yes Historical Provider, MD  atorvastatin (LIPITOR) 20 MG tablet Take 20 mg by mouth daily. Reported on 12/11/2015 10/31/15   Historical Provider, MD  EDARBYCLOR 40-25 MG TABS Take 1 tablet by mouth daily. Reported on 12/11/2015 11/18/15   Historical Provider, MD  metFORMIN (GLUCOPHAGE) 500 MG tablet Take 500 mg by mouth daily with breakfast. Reported on  12/11/2015    Historical Provider, MD   BP 106/59 mmHg  Pulse 88  Temp(Src) 97.5 F (36.4 C) (Oral)  Resp 15  SpO2 97%  LMP 03/21/2014   Physical Exam  Constitutional: She is oriented to person, place, and time. She appears well-developed and well-nourished. No distress.  HENT:  Head: Normocephalic and atraumatic.  mildly dry mucous membranes.  Eyes: Conjunctivae and EOM are normal.  Neck: Neck supple. No tracheal deviation present.  Cardiovascular: Regular rhythm and normal heart sounds.  Tachycardia present.   Normal rate   Pulmonary/Chest: Effort normal. No respiratory distress.  Abdominal:  Mild tenderness diffusely  about abdomen.  Musculoskeletal: Normal range of motion.  Neurological: She is alert and oriented to person, place, and time.  Skin: Skin is warm and dry.  Psychiatric: She has a normal mood and affect. Her behavior is normal.  Nursing note and vitals reviewed.   ED Course  Procedures (including critical care time) DIAGNOSTIC STUDIES: Oxygen Saturation is 97% on RA, normal by my interpretation.    COORDINATION OF CARE: 1:15 AM-Discussed treatment plan with pt at bedside and pt agreed to plan.     Labs Review Labs Reviewed  POTASSIUM - Abnormal; Notable for the following:    Potassium 3.0 (*)    All other components within normal limits  I-STAT CHEM 8, ED - Abnormal; Notable for the following:    Potassium 7.8 (*)    BUN 34 (*)    Creatinine, Ser 1.20 (*)    Glucose, Bld 109 (*)    Calcium, Ion 0.94 (*)    All other components within normal limits    Imaging Review No results found. I have personally reviewed and evaluated these images and lab results as part of my medical decision-making.   EKG Interpretation   Date/Time:  Thursday December 11 2015 00:14:59 EDT Ventricular Rate:  89 PR Interval:    QRS Duration: 107 QT Interval:  356 QTC Calculation: 434 R Axis:   -31 Text Interpretation:  Sinus rhythm Left axis deviation ST elev, probable  normal early repol pattern Baseline wander in lead(s) III When compared  with ECG of 12/05/2015, No significant change was found No findings to  suggest hyperkalemia Confirmed by [facility]  MD, DAVID ([id number]) on 12/11/2015  3:23:59 AM      MDM  Patient with fleeting chest discomfort, shortness of breath and tingling in hands and toes after receiving bad news. Symptoms do not sound consistent with ACS, PE or other emergent cardiac pulmonary pathology. EKG reassuring. She is asymptomatic now. Screening labs are reassuring. She did seem mildly dehydrated, given fluids in emergency department. Encouraged continued fluid rehydration at  home. Final diagnoses:  Feeling anxious    I personally performed the services described in this documentation, which was scribed in my presence. The recorded information has been reviewed and is accurate.      Comer Locket, PA-C [id number]  [place]ora Fuel, MD [id number]

## 2015-12-11 NOTE — ED Notes (Signed)
Patient here from shelter, [facility], with complaints of anxiety attack. Report losing friend awhile ago. Initially complained of central chest pain, numbness of toes and fingers, hyperventilating. Currently calm and relaxed.

## 2015-12-15 LAB — I-STAT CHEM 8, ED
BUN: 34 mg/dL — AB (ref 6–20)
Calcium, Ion: 0.94 mmol/L — ABNORMAL LOW (ref 1.13–1.30)
Chloride: 105 mmol/L (ref 101–111)
Creatinine, Ser: 1.2 mg/dL — ABNORMAL HIGH (ref 0.44–1.00)
Glucose, Bld: 109 mg/dL — ABNORMAL HIGH (ref 65–99)
HEMATOCRIT: 36 % (ref 36.0–46.0)
Hemoglobin: 12.2 g/dL (ref 12.0–15.0)
Potassium: 7.8 mmol/L (ref 3.5–5.1)
SODIUM: 135 mmol/L (ref 135–145)
TCO2: 28 mmol/L (ref 0–100)

## 2015-12-24 DIAGNOSIS — I10 Essential (primary) hypertension: Secondary | ICD-10-CM

## 2015-12-24 DIAGNOSIS — E0822 Diabetes mellitus due to underlying condition with diabetic chronic kidney disease: Secondary | ICD-10-CM

## 2015-12-24 LAB — GLUCOSE, POCT (MANUAL RESULT ENTRY)

## 2015-12-24 NOTE — Congregational Nurse Program (Signed)
Congregational Nurse Program Note  Date of Encounter: 12/24/2015  Past Medical History: Past Medical History  Diagnosis Date  . Thyroid disease   . Hypertension   . Cancer of thyroid ([place])   . Vitreous floaters of left eye     Encounter Details:     CNP Questionnaire - 12/24/15 1833    Patient Demographics   Is this a new or existing patient? Existing   Patient is considered a/an Not Applicable   Race African-American/Black   Patient Assistance   Location of Patient Assistance Not Applicable   Patient's financial/insurance status Medicare;Medicaid   Uninsured Patient No   Patient referred to apply for the following financial assistance Not Applicable   Food insecurities addressed Not Applicable   Transportation assistance No   Assistance securing medications No   Educational health offerings Diabetes   Encounter Details   Primary purpose of visit Education/Health Concerns;Spiritual Care/Support Visit;Chronic Illness/Condition Visit   Was an Emergency Department visit averted? Not Applicable   Does patient have a medical provider? Yes   Patient referred to Follow up with established PCP   Was a mental health screening completed? (GAINS tool) No   Does patient have dental issues? No   Does patient have vision issues? Yes   Was a vision referral made? No   Does your patient have an abnormal blood pressure today? Yes   Since previous encounter, have you referred patient for abnormal blood pressure that resulted in a new diagnosis or medication change? No   Does your patient have an abnormal blood glucose today? Yes   Since previous encounter, have you referred patient for abnormal blood glucose that resulted in a new diagnosis or medication change? No   Was there a life-saving intervention made? No    Client in today  Seen by  Her  PCP  Last Friday  And was taken off the cholestero medication .  Blood  Pressure  Today was 130/98  , pulse 80  Blood  Sugar 108 @ 3;52  Pm. Was  off her insulin for 2-3  Days  Pharmacy  Didn't have it  Ready , then didn't get her pens ,now  Has medication and took it  Around  1 pm today.  Counseled  Regarding her medications . Nutrition counseling  Done , cut  Back on carbohydrates. . Counseled  regarding fruits  And  what  a serving size is  Will  Monitor client as she is a new  Diabetic . Taking  Insulin with side effects  So  Far.

## 2015-12-31 DIAGNOSIS — E039 Hypothyroidism, unspecified: Secondary | ICD-10-CM

## 2015-12-31 DIAGNOSIS — I10 Essential (primary) hypertension: Secondary | ICD-10-CM

## 2015-12-31 DIAGNOSIS — E0822 Diabetes mellitus due to underlying condition with diabetic chronic kidney disease: Secondary | ICD-10-CM

## 2015-12-31 LAB — GLUCOSE, POCT (MANUAL RESULT ENTRY)

## 2015-12-31 NOTE — Congregational Nurse Program (Signed)
Congregational Nurse Program Note  Date of Encounter: 12/31/2015  Past Medical History: Past Medical History:  Diagnosis Date  . Cancer of thyroid ([place])   . Hypertension   . Thyroid disease   . Vitreous floaters of left eye     Encounter Details:     CNP Questionnaire - 12/31/15 1831      Patient Demographics   Is this a new or existing patient? Existing   Patient is considered a/an Not Applicable   Race African-American/Black     Patient Assistance   Location of Patient Assistance Not Applicable   Patient's financial/insurance status Medicaid;Medicare   Uninsured Patient No   Patient referred to apply for the following financial assistance Not Applicable   Food insecurities addressed Not Applicable   Transportation assistance No   Assistance securing medications No   Educational health offerings Chronic disease;Diabetes;Nutrition;Medications;Hypertension     Encounter Details   Primary purpose of visit Chronic Illness/Condition Visit;Education/Health Concerns;Spiritual Care/Support Visit   Was an Emergency Department visit averted? Not Applicable   Does patient have a medical provider? Yes   Patient referred to Follow up with established PCP   Was a mental health screening completed? (GAINS tool) No   Does patient have dental issues? No   Does patient have vision issues? Yes   Was a vision referral made? No   Does your patient have an abnormal blood pressure today? Yes   Since previous encounter, have you referred patient for abnormal blood pressure that resulted in a new diagnosis or medication change? No   Does your patient have an abnormal blood glucose today? Yes   Since previous encounter, have you referred patient for abnormal blood glucose that resulted in a new diagnosis or medication change? No   Was there a life-saving intervention made? No      Client in to see nurse states she wants nurse to check her medications to  Make sure she is taking  Them  correctly.  Nurse reviwed  Drug  Box  And  Medications  Were totally  Confusing as cleint had  placed some in her weekly  Med box, medications  Were already prepackaged for am and pm dosages  But  Client had  Forgotten and she  Had  Extras of  Several medications in bottles. Nurse rearranged a and labled  All  Medications,reviewed again with  Client. Client has  Had custody  Her her grandson since  He was  7 weeks  Old  He  States. She has full custody.. Counseled  Regarding takingof insulin , her blood  Sugar  Results  And  Blood  Pressure. Monitor weekly  Medications  And  Diabetes.

## 2016-01-06 DIAGNOSIS — E039 Hypothyroidism, unspecified: Secondary | ICD-10-CM

## 2016-01-06 DIAGNOSIS — E0822 Diabetes mellitus due to underlying condition with diabetic chronic kidney disease: Secondary | ICD-10-CM

## 2016-01-06 DIAGNOSIS — I10 Essential (primary) hypertension: Secondary | ICD-10-CM

## 2016-01-06 LAB — GLUCOSE, POCT (MANUAL RESULT ENTRY): POC Glucose: 111 mg/dl — AB (ref 70–99)

## 2016-01-06 NOTE — Congregational Nurse Program (Signed)
Congregational Nurse Program Note  Date of Encounter: 01/06/2016  Past Medical History: Past Medical History:  Diagnosis Date  . Cancer of thyroid ([place])   . Hypertension   . Thyroid disease   . Vitreous floaters of left eye     Encounter Details:     CNP Questionnaire - 01/06/16 2256      Patient Demographics   Is this a new or existing patient? Existing   Patient is considered a/an Not Applicable   Race African-American/Black     Patient Assistance   Location of Patient Assistance Not Applicable   Patient's financial/insurance status Medicaid;Medicare   Uninsured Patient No   Patient referred to apply for the following financial assistance Not Applicable   Food insecurities addressed Not Applicable   Transportation assistance No   Assistance securing medications No   Educational health offerings Chronic disease;Diabetes;Nutrition     Encounter Details   Primary purpose of visit Chronic Illness/Condition Visit;Education/Health Concerns;Spiritual Care/Support Visit   Was an Emergency Department visit averted? Not Applicable   Does patient have a medical provider? Yes   Patient referred to Follow up with established PCP   Was a mental health screening completed? (GAINS tool) No   Does patient have dental issues? No   Does patient have vision issues? Yes   Was a vision referral made? No   Does your patient have an abnormal blood pressure today? Yes   Since previous encounter, have you referred patient for abnormal blood pressure that resulted in a new diagnosis or medication change? No   Does your patient have an abnormal blood glucose today? Yes   Since previous encounter, have you referred patient for abnormal blood glucose that resulted in a new diagnosis or medication change? No   Was there a life-saving intervention made? No     Client in to  Have  Blood  Pressure and  Blood sugar taken . States she is  Taking her medications  But  Needs a battery  For her  glucometer. Nurse was unable to get a blood  Pressure reading on the automatic  Machine or hear clients  Blood  Pressure with manual cuff. This  has  ocurred several times.Client states he feels  Fine. Counsel regarding  Blood  Sugar reading . Follow  weekly

## 2016-01-08 ENCOUNTER — Encounter (HOSPITAL_COMMUNITY): Payer: Self-pay | Admitting: Family Medicine

## 2016-01-08 ENCOUNTER — Inpatient Hospital Stay (HOSPITAL_COMMUNITY)
Admission: EM | Admit: 2016-01-08 | Discharge: 2016-01-11 | DRG: 253 | Disposition: A | Discharge status: Home / Self Care | Payer: Medicare Other | Attending: Student in an Organized Health Care Education/Training Program | Admitting: Student in an Organized Health Care Education/Training Program

## 2016-01-08 ENCOUNTER — Emergency Department (HOSPITAL_COMMUNITY): Payer: Medicare Other

## 2016-01-08 DIAGNOSIS — Z01810 Encounter for preprocedural cardiovascular examination: Secondary | ICD-10-CM

## 2016-01-08 DIAGNOSIS — F17210 Nicotine dependence, cigarettes, uncomplicated: Secondary | ICD-10-CM | POA: Diagnosis present

## 2016-01-08 DIAGNOSIS — E119 Type 2 diabetes mellitus without complications: Secondary | ICD-10-CM | POA: Diagnosis not present

## 2016-01-08 DIAGNOSIS — Z23 Encounter for immunization: Secondary | ICD-10-CM | POA: Diagnosis not present

## 2016-01-08 DIAGNOSIS — E7800 Pure hypercholesterolemia, unspecified: Secondary | ICD-10-CM | POA: Diagnosis present

## 2016-01-08 DIAGNOSIS — D62 Acute posthemorrhagic anemia: Secondary | ICD-10-CM | POA: Diagnosis not present

## 2016-01-08 DIAGNOSIS — I208 Other forms of angina pectoris: Secondary | ICD-10-CM

## 2016-01-08 DIAGNOSIS — E039 Hypothyroidism, unspecified: Secondary | ICD-10-CM | POA: Diagnosis present

## 2016-01-08 DIAGNOSIS — E1151 Type 2 diabetes mellitus with diabetic peripheral angiopathy without gangrene: Secondary | ICD-10-CM | POA: Diagnosis present

## 2016-01-08 DIAGNOSIS — R109 Unspecified abdominal pain: Secondary | ICD-10-CM | POA: Diagnosis not present

## 2016-01-08 DIAGNOSIS — F1290 Cannabis use, unspecified, uncomplicated: Secondary | ICD-10-CM | POA: Diagnosis present

## 2016-01-08 DIAGNOSIS — I10 Essential (primary) hypertension: Secondary | ICD-10-CM | POA: Diagnosis present

## 2016-01-08 DIAGNOSIS — I2510 Atherosclerotic heart disease of native coronary artery without angina pectoris: Secondary | ICD-10-CM | POA: Diagnosis not present

## 2016-01-08 DIAGNOSIS — R079 Chest pain, unspecified: Secondary | ICD-10-CM | POA: Diagnosis not present

## 2016-01-08 DIAGNOSIS — E785 Hyperlipidemia, unspecified: Secondary | ICD-10-CM | POA: Diagnosis present

## 2016-01-08 DIAGNOSIS — E890 Postprocedural hypothyroidism: Secondary | ICD-10-CM

## 2016-01-08 DIAGNOSIS — I745 Embolism and thrombosis of iliac artery: Secondary | ICD-10-CM | POA: Diagnosis present

## 2016-01-08 DIAGNOSIS — Z803 Family history of malignant neoplasm of breast: Secondary | ICD-10-CM

## 2016-01-08 DIAGNOSIS — Z85850 Personal history of malignant neoplasm of thyroid: Secondary | ICD-10-CM | POA: Diagnosis not present

## 2016-01-08 DIAGNOSIS — I25119 Atherosclerotic heart disease of native coronary artery with unspecified angina pectoris: Secondary | ICD-10-CM | POA: Diagnosis present

## 2016-01-08 DIAGNOSIS — I959 Hypotension, unspecified: Secondary | ICD-10-CM | POA: Diagnosis present

## 2016-01-08 DIAGNOSIS — Z7984 Long term (current) use of oral hypoglycemic drugs: Secondary | ICD-10-CM | POA: Diagnosis not present

## 2016-01-08 DIAGNOSIS — Z8249 Family history of ischemic heart disease and other diseases of the circulatory system: Secondary | ICD-10-CM | POA: Diagnosis not present

## 2016-01-08 DIAGNOSIS — I998 Other disorder of circulatory system: Secondary | ICD-10-CM | POA: Diagnosis present

## 2016-01-08 DIAGNOSIS — G8929 Other chronic pain: Secondary | ICD-10-CM | POA: Diagnosis present

## 2016-01-08 DIAGNOSIS — R200 Anesthesia of skin: Secondary | ICD-10-CM

## 2016-01-08 DIAGNOSIS — R208 Other disturbances of skin sensation: Secondary | ICD-10-CM

## 2016-01-08 DIAGNOSIS — Z955 Presence of coronary angioplasty implant and graft: Secondary | ICD-10-CM | POA: Diagnosis not present

## 2016-01-08 DIAGNOSIS — I739 Peripheral vascular disease, unspecified: Secondary | ICD-10-CM

## 2016-01-08 DIAGNOSIS — F17200 Nicotine dependence, unspecified, uncomplicated: Secondary | ICD-10-CM | POA: Diagnosis not present

## 2016-01-08 HISTORY — DX: Type 2 diabetes mellitus without complications: E11.9

## 2016-01-08 LAB — BASIC METABOLIC PANEL
ANION GAP: 9 (ref 5–15)
BUN: 22 mg/dL — ABNORMAL HIGH (ref 6–20)
CALCIUM: 9.4 mg/dL (ref 8.9–10.3)
CO2: 26 mmol/L (ref 22–32)
CREATININE: 1.06 mg/dL — AB (ref 0.44–1.00)
Chloride: 102 mmol/L (ref 101–111)
Glucose, Bld: 106 mg/dL — ABNORMAL HIGH (ref 65–99)
Potassium: 3.7 mmol/L (ref 3.5–5.1)
Sodium: 137 mmol/L (ref 135–145)

## 2016-01-08 LAB — CBC WITH DIFFERENTIAL/PLATELET
BASOS ABS: 0 10*3/uL (ref 0.0–0.1)
BASOS PCT: 0 %
EOS ABS: 0.1 10*3/uL (ref 0.0–0.7)
Eosinophils Relative: 1 %
HCT: 37 % (ref 36.0–46.0)
Hemoglobin: 11.9 g/dL — ABNORMAL LOW (ref 12.0–15.0)
Lymphocytes Relative: 26 %
Lymphs Abs: 1.8 10*3/uL (ref 0.7–4.0)
MCH: 30.1 pg (ref 26.0–34.0)
MCHC: 32.2 g/dL (ref 30.0–36.0)
MCV: 93.4 fL (ref 78.0–100.0)
MONO ABS: 0.4 10*3/uL (ref 0.1–1.0)
MONOS PCT: 6 %
NEUTROS ABS: 4.8 10*3/uL (ref 1.7–7.7)
NEUTROS PCT: 67 %
Platelets: 323 10*3/uL (ref 150–400)
RBC: 3.96 MIL/uL (ref 3.87–5.11)
RDW: 14.7 % (ref 11.5–15.5)
WBC: 7.1 10*3/uL (ref 4.0–10.5)

## 2016-01-08 LAB — I-STAT BETA HCG BLOOD, ED (MC, WL, AP ONLY)

## 2016-01-08 LAB — PROTIME-INR
INR: 0.9
PROTHROMBIN TIME: 12.2 s (ref 11.4–15.2)

## 2016-01-08 LAB — RAPID URINE DRUG SCREEN, HOSP PERFORMED
AMPHETAMINES: NOT DETECTED
BENZODIAZEPINES: NOT DETECTED
Barbiturates: NOT DETECTED
Cocaine: POSITIVE — AB
OPIATES: NOT DETECTED
Tetrahydrocannabinol: POSITIVE — AB

## 2016-01-08 LAB — I-STAT CHEM 8, ED
BUN: 24 mg/dL — ABNORMAL HIGH (ref 6–20)
Calcium, Ion: 1.2 mmol/L (ref 1.13–1.30)
Chloride: 101 mmol/L (ref 101–111)
Creatinine, Ser: 1 mg/dL (ref 0.44–1.00)
Glucose, Bld: 102 mg/dL — ABNORMAL HIGH (ref 65–99)
HEMATOCRIT: 37 % (ref 36.0–46.0)
HEMOGLOBIN: 12.6 g/dL (ref 12.0–15.0)
POTASSIUM: 3.6 mmol/L (ref 3.5–5.1)
SODIUM: 139 mmol/L (ref 135–145)
TCO2: 26 mmol/L (ref 0–100)

## 2016-01-08 LAB — GLUCOSE, CAPILLARY: GLUCOSE-CAPILLARY: 172 mg/dL — AB (ref 65–99)

## 2016-01-08 LAB — HEPARIN LEVEL (UNFRACTIONATED): HEPARIN UNFRACTIONATED: 0.13 [IU]/mL — AB (ref 0.30–0.70)

## 2016-01-08 LAB — CBG MONITORING, ED: GLUCOSE-CAPILLARY: 118 mg/dL — AB (ref 65–99)

## 2016-01-08 MED ORDER — DIFLUPREDNATE 0.05 % OP EMUL: 2.0000 [drp] | Freq: Every day | OPHTHALMIC | Status: DC

## 2016-01-08 MED ORDER — INSULIN ASPART 100 UNIT/ML ~~LOC~~ SOLN
0.0000 [IU] | Freq: Three times a day (TID) | SUBCUTANEOUS | Status: DC
  Administered 2016-01-10: 1 [IU] via SUBCUTANEOUS
  Administered 2016-01-10: 2 [IU] via SUBCUTANEOUS
  Administered 2016-01-11: 1 [IU] via SUBCUTANEOUS

## 2016-01-08 MED ORDER — ASPIRIN 81 MG PO CHEW
81.0000 mg | CHEWABLE_TABLET | Freq: Once | ORAL | Status: AC
  Administered 2016-01-08: 81 mg via ORAL
  Filled 2016-01-08: qty 1

## 2016-01-08 MED ORDER — GABAPENTIN 600 MG PO TABS
600.0000 mg | ORAL_TABLET | Freq: Two times a day (BID) | ORAL | Status: DC
  Administered 2016-01-08 – 2016-01-11 (×5): 600 mg via ORAL
  Filled 2016-01-08 (×7): qty 1

## 2016-01-08 MED ORDER — LEVOTHYROXINE SODIUM 25 MCG PO TABS
125.0000 ug | ORAL_TABLET | Freq: Every day | ORAL | Status: DC
  Administered 2016-01-09 – 2016-01-11 (×3): 125 ug via ORAL
  Filled 2016-01-08 (×4): qty 1

## 2016-01-08 MED ORDER — HEPARIN BOLUS VIA INFUSION
3000.0000 [IU] | Freq: Once | INTRAVENOUS | Status: AC
  Administered 2016-01-08: 3000 [IU] via INTRAVENOUS
  Filled 2016-01-08: qty 3000

## 2016-01-08 MED ORDER — SODIUM CHLORIDE 0.9% FLUSH: 3.0000 mL | Freq: Two times a day (BID) | INTRAVENOUS | Status: DC

## 2016-01-08 MED ORDER — AMITRIPTYLINE HCL 25 MG PO TABS
25.0000 mg | ORAL_TABLET | Freq: Every day | ORAL | Status: DC
  Administered 2016-01-08 – 2016-01-10 (×3): 25 mg via ORAL
  Filled 2016-01-08 (×3): qty 1

## 2016-01-08 MED ORDER — OXYCODONE-ACETAMINOPHEN 7.5-325 MG PO TABS
1.0000 | ORAL_TABLET | Freq: Three times a day (TID) | ORAL | Status: DC | PRN
  Administered 2016-01-08 – 2016-01-11 (×7): 1 via ORAL
  Filled 2016-01-08 (×7): qty 1

## 2016-01-08 MED ORDER — PNEUMOCOCCAL VAC POLYVALENT 25 MCG/0.5ML IJ INJ
0.5000 mL | INJECTION | INTRAMUSCULAR | Status: AC
  Administered 2016-01-11: 0.5 mL via INTRAMUSCULAR
  Filled 2016-01-08 (×2): qty 0.5

## 2016-01-08 MED ORDER — ATORVASTATIN CALCIUM 20 MG PO TABS: 20.0000 mg | ORAL_TABLET | Freq: Every day | ORAL | Status: DC

## 2016-01-08 MED ORDER — INSULIN ASPART 100 UNIT/ML ~~LOC~~ SOLN: 0.0000 [IU] | Freq: Every day | SUBCUTANEOUS | Status: DC

## 2016-01-08 MED ORDER — HEPARIN (PORCINE) IN NACL 100-0.45 UNIT/ML-% IJ SOLN
1100.0000 [IU]/h | INTRAMUSCULAR | Status: DC
  Filled 2016-01-08: qty 250

## 2016-01-08 MED ORDER — ATORVASTATIN CALCIUM 40 MG PO TABS
40.0000 mg | ORAL_TABLET | Freq: Every day | ORAL | Status: DC
Start: 2016-01-08 — End: 2016-01-11
  Administered 2016-01-08 – 2016-01-10 (×2): 40 mg via ORAL
  Filled 2016-01-08 (×2): qty 1

## 2016-01-08 MED ORDER — SODIUM CHLORIDE 0.9 % WEIGHT BASED INFUSION
1.0000 mL/kg/h | INTRAVENOUS | Status: DC
  Administered 2016-01-08: 1 mL/kg/h via INTRAVENOUS

## 2016-01-08 MED ORDER — IOPAMIDOL (ISOVUE-370) INJECTION 76%
INTRAVENOUS | Status: AC
  Administered 2016-01-08: 100 mL
  Filled 2016-01-08: qty 100

## 2016-01-08 MED ORDER — SODIUM CHLORIDE 0.9% FLUSH: 3.0000 mL | INTRAVENOUS | Status: DC | PRN

## 2016-01-08 MED ORDER — ASPIRIN 81 MG PO CHEW
81.0000 mg | CHEWABLE_TABLET | ORAL | Status: AC
  Administered 2016-01-09: 81 mg via ORAL
  Filled 2016-01-08: qty 1

## 2016-01-08 MED ORDER — NICOTINE 7 MG/24HR TD PT24
7.0000 mg | MEDICATED_PATCH | TRANSDERMAL | Status: DC
  Administered 2016-01-08 – 2016-01-10 (×3): 7 mg via TRANSDERMAL
  Filled 2016-01-08 (×4): qty 1

## 2016-01-08 MED ORDER — SODIUM CHLORIDE 0.9 % IV SOLN: 250.0000 mL | INTRAVENOUS | Status: DC | PRN

## 2016-01-08 NOTE — ED Notes (Signed)
MD at bedside. 

## 2016-01-08 NOTE — Consult Note (Signed)
Patient ID: Jealousy Rashed MRN: [id number], DOB/AGE: 07-21-1965   Admit date: 01/08/2016  Reason for Consult: Preoperative assessment Requesting M.D: Dr. Isac Sarna, Internal Medicine/ Dr. Trula Slade, Vascular Surgery.  Primary Physician: Elizabeth Palau, MD (Inactive) Primary Cardiologist: New (Dr. Radford Pax)   Pt. Profile:   50 year old female with history of type 2 diabetes, hypertension, thyroid cancer, FH of CAD (sister had an MI at age 68) and tobacco abuse, who presented to [facility] with complaint of left leg pain. 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 Plan is for thrombectomy 01/09/2016, per vascular surgery. Cardiology consult for preoperative clearance given the patient has had recent chest pain.  Problem List  Past Medical History:  Diagnosis Date  . Cancer of thyroid ([place])   . Diabetes mellitus without complication ([place])   . Hypertension   . Thyroid disease   . Vitreous floaters of left eye     Past Surgical History:  Procedure Laterality Date  . EYE SURGERY    . THYROID SURGERY  2008   Not sure what they did  . TUBAL LIGATION  1991     Allergies  Allergies  Allergen Reactions  . Bee Venom Shortness Of Breath    HPI   50 year old female with history of type 2 diabetes, hypertension, thyroid cancer, FH of CAD (sister had an MI at age 72) and tobacco abuse, who presented to [facility] with complaint of left leg pain. 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 Plan is for thrombectomy 01/09/2016, per vascular surgery. Cardiology consult for preoperative clearance given the patient has had recent chest pain.  She reports chest discomfort has been occurring for "a while now". Substernal pain. It occurres at rest but also worse with exertion. Described as a sharp pain. She also notes associated exertional dyspnea. She is unable to walk up a flight of stairs. She often has to stop due to chest pain and  shortness of breath. There is no documented history of atrial fibrillation however she reports occasional palpitations.  She is currently chest pain-free. EKG shows sinus rhythm with no ischemic abnormalities. CT angio noted moderate atheromatous plaque throughout the length of the aorta without aneurysm, dissection, or or high-grade stenosis.   Home Medications  Prior to Admission medications   Medication Sig Start Date End Date Taking? Authorizing Provider  amitriptyline (ELAVIL) 25 MG tablet Take 25 mg by mouth at bedtime. Reported on 07/19/2015   Yes Historical Provider, MD  amLODipine (NORVASC) 5 MG tablet Take 5 mg by mouth daily. Reported on 07/19/2015   Yes Historical Provider, MD  atorvastatin (LIPITOR) 20 MG tablet Take 20 mg by mouth daily. Reported on 12/11/2015 10/31/15  Yes Historical Provider, MD  DUREZOL 0.05 % EMUL Place 2 drops into the left eye daily. 11/11/15  Yes Historical Provider, MD  EDARBYCLOR 40-25 MG TABS Take 1 tablet by mouth daily. Reported on 12/11/2015 11/18/15  Yes Historical Provider, MD  furosemide (LASIX) 20 MG tablet Take 20 mg by mouth daily. 10/31/15  Yes Historical Provider, MD  gabapentin (NEURONTIN) 600 MG tablet Take 600 mg by mouth 2 (two) times daily. 10/31/15  Yes Historical Provider, MD  hydrochlorothiazide (HYDRODIURIL) 25 MG tablet Take 1 tablet (25 mg total) by mouth daily. 01/24/13  Yes Antonietta Breach, PA-C  levothyroxine (SYNTHROID, LEVOTHROID) 125 MCG tablet Take 125 mcg by mouth daily before breakfast.  07/08/15  Yes Historical Provider, MD  Liraglutide (VICTOZA) 18 MG/3ML SOPN Inject 0.6 mg  into the skin daily.   Yes Historical Provider, MD  lisinopril (PRINIVIL,ZESTRIL) 10 MG tablet Take 1 tablet (10 mg total) by mouth daily. 01/24/13  Yes Antonietta Breach, PA-C  metFORMIN (GLUCOPHAGE) 500 MG tablet Take 500 mg by mouth daily with breakfast. Reported on 12/11/2015   Yes Historical Provider, MD  oxyCODONE-acetaminophen (PERCOCET) 7.5-325 MG tablet Take 1 tablet by  mouth 3 (three) times daily as needed for moderate pain.  11/27/15   Historical Provider, MD    Family History  Family History  Problem Relation Age of Onset  . Breast cancer Mother   . CAD Sister 21    MI    Social History  Social History   Social History  . Marital status: Single    Spouse name: N/A  . Number of children: N/A  . Years of education: N/A   Occupational History  . Not on file.   Social History Main Topics  . Smoking status: Light Tobacco Smoker    Packs/day: 0.01    Types: Cigarettes  . Smokeless tobacco: Never Used  . Alcohol use No  . Drug use:     Frequency: 1.0 time per week    Types: Marijuana  . Sexual activity: Yes    Birth control/ protection: None, Surgical   Other Topics Concern  . Not on file   Social History Narrative  . No narrative on file     Review of Systems General:  No chills, fever, night sweats or weight changes.  Cardiovascular:  No chest pain, dyspnea on exertion, edema, orthopnea, palpitations, paroxysmal nocturnal dyspnea. Dermatological: No rash, lesions/masses Respiratory: No cough, dyspnea Urologic: No hematuria, dysuria Abdominal:   No nausea, vomiting, diarrhea, bright red blood per rectum, melena, or hematemesis Neurologic:  No visual changes, wkns, changes in mental status. All other systems reviewed and are otherwise negative except as noted above.  Physical Exam  Blood pressure 110/78, pulse 83, temperature 97.7 F (36.5 C), temperature source Oral, resp. rate 18, height 5\' 3"  (1.6 m), weight 169 lb 15.6 oz (77.1 kg), last menstrual period 03/21/2014, SpO2 100 %.  General: Pleasant, NAD Psych: Normal affect. Neuro: Alert and oriented X 3. Moves all extremities spontaneously. HEENT: Normal  Neck: Supple without bruits or JVD. Lungs:  Resp regular and unlabored, CTA. Heart: RRR no s3, s4, or murmurs. Abdomen: Soft, non-tender, non-distended, BS + x 4.  Extremities: No clubbing, cyanosis or edema. Radials  2+. Decreased DPs on left.  Labs  Troponin (Point of Care Test) No results for input(s): TROPIPOC in the last 72 hours. No results for input(s): CKTOTAL, CKMB, TROPONINI in the last 72 hours. Lab Results  Component Value Date   WBC 7.1 01/08/2016   HGB 12.6 01/08/2016   HCT 37.0 01/08/2016   MCV 93.4 01/08/2016   PLT 323 01/08/2016     Recent Labs Lab 01/08/16 1059 01/08/16 1108  NA 137 139  K 3.7 3.6  CL 102 101  CO2 26  --   BUN 22* 24*  CREATININE 1.06* 1.00  CALCIUM 9.4  --   GLUCOSE 106* 102*   Lab Results  Component Value Date   CHOL 306 (H) 06/12/2012   HDL 112 06/12/2012   LDLCALC 171 (H) 06/12/2012   TRIG 113 06/12/2012   Lab Results  Component Value Date   DDIMER 0.37 12/05/2015     Radiology/Studies  Ct Angio Ao+bifem W &/or Wo Contrast  Result Date: 01/08/2016 CLINICAL DATA:  Left foot numbness x4 days, left  leg pain x2 months EXAM: CT ANGIOGRAPHY ABDOMEN AND PELVIS CT ANGIOGRAPHY BILATERAL LOWER EXTREMITIES TECHNIQUE: Multidetector CT imaging of the abdomen and pelvis was performed using the standard protocol during bolus administration of intravenous contrast. Axial helical CT of bilateral lower extremities after power injection of intravenous contrast. Coronal and sagittal reconstructions were generated for vascular evaluation. Multiplanar reconstructed images including MIPs were obtained and reviewed to evaluate the vascular anatomy. CONTRAST:  100 mL Isovue 370 IV COMPARISON:  COMPARISON MR 07/19/2015, CT 03/03/2015 FINDINGS: ARTERIAL Aorta: Moderate atheromatous plaque throughout the length of the aorta without aneurysm, dissection, or or high-grade stenosis. Celiac axis: Short-segment stenosis at the level of the median arcuate ligament of the diaphragm, otherwise widely patent. Superior mesenteric: Noncalcified atheromatous plaque just beyond the origin resulting in short segment stenosis of at least mild severity, patent distally with classic distal  branch anatomy. Left renal:           Single, patent Right renal: Circumferential ostial plaque resulting in short segment stenosis of at least mild severity, patent distally. Inferior mesenteric:  Origin stenosis, widely patent distally. Left iliac: Proximal occlusion of the common iliac artery, with a string sign through the length of the external iliac artery. Apparent long segment occlusion of the internal iliac artery. Right iliac: Mild atheromatous irregularity through the common iliac artery. Long segment origin occlusion of the internal iliac artery. Mild plaque through the length of the external iliac artery without high-grade stenosis. Left lower extremity: Eccentric mild plaque in the common femoral artery. Deep femoral branches patent. Plaque through the SFA with tandem areas of at least mild stenosis. Popliteal artery is patent. Anterior tibial and peroneal runoff. Posterior tibial artery occludes at the lower calf level. Right lower extremity: Common femoral artery has some eccentric nonocclusive plaque. Deep femoral branches patent. There is moderate plaque throughout the SFA resulting in long segment moderate stenosis in the mid and distal SFA, with occlusion at the level of the adductor hiatus. There is collateral reconstitution of the popliteal artery above the knee, patent across the knee with peroneal and posterior tibial runoff. The anterior tibial artery occludes mid calf. VENOUS: Dedicated dedicated venous phase imaging not obtained. Review of the MIP images confirms the above findings. NONVASCULAR Lower chest: Mild dependent atelectasis posteriorly in both lower lobes. No pleural or pericardial effusion. Hepatobiliary: No masses or other significant abnormality. Pancreas: No mass, inflammatory changes, or other significant abnormality. Spleen: Within normal limits in size and appearance. Adrenals/Urinary Tract: 4.2 cm right adrenal adenoma, as previously characterized. Left adrenal  unremarkable. Kidneys unremarkable. No hydronephrosis or mass. No urolithiasis. Urinary bladder physiologically distended. Stomach/Bowel: No evidence of obstruction, inflammatory process, or abnormal fluid collections. Normal appendix. Lymphatic: No pathologically enlarged lymph nodes. Reproductive: NewNo mass or other significant abnormality. Other: No ascites.  No free air. Musculoskeletal: Mild degenerative disc disease in the visualized lower thoracic spine. Mild spondylitic changes in the lower lumbar spine. Negative for fracture. IMPRESSION: 1. 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 2. Two vessel LEFT lower extremity tibial runoff. 3. Progressive long segment arterial occlusive disease in the RIGHT SFA and proximal popliteal artery, reconstituted distally with 2 vessel tibial runoff. Electronically Signed   By: Lucrezia Europe M.D.   On: 01/08/2016 13:57    ECG  NSR. No ischemic abnormalities.   ASSESSMENT AND PLAN  Active Problems:   Iliac artery occlusion (H[facility])   Diabetes (H[facility])   HTN (hypertension)   Hypothyroidism  PVD (peripheral vascular disease) ([place])  1. Preoperative assessment: Pt with recent substernal chest discomfort worse with exertion. Also with exertional dyspnea. She has multiple cardiac risk factors including insulin-dependent type 2 diabetes, tobacco abuse and strong family history of premature CAD in first-degree relative. Her sister had a myocardial infarction at age 46. CT angio noted Moderate atheromatous plaque throughout the length of the aorta without aneurysm, dissection, or or high-grade stenosis. With the patient's history and CT findings, there is concern for coronary artery disease. Dr. Radford Pax has seen patient. We will plan for definitive LHC, scheduled as fist case tomorrow morning with Dr. Tamala Julian. Will use right radial access.   Signed, Lyda Jester, PA-C 01/08/2016, 6:12 PM

## 2016-01-08 NOTE — Progress Notes (Signed)
ANTICOAGULATION CONSULT NOTE - Initial Consult  Pharmacy Consult for heparin Indication: iliac artery occlusion  Allergies  Allergen Reactions  . Bee Venom Shortness Of Breath    Patient Measurements: Height: 5\' 3"  (160 cm) Weight: 169 lb 15.6 oz (77.1 kg) IBW/kg (Calculated) : 52.4 Heparin Dosing Weight: 69kg  Vital Signs: Temp: 98.1 F (36.7 C) (08/03 0856) Temp Source: Oral (08/03 0856) BP: 99/79 (08/03 1359) Pulse Rate: 80 (08/03 1359)  Labs:  Recent Labs  01/08/16 1059 01/08/16 1108  HGB 11.9* 12.6  HCT 37.0 37.0  PLT 323  --   CREATININE 1.06* 1.00    Estimated Creatinine Clearance: 66.9 mL/min (by C-G formula based on SCr of 1 mg/dL).   Assessment: 83 YOF with history of diabetes who presents with numbness and pain to both feet x3 days without any recent injury or trauma. To start IV heparin for iliac artery occlusion.  Baseline Hgb 11.9 with plts of 323. No bleeding noted. She is not on anticoagulation PTA.   Goal of Therapy:  Heparin level 0.3-0.7 units/ml Monitor platelets by anticoagulation protocol: Yes   Plan:  -start heparin with bolus of 3000 units IV x1, then start infusion at 900 units/hr (13units/kg/hr) -heparin level in 6h -daily HL and CBC -follow for s/s bleeding and vascular plans  Nahshon Reich D. Makinsley Schiavi, PharmD, BCPS Clinical Pharmacist Pager: [phone] 01/08/2016 2:17 PM

## 2016-01-08 NOTE — ED Notes (Signed)
Care handoff given to [place], [place] RN

## 2016-01-08 NOTE — ED Triage Notes (Addendum)
Pt here for numbness and pain to both feet. sts she has diabetes. sts x 3 days. Denies hx of same. sts that she has been taking her meds.

## 2016-01-08 NOTE — H&P (Signed)
Date: 01/08/2016               Patient Name:  Jordan Harvey MRN: [id number]  DOB: 1966-03-24 Age / Sex: 50 y.o., female   PCP: Elizabeth Palau, MD (Inactive)         Medical Service: Internal Medicine Teaching Service         Attending Physician: Dr. Axel Filler, MD    First Contact: Welford Roche, MS4 Pager: [phone]  Second Contact: Dr. Dellia Nims  Pager: [phone]       After Hours (After 5p/  First Contact Pager: [phone]  weekends / holidays): Second Contact Pager: [phone]   Chief Complaint: L leg pain   History of Present Illness:  Ms. Avynn Splane. Brandli is a 50 y.o. female with history of T2DM, HTN, thyroid cancer, and smoking who presents with L leg pain that started acutely 3 days ago. The pain starts in her L calf and travels down to the L foot. She describes it as constant. Reports difficulty walking because she is unable to bear weight on her L foot due to numbness and tingling sensation. She has not tried anything for the pain and she cant identify alleviating factors. Denies symptoms in her right leg. She also reports pain in her R hip that started 2 months ago. She localizes it to the lateral side. States she visited her PCP a few weeks ago and received a steroid injection in posterior hip, but no improvement in pain.  She endorses intermittent chest pain that occurs both on exertion and at rest and lasts 20-30 minutes. States the pain sometimes radiates to her L arm, but not associated with N/V or diaphoresis. Denies chest pain today.   Patient states she is compliant with medications, however per EMR, patient having a difficult time taking her pills at home.   She denied fever, chills, night sweats, chest pain, SOB, cough, abdominal pain, changes in BMs, changes in appetite, and weight loss.   ED: Vascular surgery consulted. CT angiogram ordered with evidence of L iliac artery occlusion. Plan to do thrombectomy tomorrow , but would like  cardiology to clear her for surgery. Started on hepatin gtt.   Review of Systems: A complete ROS was negative except as per HPI.   PMH: HTN  T2DM - on metformin and victoza Thyroid cancer  Traumatic L eye injury   PSH:  L eye surgery with removal of L eye  Thyroid surgery 2008 BTL 1991   Meds:  Prescriptions Prior to Admission  Medication Sig Dispense Refill Last Dose  . amitriptyline (ELAVIL) 25 MG tablet Take 25 mg by mouth at bedtime. Reported on 07/19/2015   Past Week at Unknown time  . amLODipine (NORVASC) 5 MG tablet Take 5 mg by mouth daily. Reported on 07/19/2015   01/07/2016 at Unknown time  . atorvastatin (LIPITOR) 20 MG tablet Take 20 mg by mouth daily. Reported on 12/11/2015  0 01/07/2016 at Unknown time  . DUREZOL 0.05 % EMUL Place 2 drops into the left eye daily.  1 01/07/2016 at Unknown time  . EDARBYCLOR 40-25 MG TABS Take 1 tablet by mouth daily. Reported on 12/11/2015  5 01/07/2016 at Unknown time  . furosemide (LASIX) 20 MG tablet Take 20 mg by mouth daily.  3 01/07/2016 at Unknown time  . gabapentin (NEURONTIN) 600 MG tablet Take 600 mg by mouth 2 (two) times daily.  5 01/07/2016 at Unknown time  . hydrochlorothiazide (HYDRODIURIL) 25 MG  tablet Take 1 tablet (25 mg total) by mouth daily. 30 tablet 1 01/07/2016 at Unknown time  . levothyroxine (SYNTHROID, LEVOTHROID) 125 MCG tablet Take 125 mcg by mouth daily before breakfast.    01/07/2016 at Unknown time  . Liraglutide (VICTOZA) 18 MG/3ML SOPN Inject 0.6 mg into the skin daily.   01/07/2016 at Unknown time  . lisinopril (PRINIVIL,ZESTRIL) 10 MG tablet Take 1 tablet (10 mg total) by mouth daily. 30 tablet 1 01/07/2016 at Unknown time  . metFORMIN (GLUCOPHAGE) 500 MG tablet Take 500 mg by mouth daily with breakfast. Reported on 12/11/2015   01/07/2016 at Unknown time  . oxyCODONE-acetaminophen (PERCOCET) 7.5-325 MG tablet Take 1 tablet by mouth 3 (three) times daily as needed for moderate pain.   0 Unknown at Unknown    Allergies: Allergies  as of 01/08/2016 - Review Complete 01/08/2016  Allergen Reaction Noted  . Bee venom Shortness Of Breath 04/11/2012   Past Medical History:  Diagnosis Date  . Cancer of thyroid ([place])   . Diabetes mellitus without complication ([place])   . Hypertension   . Thyroid disease   . Vitreous floaters of left eye     Family History:  Breast cancer - mom  History of clot in sister   Social History:  Denies alcohol use. She has a 67yr smoking history. Currently smokes 1 pack every 2 weeks, but used to smoke 1 pack/day years ago. Reports current marihuana use. Has experimented with cocaine in the past, but denied current use.   Physical Exam: Blood pressure 110/78, pulse 83, temperature 97.7 F (36.5 C), temperature source Oral, resp. rate 18, height 5\' 3"  (1.6 m), weight 77.1 kg (169 lb 15.6 oz), last menstrual period 03/21/2014, SpO2 100 %.  General: laying on her R side in bed, in NAD Head: NCAT Eyes: L eye with EOMI, R eye is artificial CV: distant heart sounds, RRR, no murmurs appreciated  Chest:  CTAB, no wheezes or crackles, no increased of bwork reathing  Abd: soft, NTND, +BS, no organomegaly noted  Extremities: L foot pale and cool to the touch with delayed capillary refill noted. R leg warm and well perfused.  Pulses: L dorsalis pedis pulse absent, R dorsalis pedis 1+, tibialis posterior 2+ bilaterally MSK:  Normal bulk and tone x4, R lateral hip tender to palpation and with medial and lateral rotation of leg  Neuro: A&Ox3, decreased sensation on L foot, no sensation abnormalities on the R foot, no LE motor deficits bilaterally   Labs:    Ref. Range 01/08/2016 10:59  WBC Latest Ref Range: 4.0 - 10.5 K/uL 7.1  RBC Latest Ref Range: 3.87 - 5.11 MIL/uL 3.96  Hemoglobin Latest Ref Range: 12.0 - 15.0 g/dL 11.9 (L)  HCT Latest Ref Range: 36.0 - 46.0 % 37.0  MCV Latest Ref Range: 78.0 - 100.0 fL 93.4  MCH Latest Ref Range: 26.0 - 34.0 pg 30.1  MCHC Latest Ref Range: 30.0 - 36.0 g/dL 32.2   RDW Latest Ref Range: 11.5 - 15.5 % 14.7  Platelets Latest Ref Range: 150 - 400 K/uL 323     Ref. Range 01/08/2016 11:08  Sodium Latest Ref Range: 135 - 145 mmol/L 139  Potassium Latest Ref Range: 3.5 - 5.1 mmol/L 3.6  Chloride Latest Ref Range: 101 - 111 mmol/L 101  BUN Latest Ref Range: 6 - 20 mg/dL 24 (H)  Creatinine Latest Ref Range: 0.44 - 1.00 mg/dL 1.00  Glucose Latest Ref Range: 65 - 99 mg/dL 102 (H)  Calcium  Ionized Latest Ref Range: 1.13 - 1.30 mmol/L 1.20   EKG:  On my review - SR, left axis deviation, J point elevation on V3, no Q waves, no T wave inversion or ST elevations   Imaging:  1. CT angiogram 01/08/2016:   Progressive arterial occlusive disease through the LEFT iliac arterial system Two vessel LEFT lower extremity tibial runoff. Progressive long segment arterial occlusive disease in the RIGHT SFA and proximal popliteal artery, reconstituted distally with 2 vessel tibial runoff. 4.2 cm R adrenal mass, stable from prior  Assessment & Plan by Problem: Active Problems:   Iliac artery occlusion (HCC)   Diabetes (HCC)   HTN (hypertension)   Hypothyroidism   PVD (peripheral vascular disease) ([place])  Ms. Laramee is a 50 y.o. Female with history of T2DM, HTN, thyroid cancer s/p surgery (unclear if thyroidectomy) and current smoker who presents with acute L leg pain associated with L foot pallor and absent dorsalis pedis pulse on palpation + absent Doppler US waveform concerning for acute limb ischemia. In the ED, she was found to have a arterial occlusive disease through the L iliac arterial system on CT angiogram. Her risk factors include: T2DM, HTN, and smoking.   1.Acute limb ischemia, viable to threatened: Acute L leg pain of 3 day duration associated with L foot pallor with coolness to touch and absent dorsalis pedis pulse on palpation and on Doppler US. Minimal pain on palpation and no motor deficits, but decreased sensation in L foot.  - Risk factors: T2 IDDM, HTN,  smoking   - CT angiogram with L iliac arterial system occlusion  - Vascular surgery following with plan for thrombectomy tomorrow  - Cardiology consulted for pre-op evaluation  - Heparin gtt with daily CBC  - Continue home amitryptiline, gabapentin, and percocet  for pain   2. Intermittent chest pain: No known cardiac history, but reports intermittent chest pain on exertion and at rest. Denies chest pain on this admission. No findings concerning for ischemia on EKG. Low suspicion for ACS.  - Cardiology consult as above, plan for [facility] tomorrow morning given multiple risk factors for CAD and family history of MI in sister at age 66 - Will monitor for chest pain   3. T2DM - F/u HbA1c, lipid panel, and BMP  - SSI   4. HTN: normotensive on admission  - Holding home antihypertensive medications   5. Hx of thyroid cancer: type of cancer unknown, unclear if she had thyroidectomy  - Continue home synthroid 15mcg daily  - F/u TSH    6. Polysubstance abuse: Current smoker and THC user + cocaine use in the past  - Nicotine patch 7mg   - F/u UDS  - F/u HIV   Dispo: Admit patient to Inpatient with expected length of stay greater than 2 midnights.  Signed: Welford Roche, Medical Student 01/08/2016, 6:21 PM  Pager: [phone]

## 2016-01-08 NOTE — Consult Note (Signed)
Consult Note  Patient name: Jordan Harvey MRN: [id number] DOB: [birth year] Sex: female  Consulting Physician:  ER  Reason for Consult:  Chief Complaint  Patient presents with  . Foot Pain    HISTORY OF PRESENT ILLNESS: This is a 50 year old female who presented to the ER with complaints of right leg numbness and pain for approximately the past week..  The pain starts in her L calf and travels down to the L foot. She describes it as constant. Reports difficulty walking because she is unable to bear weight on her L foot due to numbness and tingling sensation. She has not tried anything for the pain and she cant identify alleviating factors. Denies symptoms in her right leg.  A CTA reveals left iliac occlusion.  She has been started on heparin.  She is a diabetic and reports her blood sugars to be around 110.  She is a smoker.  She is medically managed for hypertension.  She is on a statin for hypercholesterolemia.  Past Medical History:  Diagnosis Date  . Cancer of thyroid ([place])   . Diabetes mellitus without complication ([place])   . Hypertension   . Thyroid disease   . Vitreous floaters of left eye     Past Surgical History:  Procedure Laterality Date  . EYE SURGERY    . THYROID SURGERY  2008   Not sure what they did  . TUBAL LIGATION  1991    Social History   Social History  . Marital status: Single    Spouse name: N/A  . Number of children: N/A  . Years of education: N/A   Occupational History  . Not on file.   Social History Main Topics  . Smoking status: Light Tobacco Smoker    Packs/day: 0.01    Types: Cigarettes  . Smokeless tobacco: Never Used  . Alcohol use No  . Drug use:     Frequency: 1.0 time per week    Types: Marijuana  . Sexual activity: Yes    Birth control/ protection: None, Surgical   Other Topics Concern  . Not on file   Social History Narrative  . No narrative on file    Family History  Problem Relation Age of Onset  .  Breast cancer Mother   . CAD Sister 35    MI    Allergies as of 01/08/2016 - Review Complete 01/08/2016  Allergen Reaction Noted  . Bee venom Shortness Of Breath 04/11/2012    No current facility-administered medications on file prior to encounter.    Current Outpatient Prescriptions on File Prior to Encounter  Medication Sig Dispense Refill  . amitriptyline (ELAVIL) 25 MG tablet Take 25 mg by mouth at bedtime. Reported on 07/19/2015    . amLODipine (NORVASC) 5 MG tablet Take 5 mg by mouth daily. Reported on 07/19/2015    . atorvastatin (LIPITOR) 20 MG tablet Take 20 mg by mouth daily. Reported on 12/11/2015  0  . EDARBYCLOR 40-25 MG TABS Take 1 tablet by mouth daily. Reported on 12/11/2015  5  . furosemide (LASIX) 20 MG tablet Take 20 mg by mouth daily.  3  . gabapentin (NEURONTIN) 600 MG tablet Take 600 mg by mouth 2 (two) times daily.  5  . hydrochlorothiazide (HYDRODIURIL) 25 MG tablet Take 1 tablet (25 mg total) by mouth daily. 30 tablet 1  . levothyroxine (SYNTHROID, LEVOTHROID) 125 MCG tablet Take 125 mcg by mouth daily before breakfast.     .  Liraglutide (VICTOZA) 18 MG/3ML SOPN Inject 0.6 mg into the skin daily.    Marland Kitchen lisinopril (PRINIVIL,ZESTRIL) 10 MG tablet Take 1 tablet (10 mg total) by mouth daily. 30 tablet 1  . metFORMIN (GLUCOPHAGE) 500 MG tablet Take 500 mg by mouth daily with breakfast. Reported on 12/11/2015    . oxyCODONE-acetaminophen (PERCOCET) 7.5-325 MG tablet Take 1 tablet by mouth 3 (three) times daily as needed for moderate pain.   0     REVIEW OF SYSTEMS: Cardiovascular: No chest pain, chest pressure, palpitations, orthopnea, or dyspnea on exertion. Left foot pain and numbness Pulmonary: No productive cough, asthma or wheezing. Neurologic: No weakness, paresthesias, aphasia, or amaurosis. No dizziness. Hematologic: No bleeding problems or clotting disorders. Musculoskeletal: No joint pain or joint swelling. Gastrointestinal: No blood in stool or  hematemesis Genitourinary: No dysuria or hematuria. Psychiatric:: No history of major depression. Integumentary: No rashes or ulcers. Constitutional: No fever or chills.  PHYSICAL EXAMINATION: General: The patient appears their stated age.  Vital signs are BP (!) 88/63 (BP Location: Left Arm) Comment: Rn requested to retake   Pulse 88   Temp 97.7 F (36.5 C) (Oral)   Resp 18   Ht 5\' 3"  (1.6 m)   Wt 169 lb 15.6 oz (77.1 kg)   LMP 03/21/2014   SpO2 96%   BMI 30.11 kg/m  Pulmonary: Respirations are non-labored HEENT:  No gross abnormalities Abdomen: Soft and non-tender  Musculoskeletal: There are no major deformities.   Neurologic: sensation to the left foot is present but decreased.  She has normal motor function of the left foot Skin: There are no ulcer or rashes noted. Psychiatric: The patient has normal affect. Cardiovascular: There is a regular rate and rhythm without significant murmur appreciated.  Monophasic left PT doppler signal.  Palpable right femoral pulse, non-palpable left femoral  Diagnostic Studies: I have reviewed her CTA with the following fidings: 1. 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 2. Two vessel LEFT lower extremity tibial runoff. 3. Progressive long segment arterial occlusive disease in the RIGHT SFA and proximal popliteal artery, reconstituted distally with 2 vessel tibial runoff.    Assessment:  Left Iliac olllusion Plan: I believe she is suffering from left foot numbness secondary to a recent occlusion of her left iliac artery.  She does not have an acutely ischemic left foot that requires emergent intervention, therefore I would like her to be evaluate by cardiology, in case she requires femoral femoral bypass.  Cardiology has seen her and recommends heart cath tomorrow.  Following her cath, I will try to recanalize and stent her left iliac occlusion.  If I am unsuccessful, I would proceed  with femoral femoral bypass.  This is for limb salvage.  I discussed this with the patient in the ER and again in her room.  Her foot has improved a left with IV heparin.  NPO after midnight     V. Leia Alf, M.D. [facility] Office: [phone] Pager:  [phone]

## 2016-01-08 NOTE — ED Notes (Signed)
PT transported to [facility] via WC

## 2016-01-08 NOTE — ED Provider Notes (Signed)
[facility] DEPT Provider Note   CSN: [id number] Arrival date & time: 01/08/16  [id number]  First Provider Contact: 01/08/2016 9:27 AM   By signing my name below, I, Jasmyn B. Alexander, attest that this documentation has been prepared under the direction and in the presence of Montine Circle, PA-C. Electronically Signed: Tedra Coupe. Sheppard Coil, ED Scribe. 01/08/16. 9:37 AM.  History   Chief Complaint Chief Complaint  Patient presents with  . Foot Pain    HPI HPI Comments: Jordan Harvey is a 50 y.o. female with PMhx of Diabetes and HTN who presents to the Emergency Department complaining of gradual onset, constant, left foot numbness x 3 days. Pt reports she is unable to bear weight on foot due to numbness and tingling. Denies any recent injury to extremity. She reports decreased sensation from her left ankle to her toes. Pt notes that symptoms are not present in right foot. No alleviating factors noted.    The history is provided by the patient. No language interpreter was used.   Past Medical History:  Diagnosis Date  . Cancer of thyroid ([place])   . Diabetes mellitus without complication ([place])   . Hypertension   . Thyroid disease   . Vitreous floaters of left eye     There are no active problems to display for this patient.   Past Surgical History:  Procedure Laterality Date  . EYE SURGERY    . THYROID SURGERY  2008   Not sure what they did  . TUBAL LIGATION  1991    OB History    No data available       Home Medications    Prior to Admission medications   Medication Sig Start Date End Date Taking? Authorizing Provider  amitriptyline (ELAVIL) 25 MG tablet Take 25 mg by mouth at bedtime. Reported on 07/19/2015    Historical Provider, MD  amLODipine (NORVASC) 5 MG tablet Take 5 mg by mouth daily. Reported on 07/19/2015    Historical Provider, MD  atorvastatin (LIPITOR) 20 MG tablet Take 20 mg by mouth daily. Reported on 12/11/2015 10/31/15   Historical Provider, MD    EDARBYCLOR 40-25 MG TABS Take 1 tablet by mouth daily. Reported on 12/11/2015 11/18/15   Historical Provider, MD  furosemide (LASIX) 20 MG tablet Take 20 mg by mouth daily. 10/31/15   Historical Provider, MD  gabapentin (NEURONTIN) 600 MG tablet Take 600 mg by mouth 2 (two) times daily. 10/31/15   Historical Provider, MD  hydrochlorothiazide (HYDRODIURIL) 25 MG tablet Take 1 tablet (25 mg total) by mouth daily. 01/24/13   Antonietta Breach, PA-C  levothyroxine (SYNTHROID, LEVOTHROID) 125 MCG tablet Take 125 mcg by mouth daily before breakfast.  07/08/15   Historical Provider, MD  Liraglutide (VICTOZA) 18 MG/3ML SOPN Inject 0.6 mg into the skin daily.    Historical Provider, MD  lisinopril (PRINIVIL,ZESTRIL) 10 MG tablet Take 1 tablet (10 mg total) by mouth daily. 01/24/13   Antonietta Breach, PA-C  metFORMIN (GLUCOPHAGE) 500 MG tablet Take 500 mg by mouth daily with breakfast. Reported on 12/11/2015    Historical Provider, MD  oxyCODONE-acetaminophen (PERCOCET) 7.5-325 MG tablet Take 1 tablet by mouth 3 (three) times daily as needed. 11/27/15   Historical Provider, MD    Family History Family History  Problem Relation Age of Onset  . Breast cancer Mother     Social History Social History  Substance Use Topics  . Smoking status: Light Tobacco Smoker    Packs/day: 0.01    Types: Cigarettes  .  Smokeless tobacco: Never Used  . Alcohol use No     Allergies   Bee venom   Review of Systems Review of Systems  Musculoskeletal: Positive for gait problem.  Neurological: Positive for numbness.  All other systems reviewed and are negative.  Physical Exam Updated Vital Signs BP 123/74 (BP Location: Right Arm)   Pulse 107   Temp 98.1 F (36.7 C) (Oral)   Resp 18   LMP 03/21/2014   SpO2 95%   Physical Exam  Constitutional: She is oriented to person, place, and time. She appears well-developed and well-nourished.  HENT:  Head: Normocephalic and atraumatic.  Eyes: Conjunctivae and EOM are normal. Pupils  are equal, round, and reactive to light.  Neck: Normal range of motion. Neck supple.  Cardiovascular: Normal rate and regular rhythm.  Exam reveals no gallop and no friction rub.   No murmur heard. Left dorsalis pedis pulse is absent to palpation and absent Doppler waveform Left posterior tibialis pulses absent to palpation, but barely appreciable with Doppler  Pulmonary/Chest: Effort normal and breath sounds normal. No respiratory distress. She has no wheezes. She has no rales. She exhibits no tenderness.  Abdominal: Soft. Bowel sounds are normal. She exhibits no distension and no mass. There is no tenderness. There is no rebound and no guarding.  Musculoskeletal: Normal range of motion. She exhibits no edema or tenderness.  Neurological: She is alert and oriented to person, place, and time.  Left foot sensation decreased compared to right  Skin: Skin is dry.  Left foot is cool to the touch, colder than right  Psychiatric: She has a normal mood and affect. Her behavior is normal. Judgment and thought content normal.  Nursing note and vitals reviewed.    ED Treatments / Results  DIAGNOSTIC STUDIES: Oxygen Saturation is 95% on RA, adequate by my interpretation.    COORDINATION OF CARE: 9:30 AM-Discussed treatment plan which includes [place] of left foot with pt at bedside and pt agreed to plan.   Labs (all labs ordered are listed, but only abnormal results are displayed) Labs Reviewed  CBC WITH DIFFERENTIAL/PLATELET - Abnormal; Notable for the following:       Result Value   Hemoglobin 11.9 (*)    All other components within normal limits  BASIC METABOLIC PANEL - Abnormal; Notable for the following:    Glucose, Bld 106 (*)    BUN 22 (*)    Creatinine, Ser 1.06 (*)    All other components within normal limits  CBG MONITORING, ED - Abnormal; Notable for the following:    Glucose-Capillary 118 (*)    All other components within normal limits  I-STAT CHEM 8, ED - Abnormal; Notable for  the following:    BUN 24 (*)    Glucose, Bld 102 (*)    All other components within normal limits  PROTIME-INR  HEPARIN LEVEL (UNFRACTIONATED)  I-STAT BETA HCG BLOOD, ED ([facility], [facility], [facility] ONLY)    Radiology Ct Angio Ao+bifem W &/or Wo Contrast  Result Date: 01/08/2016 CLINICAL DATA:  Left foot numbness x4 days, left leg pain x2 months EXAM: CT ANGIOGRAPHY ABDOMEN AND PELVIS CT ANGIOGRAPHY BILATERAL LOWER EXTREMITIES TECHNIQUE: Multidetector CT imaging of the abdomen and pelvis was performed using the standard protocol during bolus administration of intravenous contrast. Axial helical CT of bilateral lower extremities after power injection of intravenous contrast. Coronal and sagittal reconstructions were generated for vascular evaluation. Multiplanar reconstructed images including MIPs were obtained and reviewed to evaluate the vascular anatomy. CONTRAST:  100 mL Isovue 370 IV COMPARISON:  COMPARISON MR 07/19/2015, CT 03/03/2015 FINDINGS: ARTERIAL Aorta: Moderate atheromatous plaque throughout the length of the aorta without aneurysm, dissection, or or high-grade stenosis. Celiac axis: Short-segment stenosis at the level of the median arcuate ligament of the diaphragm, otherwise widely patent. Superior mesenteric: Noncalcified atheromatous plaque just beyond the origin resulting in short segment stenosis of at least mild severity, patent distally with classic distal branch anatomy. Left renal:           Single, patent Right renal: Circumferential ostial plaque resulting in short segment stenosis of at least mild severity, patent distally. Inferior mesenteric:  Origin stenosis, widely patent distally. Left iliac: Proximal occlusion of the common iliac artery, with a string sign through the length of the external iliac artery. Apparent long segment occlusion of the internal iliac artery. Right iliac: Mild atheromatous irregularity through the common iliac artery. Long segment origin occlusion of the internal  iliac artery. Mild plaque through the length of the external iliac artery without high-grade stenosis. Left lower extremity: Eccentric mild plaque in the common femoral artery. Deep femoral branches patent. Plaque through the SFA with tandem areas of at least mild stenosis. Popliteal artery is patent. Anterior tibial and peroneal runoff. Posterior tibial artery occludes at the lower calf level. Right lower extremity: Common femoral artery has some eccentric nonocclusive plaque. Deep femoral branches patent. There is moderate plaque throughout the SFA resulting in long segment moderate stenosis in the mid and distal SFA, with occlusion at the level of the adductor hiatus. There is collateral reconstitution of the popliteal artery above the knee, patent across the knee with peroneal and posterior tibial runoff. The anterior tibial artery occludes mid calf. VENOUS: Dedicated dedicated venous phase imaging not obtained. Review of the MIP images confirms the above findings. NONVASCULAR Lower chest: Mild dependent atelectasis posteriorly in both lower lobes. No pleural or pericardial effusion. Hepatobiliary: No masses or other significant abnormality. Pancreas: No mass, inflammatory changes, or other significant abnormality. Spleen: Within normal limits in size and appearance. Adrenals/Urinary Tract: 4.2 cm right adrenal adenoma, as previously characterized. Left adrenal unremarkable. Kidneys unremarkable. No hydronephrosis or mass. No urolithiasis. Urinary bladder physiologically distended. Stomach/Bowel: No evidence of obstruction, inflammatory process, or abnormal fluid collections. Normal appendix. Lymphatic: No pathologically enlarged lymph nodes. Reproductive: NewNo mass or other significant abnormality. Other: No ascites.  No free air. Musculoskeletal: Mild degenerative disc disease in the visualized lower thoracic spine. Mild spondylitic changes in the lower lumbar spine. Negative for fracture. IMPRESSION: 1.  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 2. Two vessel LEFT lower extremity tibial runoff. 3. Progressive long segment arterial occlusive disease in the RIGHT SFA and proximal popliteal artery, reconstituted distally with 2 vessel tibial runoff. Electronically Signed   By: Lucrezia Europe M.D.   On: 01/08/2016 13:57    Procedures Procedures (including critical care time)  Medications Ordered in ED Medications - No data to display   Initial Impression / Assessment and Plan / ED Course  I have reviewed the triage vital signs and the nursing notes.  Pertinent labs & imaging results that were available during my care of the patient were reviewed by me and considered in my medical decision making (see chart for details).  Clinical Course    Patient with concern for absent pulses in left foot with numbness and pain 2 days. Patient seen by and discussed with Dr. Ellender Hose, who recommends consultation with vascular surgery.  Appreciate  Dr. Trula Slade for coming to see the patient.  Dr. Trula Slade recommends CT angio abd/pel with runoff. Likely admit to medicine.   Appreciate internal medicine team for admitting the patient.  Heparin for occluded left iliac and left foot numbness no palpable pulse CRITICAL CARE Performed by: Montine Circle   Total critical care time: 45 minutes  Critical care time was exclusive of separately billable procedures and treating other patients.  Critical care was necessary to treat or prevent imminent or life-threatening deterioration.  Critical care was time spent personally by me on the following activities: development of treatment plan with patient and/or surrogate as well as nursing, discussions with consultants, evaluation of patient's response to treatment, examination of patient, obtaining history from patient or surrogate, ordering and performing treatments and interventions, ordering and review of  laboratory studies, ordering and review of radiographic studies, pulse oximetry and re-evaluation of patient's condition.   Final Clinical Impressions(s) / ED Diagnoses   Final diagnoses:  Numbness of left foot  Iliac artery occlusion [facility])    New Prescriptions New Prescriptions   No medications on file   I personally performed the services described in this documentation, which was scribed in my presence. The recorded information has been reviewed and is accurate.       Montine Circle, PA-C 01/08/16 1442    Duffy Bruce, MD 01/08/16 2051

## 2016-01-08 NOTE — Progress Notes (Signed)
ANTICOAGULATION CONSULT NOTE  Pharmacy Consult for heparin Indication: iliac artery occlusion  Allergies  Allergen Reactions  . Bee Venom Shortness Of Breath    Patient Measurements: Height: 5\' 3"  (160 cm) Weight: 169 lb 15.6 oz (77.1 kg) IBW/kg (Calculated) : 52.4 Heparin Dosing Weight: 69kg  Vital Signs: Temp: 97.7 F (36.5 C) (08/03 1707) Temp Source: Oral (08/03 1707) BP: 88/63 (08/03 2217) Pulse Rate: 88 (08/03 2217)  Labs:  Recent Labs  01/08/16 1059 01/08/16 1108 01/08/16 2109  HGB 11.9* 12.6  --   HCT 37.0 37.0  --   PLT 323  --   --   LABPROT 12.2  --   --   INR 0.90  --   --   HEPARINUNFRC  --   --  0.13*  CREATININE 1.06* 1.00  --     Estimated Creatinine Clearance: 66.9 mL/min (by C-G formula based on SCr of 1 mg/dL).   Assessment: 15 YOF with history of diabetes who presents with numbness and pain to both feet x3 days without any recent injury or trauma. To start IV heparin for iliac artery occlusion.  Initial heparin level low at 0.13 on 900 units/hr. No bleeding noted, no issues noted. Appears that plan is for cath in am.  Goal of Therapy:  Heparin level 0.3-0.7 units/ml Monitor platelets by anticoagulation protocol: Yes   Plan:  -Increase heparin to 1100 units/hr -heparin level in 6h -daily HL and CBC -follow for s/s bleeding and vascular plans  Erin Hearing PharmD., BCPS Clinical Pharmacist Pager [phone] 01/08/2016 10:23 PM

## 2016-01-08 NOTE — ED Notes (Signed)
Attempted report x1. 

## 2016-01-08 NOTE — H&P (Signed)
Date: 01/08/2016               Patient Name:  Jordan Harvey MRN: [id number]  DOB: January 05, 1966 Age / Sex: 50 y.o., female   PCP: Elizabeth Palau, MD (Inactive)         Medical Service: Internal Medicine Teaching Service         Attending Physician: Dr. Axel Filler, MD    First Contact: Dr. Genene Churn Pager: [phone]            After Hours (After 5p/  First Contact Pager: [phone]  weekends / holidays): Second Contact Pager: [phone]   Chief Complaint: left foot pain/numbness  History of Present Illness:   50 yo f with hx of hypothyroidism, HTN, DM II,  presents with left leg pain for 3 days. She had left hip pain in the past, received some injection (sounds like steroid injection), without much improvement. Continued to have this pain but then 3 days ago all the sudden she started having pain on her left calf radiating down to her left foot, feels like pins and needles with some numb feeling. The pain is constant. No back pain, no weight loss, no bowel or bladder incontinence, fever, chills, or other problems. Denies any hx of similar pain in the past, no hx of dvt or PAD per patient. Has lot of meds in her medication list for HTN but per chart review there has been noncompliance with meds in the past. Denies any cardiac hx but states occasional chest pain lasting 20-30 mins, last was few weeks ago.   Sox hx: smokes 1 cig every 2 weeks, used to do 0.5 ppd from age 73 years (did not clarify how long she did that). Uses Marijuana occasionally.  No etoh use. No cocaine use.  fam hx: mother - breast cancer. Sister has clot on her leg (unclear whether DVT or PAD).    Meds:  Prescriptions Prior to Admission  Medication Sig Dispense Refill Last Dose  . amitriptyline (ELAVIL) 25 MG tablet Take 25 mg by mouth at bedtime. Reported on 07/19/2015   Past Week at Unknown time  . amLODipine (NORVASC) 5 MG tablet Take 5 mg by mouth daily. Reported on 07/19/2015   01/07/2016 at Unknown time    . atorvastatin (LIPITOR) 20 MG tablet Take 20 mg by mouth daily. Reported on 12/11/2015  0 01/07/2016 at Unknown time  . DUREZOL 0.05 % EMUL Place 2 drops into the left eye daily.  1 01/07/2016 at Unknown time  . EDARBYCLOR 40-25 MG TABS Take 1 tablet by mouth daily. Reported on 12/11/2015  5 01/07/2016 at Unknown time  . furosemide (LASIX) 20 MG tablet Take 20 mg by mouth daily.  3 01/07/2016 at Unknown time  . gabapentin (NEURONTIN) 600 MG tablet Take 600 mg by mouth 2 (two) times daily.  5 01/07/2016 at Unknown time  . hydrochlorothiazide (HYDRODIURIL) 25 MG tablet Take 1 tablet (25 mg total) by mouth daily. 30 tablet 1 01/07/2016 at Unknown time  . levothyroxine (SYNTHROID, LEVOTHROID) 125 MCG tablet Take 125 mcg by mouth daily before breakfast.    01/07/2016 at Unknown time  . Liraglutide (VICTOZA) 18 MG/3ML SOPN Inject 0.6 mg into the skin daily.   01/07/2016 at Unknown time  . lisinopril (PRINIVIL,ZESTRIL) 10 MG tablet Take 1 tablet (10 mg total) by mouth daily. 30 tablet 1 01/07/2016 at Unknown time  . metFORMIN (GLUCOPHAGE) 500 MG tablet Take 500 mg by mouth daily with  breakfast. Reported on 12/11/2015   01/07/2016 at Unknown time  . oxyCODONE-acetaminophen (PERCOCET) 7.5-325 MG tablet Take 1 tablet by mouth 3 (three) times daily as needed for moderate pain.   0 Unknown at Unknown     Allergies: Allergies as of 01/08/2016 - Review Complete 01/08/2016  Allergen Reaction Noted  . Bee venom Shortness Of Breath 04/11/2012   Past Medical History:  Diagnosis Date  . Cancer of thyroid ([place])   . Diabetes mellitus without complication ([place])   . Hypertension   . Thyroid disease   . Vitreous floaters of left eye      Review of Systems: Review of Systems  Constitutional: Negative for chills and fever.  Gastrointestinal: Negative for diarrhea, nausea and vomiting.  Genitourinary: Negative for dysuria.  Neurological: Positive for tingling and sensory change. Negative for dizziness, focal weakness and seizures.   Psychiatric/Behavioral: Positive for substance abuse. Negative for depression and suicidal ideas.    Physical Exam: Blood pressure 110/78, pulse 83, temperature 97.7 F (36.5 C), temperature source Oral, resp. rate 18, height 5\' 3"  (1.6 m), weight 169 lb 15.6 oz (77.1 kg), last menstrual period 03/21/2014, SpO2 100 %.  Physical Exam  Constitutional: She is oriented to person, place, and time. She appears well-developed and well-nourished. No distress.  HENT:  Head: Normocephalic and atraumatic.  Eyes: Conjunctivae are normal. Right eye exhibits no discharge. Left eye exhibits no discharge.  right artificial eye.  Cardiovascular: Normal rate and regular rhythm.  Exam reveals no gallop and no friction rub.   No murmur heard. Respiratory: Effort normal and breath sounds normal. No respiratory distress. She has no wheezes.  GI: Soft. Bowel sounds are normal. She exhibits no distension. There is no tenderness.  Musculoskeletal: Normal range of motion.  Delayed cap refill on left foot. Absent PD and DP pulses on left. 1+ pulses PD and DP on right.  Mild tenderness to palpation over her left lower leg.    Neurological: She is alert and oriented to person, place, and time. No cranial nerve deficit.  Skin: Skin is warm. No rash noted. She is not diaphoretic.  Psychiatric: She has a normal mood and affect.    EKG: none this admission. Ordered EKG.   CXR: none this admission.  CTA abd/pelvis and lower exts:   progressive arterial occlusive disease through LEFT iliac artery system. Also progressive long segment arterial occlusive dz in Right SFA and prox popliteal artery.   CBC Latest Ref Rng & Units 01/08/2016 01/08/2016 12/11/2015  WBC 4.0 - 10.5 K/uL - 7.1 -  Hemoglobin 12.0 - 15.0 g/dL 12.6 11.9(L) 12.2  Hematocrit 36.0 - 46.0 % 37.0 37.0 36.0  Platelets 150 - 400 K/uL - 323 -   BMP Latest Ref Rng & Units 01/08/2016 01/08/2016 12/11/2015  Glucose 65 - 99 mg/dL 102(H) 106(H) -  BUN 6 - 20  mg/dL 24(H) 22(H) -  Creatinine 0.44 - 1.00 mg/dL 1.00 1.06(H) -  Sodium 135 - 145 mmol/L 139 137 -  Potassium 3.5 - 5.1 mmol/L 3.6 3.7 3.0(L)  Chloride 101 - 111 mmol/L 101 102 -  CO2 22 - 32 mmol/L - 26 -  Calcium 8.9 - 10.3 mg/dL - 9.4 -    Assessment & Plan by Problem: Active Problems:   Iliac artery occlusion (HCC)   Diabetes (HCC)   HTN (hypertension)   Hypothyroidism   PVD (peripheral vascular disease) ([place])  Acute Threatened Left limb ischemia -from left iliac artery occlusion This is likely from systemic arterial  disease as evidenced by plaques throughout her aorta 2/2 to her DM II, HTN, and smoking. Less likely to be from embolic source without hx of Afib, no recent trauma to the leg or vessel.  - vascular surgery already consulted. Plan is to take her to OR tomorrow. Requested cardiac clearance.  - cardiology consulted. Planning to do LHC in AM as the first case.  - appreciate card and vascular surgery recs.  - cont heparin gtt - cont percocet for leg pain.  - counseled on smoking cessation.  - increased lipitor to 40mg   (was not taking any at home).  - will benefit from aspirin, started.   Intermittent chest pain  likely has CAD given arterial plaques seen on CTA. Has risk factors of CAD including HTN, DM II, PVD. No current chest pain to suggest ACS. EKG unremarkable. No previous hx of MI, no previous stress test or cath.  - cards planning to [facility] tomorrow to evaluate her vasculature.  - started asa  DM II - no a1c in chart At home takes metformin and Victoza - will do SSI here. Hold metformin. Check hgba1c.  - has gabapentin and amitriptyline from home, likely there was a thought that she has neuropathic pain chronically, which does not seem like the case per hx. However, will continue for now as she has been taking it chronically to avoid any withdrawals.   HTN - has many meds in the home med list which she may not be taking Home meds listed include: amlodipine  5mg  + lasix 20mg  + hctz 25mg  + lisinopril 10 + Edarbyclor 40-25mg .  - hold off on BP meds for now. Will adjust/add back as needed.   Polysubstance abuse - smokes cigarettes and marijuana Denies cocaine use currently. - UDS - nicotine patch.  Hypothyroidism - last TSH 91 on 06/2012 - cont synthyroid 129mcg for now, adjust based on repeat TSH.   Dispo: Admit patient to Inpatient with expected length of stay greater than 2 midnights.  Signed: Dellia Nims, MD 01/08/2016, 6:25 PM  Pager: [phone]

## 2016-01-09 ENCOUNTER — Inpatient Hospital Stay (HOSPITAL_COMMUNITY): Payer: Medicare Other | Admitting: Certified Registered Nurse Anesthetist

## 2016-01-09 ENCOUNTER — Encounter (HOSPITAL_COMMUNITY)
Admission: EM | Disposition: A | Discharge status: Home / Self Care | Payer: Self-pay | Source: Home / Self Care | Attending: Student in an Organized Health Care Education/Training Program

## 2016-01-09 ENCOUNTER — Encounter (HOSPITAL_COMMUNITY): Payer: Self-pay | Admitting: Interventional Cardiology

## 2016-01-09 DIAGNOSIS — Z7984 Long term (current) use of oral hypoglycemic drugs: Secondary | ICD-10-CM

## 2016-01-09 DIAGNOSIS — F17200 Nicotine dependence, unspecified, uncomplicated: Secondary | ICD-10-CM

## 2016-01-09 DIAGNOSIS — E039 Hypothyroidism, unspecified: Secondary | ICD-10-CM

## 2016-01-09 DIAGNOSIS — F1210 Cannabis abuse, uncomplicated: Secondary | ICD-10-CM

## 2016-01-09 DIAGNOSIS — F1410 Cocaine abuse, uncomplicated: Secondary | ICD-10-CM

## 2016-01-09 DIAGNOSIS — I2510 Atherosclerotic heart disease of native coronary artery without angina pectoris: Secondary | ICD-10-CM

## 2016-01-09 DIAGNOSIS — E119 Type 2 diabetes mellitus without complications: Secondary | ICD-10-CM

## 2016-01-09 DIAGNOSIS — R079 Chest pain, unspecified: Secondary | ICD-10-CM

## 2016-01-09 DIAGNOSIS — Z955 Presence of coronary angioplasty implant and graft: Secondary | ICD-10-CM

## 2016-01-09 HISTORY — PX: CARDIAC CATHETERIZATION: SHX172

## 2016-01-09 HISTORY — PX: INSERTION OF ILIAC STENT: SHX6256

## 2016-01-09 LAB — CBC
HCT: 34.3 % — ABNORMAL LOW (ref 36.0–46.0)
Hemoglobin: 10.8 g/dL — ABNORMAL LOW (ref 12.0–15.0)
MCH: 29.3 pg (ref 26.0–34.0)
MCHC: 31.5 g/dL (ref 30.0–36.0)
MCV: 93 fL (ref 78.0–100.0)
PLATELETS: 307 10*3/uL (ref 150–400)
RBC: 3.69 MIL/uL — AB (ref 3.87–5.11)
RDW: 15.1 % (ref 11.5–15.5)
WBC: 5.9 10*3/uL (ref 4.0–10.5)

## 2016-01-09 LAB — GLUCOSE, CAPILLARY
GLUCOSE-CAPILLARY: 110 mg/dL — AB (ref 65–99)
GLUCOSE-CAPILLARY: 96 mg/dL (ref 65–99)
Glucose-Capillary: 92 mg/dL (ref 65–99)
Glucose-Capillary: 105 mg/dL — ABNORMAL HIGH (ref 65–99)
Glucose-Capillary: 87 mg/dL (ref 65–99)

## 2016-01-09 LAB — BASIC METABOLIC PANEL
ANION GAP: 8 (ref 5–15)
BUN: 15 mg/dL (ref 6–20)
CALCIUM: 8.8 mg/dL — AB (ref 8.9–10.3)
CO2: 26 mmol/L (ref 22–32)
CREATININE: 0.88 mg/dL (ref 0.44–1.00)
Chloride: 104 mmol/L (ref 101–111)
Glucose, Bld: 118 mg/dL — ABNORMAL HIGH (ref 65–99)
Potassium: 3.6 mmol/L (ref 3.5–5.1)
Sodium: 138 mmol/L (ref 135–145)

## 2016-01-09 LAB — LIPID PANEL
CHOLESTEROL: 175 mg/dL (ref 0–200)
HDL: 47 mg/dL (ref >40)
LDL Cholesterol: 85 mg/dL (ref 0–99)
TRIGLYCERIDES: 216 mg/dL — AB (ref <150)
Total CHOL/HDL Ratio: 3.7 RATIO
VLDL: 43 mg/dL — ABNORMAL HIGH (ref 0–40)

## 2016-01-09 LAB — TSH: TSH: 3.065 u[IU]/mL (ref 0.350–4.500)

## 2016-01-09 LAB — SURGICAL PCR SCREEN
MRSA, PCR: NEGATIVE
Staphylococcus aureus: POSITIVE — AB

## 2016-01-09 LAB — HIV ANTIBODY (ROUTINE TESTING W REFLEX): HIV SCREEN 4TH GENERATION: NONREACTIVE

## 2016-01-09 LAB — MRSA PCR SCREENING: MRSA BY PCR: NEGATIVE

## 2016-01-09 LAB — PROTIME-INR
INR: 0.93
Prothrombin Time: 12.5 seconds (ref 11.4–15.2)

## 2016-01-09 LAB — HEPARIN LEVEL (UNFRACTIONATED): HEPARIN UNFRACTIONATED: 0.37 [IU]/mL (ref 0.30–0.70)

## 2016-01-09 SURGERY — INSERTION, STENT, ARTERY, ILIAC
Anesthesia: General | Laterality: Left

## 2016-01-09 SURGERY — LEFT HEART CATH AND CORONARY ANGIOGRAPHY
Anesthesia: LOCAL

## 2016-01-09 MED ORDER — LIDOCAINE HCL (PF) 1 % IJ SOLN
INTRAMUSCULAR | Status: AC
  Filled 2016-01-09: qty 30

## 2016-01-09 MED ORDER — GLYCOPYRROLATE 0.2 MG/ML IV SOSY
PREFILLED_SYRINGE | INTRAVENOUS | Status: AC
  Filled 2016-01-09: qty 3

## 2016-01-09 MED ORDER — PROMETHAZINE HCL 25 MG/ML IJ SOLN: 6.2500 mg | INTRAMUSCULAR | Status: DC | PRN

## 2016-01-09 MED ORDER — ACETAMINOPHEN 325 MG PO TABS: 650.0000 mg | ORAL_TABLET | ORAL | Status: DC | PRN

## 2016-01-09 MED ORDER — ONDANSETRON HCL 4 MG/2ML IJ SOLN
INTRAMUSCULAR | Status: AC
  Filled 2016-01-09: qty 2

## 2016-01-09 MED ORDER — LABETALOL HCL 5 MG/ML IV SOLN: 10.0000 mg | INTRAVENOUS | Status: DC | PRN

## 2016-01-09 MED ORDER — IODIXANOL 320 MG/ML IV SOLN: INTRAVENOUS | Status: DC | PRN

## 2016-01-09 MED ORDER — FENTANYL CITRATE (PF) 100 MCG/2ML IJ SOLN
INTRAMUSCULAR | Status: DC | PRN
  Administered 2016-01-09: 50 ug via INTRAVENOUS

## 2016-01-09 MED ORDER — HEPARIN SODIUM (PORCINE) 1000 UNIT/ML IJ SOLN
INTRAMUSCULAR | Status: AC
  Filled 2016-01-09: qty 1

## 2016-01-09 MED ORDER — SUFENTANIL CITRATE 50 MCG/ML IV SOLN
INTRAVENOUS | Status: AC
  Filled 2016-01-09: qty 1

## 2016-01-09 MED ORDER — PANTOPRAZOLE SODIUM 40 MG PO TBEC
40.0000 mg | DELAYED_RELEASE_TABLET | Freq: Every day | ORAL | Status: DC
  Administered 2016-01-10 – 2016-01-11 (×2): 40 mg via ORAL
  Filled 2016-01-09 (×2): qty 1

## 2016-01-09 MED ORDER — PHENYLEPHRINE HCL 10 MG/ML IJ SOLN
INTRAMUSCULAR | Status: DC | PRN
  Administered 2016-01-09 (×5): 80 ug via INTRAVENOUS

## 2016-01-09 MED ORDER — SODIUM CHLORIDE 0.9 % WEIGHT BASED INFUSION: 1.0000 mL/kg/h | INTRAVENOUS | Status: AC

## 2016-01-09 MED ORDER — PHENYLEPHRINE HCL 10 MG/ML IJ SOLN
INTRAMUSCULAR | Status: AC
  Filled 2016-01-09: qty 1

## 2016-01-09 MED ORDER — METOPROLOL TARTRATE 5 MG/5ML IV SOLN: 2.0000 mg | INTRAVENOUS | Status: DC | PRN

## 2016-01-09 MED ORDER — LIDOCAINE HCL (CARDIAC) 20 MG/ML IV SOLN
INTRAVENOUS | Status: DC | PRN
  Administered 2016-01-09: 80 mg via INTRAVENOUS

## 2016-01-09 MED ORDER — HYDROMORPHONE HCL 1 MG/ML IJ SOLN
0.2500 mg | INTRAMUSCULAR | Status: DC | PRN
  Administered 2016-01-09 (×2): 0.5 mg via INTRAVENOUS
  Administered 2016-01-09: 0.25 mg via INTRAVENOUS
  Administered 2016-01-09: 0.5 mg via INTRAVENOUS

## 2016-01-09 MED ORDER — LACTATED RINGERS IV SOLN
INTRAVENOUS | Status: DC
  Administered 2016-01-09: 16:00:00 via INTRAVENOUS

## 2016-01-09 MED ORDER — FENTANYL CITRATE (PF) 100 MCG/2ML IJ SOLN
INTRAMUSCULAR | Status: AC
  Filled 2016-01-09: qty 2

## 2016-01-09 MED ORDER — CLOPIDOGREL BISULFATE 75 MG PO TABS
75.0000 mg | ORAL_TABLET | Freq: Every day | ORAL | Status: DC
  Administered 2016-01-10 – 2016-01-11 (×3): 75 mg via ORAL
  Filled 2016-01-09 (×3): qty 1

## 2016-01-09 MED ORDER — PHENYLEPHRINE HCL 10 MG/ML IJ SOLN
INTRAVENOUS | Status: DC | PRN
  Administered 2016-01-09: 20 ug/min via INTRAVENOUS

## 2016-01-09 MED ORDER — ALUM & MAG HYDROXIDE-SIMETH 200-200-20 MG/5ML PO SUSP: 15.0000 mL | ORAL | Status: DC | PRN

## 2016-01-09 MED ORDER — NICOTINE POLACRILEX 2 MG MT GUM
2.0000 mg | CHEWING_GUM | OROMUCOSAL | Status: DC | PRN
  Administered 2016-01-10: 2 mg via ORAL
  Filled 2016-01-09 (×3): qty 1

## 2016-01-09 MED ORDER — ENOXAPARIN SODIUM 30 MG/0.3ML ~~LOC~~ SOLN
30.0000 mg | SUBCUTANEOUS | Status: DC
  Administered 2016-01-10: 30 mg via SUBCUTANEOUS
  Filled 2016-01-09: qty 0.3

## 2016-01-09 MED ORDER — CEFAZOLIN SODIUM-DEXTROSE 2-4 GM/100ML-% IV SOLN
2.0000 g | INTRAVENOUS | Status: AC
  Administered 2016-01-09: 2 g via INTRAVENOUS

## 2016-01-09 MED ORDER — MIDAZOLAM HCL 2 MG/2ML IJ SOLN
INTRAMUSCULAR | Status: AC
  Filled 2016-01-09: qty 2

## 2016-01-09 MED ORDER — ASPIRIN EC 81 MG PO TBEC
81.0000 mg | DELAYED_RELEASE_TABLET | Freq: Every day | ORAL | Status: DC
  Administered 2016-01-10: 81 mg via ORAL
  Filled 2016-01-09 (×2): qty 1

## 2016-01-09 MED ORDER — HEPARIN (PORCINE) IN NACL 2-0.9 UNIT/ML-% IJ SOLN
INTRAMUSCULAR | Status: AC
  Filled 2016-01-09: qty 1000

## 2016-01-09 MED ORDER — NEOSTIGMINE METHYLSULFATE 10 MG/10ML IV SOLN
INTRAVENOUS | Status: DC | PRN
  Administered 2016-01-09: 3 mg via INTRAVENOUS

## 2016-01-09 MED ORDER — OXYCODONE HCL 5 MG PO TABS
5.0000 mg | ORAL_TABLET | Freq: Four times a day (QID) | ORAL | Status: DC | PRN
  Administered 2016-01-10 – 2016-01-11 (×3): 5 mg via ORAL
  Filled 2016-01-09 (×3): qty 1

## 2016-01-09 MED ORDER — MIDAZOLAM HCL 2 MG/2ML IJ SOLN
INTRAMUSCULAR | Status: DC | PRN
  Administered 2016-01-09: 1 mg via INTRAVENOUS

## 2016-01-09 MED ORDER — CEFAZOLIN SODIUM 1 G IJ SOLR
INTRAMUSCULAR | Status: AC
  Filled 2016-01-09: qty 20

## 2016-01-09 MED ORDER — VERAPAMIL HCL 2.5 MG/ML IV SOLN
INTRAVENOUS | Status: DC | PRN
  Administered 2016-01-09: 10 mL via INTRA_ARTERIAL

## 2016-01-09 MED ORDER — 0.9 % SODIUM CHLORIDE (POUR BTL) OPTIME
TOPICAL | Status: DC | PRN
  Administered 2016-01-09: 1000 mL

## 2016-01-09 MED ORDER — FENTANYL CITRATE (PF) 250 MCG/5ML IJ SOLN
INTRAMUSCULAR | Status: AC
  Filled 2016-01-09: qty 5

## 2016-01-09 MED ORDER — PHENOL 1.4 % MT LIQD
1.0000 | OROMUCOSAL | Status: DC | PRN
  Administered 2016-01-10: 1 via OROMUCOSAL
  Filled 2016-01-09: qty 177

## 2016-01-09 MED ORDER — POTASSIUM CHLORIDE CRYS ER 20 MEQ PO TBCR: 20.0000 meq | EXTENDED_RELEASE_TABLET | Freq: Every day | ORAL | Status: DC | PRN

## 2016-01-09 MED ORDER — SODIUM CHLORIDE 0.9 % IV BOLUS (SEPSIS): 500.0000 mL | Freq: Once | INTRAVENOUS | Status: DC

## 2016-01-09 MED ORDER — VERAPAMIL HCL 2.5 MG/ML IV SOLN
INTRAVENOUS | Status: AC
  Filled 2016-01-09: qty 2

## 2016-01-09 MED ORDER — HEPARIN SODIUM (PORCINE) 1000 UNIT/ML IJ SOLN
INTRAMUSCULAR | Status: DC | PRN
  Administered 2016-01-09: 9000 [IU] via INTRAVENOUS

## 2016-01-09 MED ORDER — SODIUM CHLORIDE 0.9 % IV SOLN: INTRAVENOUS | Status: DC

## 2016-01-09 MED ORDER — PROTAMINE SULFATE 10 MG/ML IV SOLN
INTRAVENOUS | Status: DC | PRN
  Administered 2016-01-09: 15 mg via INTRAVENOUS
  Administered 2016-01-09: 20 mg via INTRAVENOUS
  Administered 2016-01-09: 15 mg via INTRAVENOUS

## 2016-01-09 MED ORDER — HYDROMORPHONE HCL 1 MG/ML IJ SOLN
INTRAMUSCULAR | Status: AC
  Filled 2016-01-09: qty 1

## 2016-01-09 MED ORDER — MAGNESIUM SULFATE 2 GM/50ML IV SOLN: 2.0000 g | Freq: Every day | INTRAVENOUS | Status: DC | PRN

## 2016-01-09 MED ORDER — IOPAMIDOL (ISOVUE-370) INJECTION 76%
INTRAVENOUS | Status: DC | PRN
  Administered 2016-01-09: 50 mL via INTRAVENOUS

## 2016-01-09 MED ORDER — PHENYLEPHRINE 40 MCG/ML (10ML) SYRINGE FOR IV PUSH (FOR BLOOD PRESSURE SUPPORT)
PREFILLED_SYRINGE | INTRAVENOUS | Status: AC
  Filled 2016-01-09: qty 10

## 2016-01-09 MED ORDER — ONDANSETRON HCL 4 MG/2ML IJ SOLN
INTRAMUSCULAR | Status: DC | PRN
  Administered 2016-01-09: 4 mg via INTRAVENOUS

## 2016-01-09 MED ORDER — MIDAZOLAM HCL 5 MG/5ML IJ SOLN
INTRAMUSCULAR | Status: DC | PRN
  Administered 2016-01-09: 2 mg via INTRAVENOUS

## 2016-01-09 MED ORDER — SODIUM CHLORIDE 0.9 % IV SOLN: 500.0000 mL | Freq: Once | INTRAVENOUS | Status: DC | PRN

## 2016-01-09 MED ORDER — GLYCOPYRROLATE 0.2 MG/ML IJ SOLN
INTRAMUSCULAR | Status: DC | PRN
  Administered 2016-01-09: 0.4 mg via INTRAVENOUS

## 2016-01-09 MED ORDER — IODIXANOL 320 MG/ML IV SOLN
INTRAVENOUS | Status: DC | PRN
  Administered 2016-01-09: 150 mL via INTRAVENOUS

## 2016-01-09 MED ORDER — NEOSTIGMINE METHYLSULFATE 5 MG/5ML IV SOSY
PREFILLED_SYRINGE | INTRAVENOUS | Status: AC
  Filled 2016-01-09: qty 5

## 2016-01-09 MED ORDER — MUPIROCIN 2 % EX OINT
1.0000 "application " | TOPICAL_OINTMENT | Freq: Once | CUTANEOUS | Status: AC
  Administered 2016-01-09: 1 via TOPICAL

## 2016-01-09 MED ORDER — FENTANYL CITRATE (PF) 100 MCG/2ML IJ SOLN
INTRAMUSCULAR | Status: DC | PRN
  Administered 2016-01-09 (×3): 50 ug via INTRAVENOUS
  Administered 2016-01-09: 100 ug via INTRAVENOUS

## 2016-01-09 MED ORDER — DOCUSATE SODIUM 100 MG PO CAPS
100.0000 mg | ORAL_CAPSULE | Freq: Every day | ORAL | Status: DC
  Administered 2016-01-10 – 2016-01-11 (×2): 100 mg via ORAL
  Filled 2016-01-09 (×2): qty 1

## 2016-01-09 MED ORDER — LIDOCAINE HCL (PF) 1 % IJ SOLN
INTRAMUSCULAR | Status: DC | PRN
  Administered 2016-01-09: 1 mL

## 2016-01-09 MED ORDER — GUAIFENESIN-DM 100-10 MG/5ML PO SYRP: 15.0000 mL | ORAL_SOLUTION | ORAL | Status: DC | PRN

## 2016-01-09 MED ORDER — MUPIROCIN 2 % EX OINT
TOPICAL_OINTMENT | CUTANEOUS | Status: AC
  Filled 2016-01-09: qty 22

## 2016-01-09 MED ORDER — ROCURONIUM BROMIDE 50 MG/5ML IV SOLN
INTRAVENOUS | Status: AC
  Filled 2016-01-09: qty 1

## 2016-01-09 MED ORDER — SODIUM CHLORIDE 0.9 % IV SOLN: 250.0000 mL | INTRAVENOUS | Status: DC | PRN

## 2016-01-09 MED ORDER — ROCURONIUM BROMIDE 100 MG/10ML IV SOLN
INTRAVENOUS | Status: DC | PRN
  Administered 2016-01-09: 40 mg via INTRAVENOUS

## 2016-01-09 MED ORDER — MORPHINE SULFATE (PF) 2 MG/ML IV SOLN
2.0000 mg | INTRAVENOUS | Status: DC | PRN
  Administered 2016-01-10: 4 mg via INTRAVENOUS
  Administered 2016-01-11: 2 mg via INTRAVENOUS
  Filled 2016-01-09 (×2): qty 2

## 2016-01-09 MED ORDER — HEPARIN SODIUM (PORCINE) 1000 UNIT/ML IJ SOLN
INTRAMUSCULAR | Status: DC | PRN
  Administered 2016-01-09: 3500 [IU] via INTRAVENOUS

## 2016-01-09 MED ORDER — HYDRALAZINE HCL 20 MG/ML IJ SOLN: 5.0000 mg | INTRAMUSCULAR | Status: DC | PRN

## 2016-01-09 MED ORDER — PROPOFOL 10 MG/ML IV BOLUS
INTRAVENOUS | Status: DC | PRN
  Administered 2016-01-09: 150 mg via INTRAVENOUS

## 2016-01-09 MED ORDER — SUFENTANIL CITRATE 50 MCG/ML IV SOLN
INTRAVENOUS | Status: DC | PRN
  Administered 2016-01-09: 5 ug via INTRAVENOUS

## 2016-01-09 MED ORDER — SODIUM CHLORIDE 0.9 % IV SOLN
INTRAVENOUS | Status: DC | PRN
  Administered 2016-01-09: 17:00:00

## 2016-01-09 MED ORDER — IOPAMIDOL (ISOVUE-370) INJECTION 76%
INTRAVENOUS | Status: AC
  Filled 2016-01-09: qty 100

## 2016-01-09 MED ORDER — HEPARIN (PORCINE) IN NACL 100-0.45 UNIT/ML-% IJ SOLN
1100.0000 [IU]/h | INTRAMUSCULAR | Status: DC
  Administered 2016-01-09: 1100 [IU]/h via INTRAVENOUS
  Filled 2016-01-09: qty 250

## 2016-01-09 MED ORDER — HEPARIN (PORCINE) IN NACL 2-0.9 UNIT/ML-% IJ SOLN
INTRAMUSCULAR | Status: DC | PRN
  Administered 2016-01-09: 500 mL
  Administered 2016-01-09: 1000 mL

## 2016-01-09 MED ORDER — LIDOCAINE 2% (20 MG/ML) 5 ML SYRINGE
INTRAMUSCULAR | Status: AC
  Filled 2016-01-09: qty 5

## 2016-01-09 MED ORDER — ONDANSETRON HCL 4 MG/2ML IJ SOLN: 4.0000 mg | Freq: Four times a day (QID) | INTRAMUSCULAR | Status: DC | PRN

## 2016-01-09 MED ORDER — DEXTROSE 5 % IV SOLN
1.5000 g | Freq: Two times a day (BID) | INTRAVENOUS | Status: AC
  Administered 2016-01-10 (×2): 1.5 g via INTRAVENOUS
  Filled 2016-01-09 (×2): qty 1.5

## 2016-01-09 MED ORDER — LACTATED RINGERS IV SOLN
INTRAVENOUS | Status: DC | PRN
  Administered 2016-01-09 (×2): via INTRAVENOUS

## 2016-01-09 MED ORDER — PROPOFOL 10 MG/ML IV BOLUS
INTRAVENOUS | Status: AC
  Filled 2016-01-09: qty 20

## 2016-01-09 MED ORDER — SODIUM CHLORIDE 0.9% FLUSH
3.0000 mL | Freq: Two times a day (BID) | INTRAVENOUS | Status: DC
  Administered 2016-01-10 – 2016-01-11 (×4): 3 mL via INTRAVENOUS

## 2016-01-09 MED ORDER — SODIUM CHLORIDE 0.9% FLUSH: 3.0000 mL | INTRAVENOUS | Status: DC | PRN

## 2016-01-09 SURGICAL SUPPLY — 60 items
BAG BANDED W/RUBBER/TAPE 36X54 (MISCELLANEOUS) ×3 IMPLANT
BAG SNAP BAND KOVER 36X36 (MISCELLANEOUS) ×3 IMPLANT
CANISTER SUCTION 2500CC (MISCELLANEOUS) ×3 IMPLANT
CATH ANGIO 5F BER2 65CM (CATHETERS) ×3 IMPLANT
CATH EMB 4FR 80CM (CATHETERS) ×3 IMPLANT
CATH OMNI FLUSH .035X70CM (CATHETERS) ×3 IMPLANT
CLIP TI MEDIUM 24 (CLIP) ×3 IMPLANT
CLIP TI MEDIUM 6 (CLIP) IMPLANT
CLIP TI WIDE RED SMALL 24 (CLIP) ×3 IMPLANT
CLIP TI WIDE RED SMALL 6 (CLIP) IMPLANT
COVER PROBE W GEL 5X96 (DRAPES) ×3 IMPLANT
DEVICE INFLATION ENCORE 26 (MISCELLANEOUS) ×3 IMPLANT
DEVICE TORQUE H2O (MISCELLANEOUS) ×3 IMPLANT
DRAIN CHANNEL 15F RND FF W/TCR (WOUND CARE) IMPLANT
DRAPE C-ARM 42X72 X-RAY (DRAPES) IMPLANT
ELECT REM PT RETURN 9FT ADLT (ELECTROSURGICAL) ×3
ELECTRODE REM PT RTRN 9FT ADLT (ELECTROSURGICAL) ×1 IMPLANT
EVACUATOR SILICONE 100CC (DRAIN) IMPLANT
GLOVE BIOGEL PI IND STRL 7.5 (GLOVE) ×1 IMPLANT
GLOVE BIOGEL PI INDICATOR 7.5 (GLOVE) ×2
GLOVE SURG SS PI 7.5 STRL IVOR (GLOVE) ×3 IMPLANT
GOWN STRL REUS W/ TWL LRG LVL3 (GOWN DISPOSABLE) ×2 IMPLANT
GOWN STRL REUS W/ TWL XL LVL3 (GOWN DISPOSABLE) ×1 IMPLANT
GOWN STRL REUS W/TWL LRG LVL3 (GOWN DISPOSABLE) ×4
GOWN STRL REUS W/TWL XL LVL3 (GOWN DISPOSABLE) ×2
GUIDEWIRE ANGLED .035X150CM (WIRE) ×3 IMPLANT
HEMOSTAT SNOW SURGICEL 2X4 (HEMOSTASIS) IMPLANT
KIT BASIN OR (CUSTOM PROCEDURE TRAY) ×3 IMPLANT
KIT ENCORE 26 ADVANTAGE (KITS) IMPLANT
KIT ROOM TURNOVER OR (KITS) ×3 IMPLANT
LIQUID BAND (GAUZE/BANDAGES/DRESSINGS) ×3 IMPLANT
NEEDLE PERC 18GX7CM (NEEDLE) IMPLANT
NS IRRIG 1000ML POUR BTL (IV SOLUTION) ×6 IMPLANT
PACK PERIPHERAL VASCULAR (CUSTOM PROCEDURE TRAY) ×3 IMPLANT
PAD ARMBOARD 7.5X6 YLW CONV (MISCELLANEOUS) ×6 IMPLANT
PROTECTION STATION PRESSURIZED (MISCELLANEOUS) ×3
SET MICROPUNCTURE 5F STIFF (MISCELLANEOUS) ×3 IMPLANT
SHEATH AVANTI 11CM 5FR (MISCELLANEOUS) ×3 IMPLANT
SHEATH INTROD 8 FR 25CM (MISCELLANEOUS) ×3 IMPLANT
STATION PROTECTION PRESSURIZED (MISCELLANEOUS) ×1 IMPLANT
STENT VIABAHN VBX 9X59X135 (Permanent Stent) ×3 IMPLANT
STOPCOCK 4 WAY LG BORE MALE ST (IV SETS) ×3 IMPLANT
STOPCOCK MORSE 400PSI 3WAY (MISCELLANEOUS) IMPLANT
SUT PROLENE 5 0 C 1 24 (SUTURE) IMPLANT
SUT PROLENE 6 0 BV (SUTURE) ×6 IMPLANT
SUT PROLENE 6 0 CC (SUTURE) IMPLANT
SUT VIC AB 2-0 CT1 27 (SUTURE) ×2
SUT VIC AB 2-0 CT1 TAPERPNT 27 (SUTURE) ×1 IMPLANT
SUT VIC AB 2-0 CTX 36 (SUTURE) IMPLANT
SUT VIC AB 3-0 SH 27 (SUTURE) ×2
SUT VIC AB 3-0 SH 27X BRD (SUTURE) ×1 IMPLANT
SUT VICRYL 4-0 PS2 18IN ABS (SUTURE) ×3 IMPLANT
SYR 3ML LL SCALE MARK (SYRINGE) ×3 IMPLANT
SYR MEDRAD MARK V 150ML (SYRINGE) IMPLANT
SYRINGE 10CC LL (SYRINGE) ×6 IMPLANT
TRAY FOLEY W/METER SILVER 16FR (SET/KITS/TRAYS/PACK) IMPLANT
TUBING EXTENTION W/L.L. (IV SETS) IMPLANT
TUBING HIGH PRESSURE 120CM (CONNECTOR) IMPLANT
WATER STERILE IRR 1000ML POUR (IV SOLUTION) ×3 IMPLANT
WIRE HI TORQ VERSACORE J 260CM (WIRE) ×3 IMPLANT

## 2016-01-09 SURGICAL SUPPLY — 9 items
CATH IMPULSE 5F ANG/FL3.5 (CATHETERS) ×2 IMPLANT
DEVICE RAD COMP TR BAND LRG (VASCULAR PRODUCTS) ×2 IMPLANT
GLIDESHEATH SLEND SS 6F .021 (SHEATH) ×2 IMPLANT
KIT HEART LEFT (KITS) ×2 IMPLANT
PACK CARDIAC CATHETERIZATION (CUSTOM PROCEDURE TRAY) ×2 IMPLANT
TRANSDUCER W/STOPCOCK (MISCELLANEOUS) ×2 IMPLANT
TUBING CIL FLEX 10 FLL-RA (TUBING) ×2 IMPLANT
WIRE HI TORQ VERSACORE-J 145CM (WIRE) ×2 IMPLANT
WIRE SAFE-T 1.5MM-J .035X260CM (WIRE) ×2 IMPLANT

## 2016-01-09 NOTE — Progress Notes (Signed)
   Subjective: Pt has no complaints. S/p LHC w/o complication. She is positive that she will stop smoking, [birth year].   Objective:  Vital signs in last 24 hours: Vitals:   01/09/16 0855 01/09/16 0910 01/09/16 0925 01/09/16 1329  BP: 93/67 (!) 110/96 102/76 104/79  Pulse: 84 82 84 (!) 109  Resp: 17 14 13 18   Temp:    98.1 F (36.7 C)  TempSrc:    Oral  SpO2: 98% 99% 98% 95%  Weight:      Height:       Physical Exam  Constitutional: She appears well-developed and well-nourished. No distress.  HENT:  Head: Normocephalic and atraumatic.  Nose: Nose normal.  Cardiovascular: Normal rate, regular rhythm and normal heart sounds.  Exam reveals no gallop and no friction rub.   No murmur heard. Pulmonary/Chest: Breath sounds normal. No respiratory distress. She has no wheezes. She has no rales. She exhibits no tenderness.  Abdominal: Soft. Bowel sounds are normal. She exhibits no distension and no mass. There is no tenderness. There is no rebound and no guarding.  Musculoskeletal: She exhibits no edema.  Skin: Skin is dry. No rash noted. She is not diaphoretic. No erythema. No pallor.  Rt radial cath site dressing dry and non TTP    Assessment/Plan:  Principal Problem:   Iliac artery occlusion (HCC) Active Problems:   Diabetes (HCC)   HTN (hypertension)   Hypothyroidism   PVD (peripheral vascular disease) (HCC)   Intermittent chest pain   HLD (hyperlipidemia)   Exertional angina (HCC)   Preoperative cardiovascular examination  Acute Threatened Left limb ischemia -from left iliac artery occlusion - s/p LHC this morning that revealed mild, non obstructive CAD, continue w/ plan for left iliac stent w/ possibility of femoral femoral bypass if needed.  - appreciate card and vascular surgery recs.  - cont heparin gtt - cont percocet for leg pain. - lipitor to 40mg     DM II - no a1c in chart At home takes metformin and Victoza - will do SSI here. Hold metformin.  hgba1c pending - has gabapentin and amitriptyline from home, likely there was a thought that she has neuropathic pain chronically, which does not seem like the case per hx. However, will continue for now as she has been taking it chronically to avoid any withdrawals.   HTN - has many meds in the home med list which she may not be taking Home meds listed include: amlodipine 5mg  + lasix 20mg  + hctz 25mg  + lisinopril 10 + Edarbyclor 40-25mg .  -normotensive,  hold off on BP meds for now. Will adjust/add back as needed.   Polysubstance abuse - UDS + for THC and cocaine.  - nicotine patch, ordered nicorette gum.  - counseled on tobacco cessation  Hypothyroidism - last TSH wnl this admission - cont synthyroid 163mcg    Dispo: Admit patient to Inpatient with expected length of stay greater than 2 midnights.  LOS: 1 day   Norman Herrlich, MD 01/09/2016, 1:32 PM Pager:[phone]

## 2016-01-09 NOTE — Anesthesia Procedure Notes (Signed)
Procedure Name: Intubation Date/Time: 01/09/2016 5:01 PM Performed by: Garrison Columbus T Pre-anesthesia Checklist: Patient identified, Emergency Drugs available, Suction available and Patient being monitored Patient Re-evaluated:Patient Re-evaluated prior to inductionOxygen Delivery Method: Circle System Utilized Preoxygenation: Pre-oxygenation with 100% oxygen Intubation Type: IV induction Ventilation: Mask ventilation without difficulty Laryngoscope Size: Miller and 2 Grade View: Grade I Tube type: Oral Tube size: 7.5 mm Number of attempts: 1 Airway Equipment and Method: Stylet Placement Confirmation: ETT inserted through vocal cords under direct vision,  positive ETCO2 and breath sounds checked- equal and bilateral Secured at: 22 cm Tube secured with: Tape Dental Injury: Teeth and Oropharynx as per pre-operative assessment

## 2016-01-09 NOTE — Interval H&P Note (Signed)
Cath Lab Visit (complete for each Cath Lab visit)  Clinical Evaluation Leading to the Procedure:   ACS: No.  Non-ACS:    Anginal Classification: CCS III  Anti-ischemic medical therapy: Minimal Therapy (1 class of medications)  Non-Invasive Test Results: No non-invasive testing performed  Prior CABG: No previous CABG      History and Physical Interval Note:  01/09/2016 7:35 AM  Jordan Harvey  has presented today for surgery, with the diagnosis of presurgical clearancd  The various methods of treatment have been discussed with the patient and family. After consideration of risks, benefits and other options for treatment, the patient has consented to  Procedure(s): Left Heart Cath and Coronary Angiography (N/A) as a surgical intervention .  The patient's history has been reviewed, patient examined, no change in status, stable for surgery.  I have reviewed the patient's chart and labs.  Questions were answered to the patient's satisfaction.     Belva Crome III

## 2016-01-09 NOTE — H&P (View-Only) (Signed)
Patient ID: Jordan Harvey MRN: [id number], DOB/AGE: [birth year]   Admit date: 01/08/2016  Reason for Consult: Preoperative assessment Requesting M.D: Dr. Isac Sarna, Internal Medicine/ Dr. Trula Slade, Vascular Surgery.  Primary Physician: Elizabeth Palau, MD (Inactive) Primary Cardiologist: New (Dr. Radford Pax)   Pt. Profile:   50 year old female with history of type 2 diabetes, hypertension, thyroid cancer, FH of CAD (sister had an MI at age 52) and tobacco abuse, who presented to [facility] with complaint of left leg pain. 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 Plan is for thrombectomy 01/09/2016, per vascular surgery. Cardiology consult for preoperative clearance given the patient has had recent chest pain.  Problem List  Past Medical History:  Diagnosis Date  . Cancer of thyroid ([place])   . Diabetes mellitus without complication ([place])   . Hypertension   . Thyroid disease   . Vitreous floaters of left eye     Past Surgical History:  Procedure Laterality Date  . EYE SURGERY    . THYROID SURGERY  2008   Not sure what they did  . TUBAL LIGATION  1991     Allergies  Allergies  Allergen Reactions  . Bee Venom Shortness Of Breath    HPI   50 year old female with history of type 2 diabetes, hypertension, thyroid cancer, FH of CAD (sister had an MI at age 57) and tobacco abuse, who presented to [facility] with complaint of left leg pain. 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 Plan is for thrombectomy 01/09/2016, per vascular surgery. Cardiology consult for preoperative clearance given the patient has had recent chest pain.  She reports chest discomfort has been occurring for "a while now". Substernal pain. It occurres at rest but also worse with exertion. Described as a sharp pain. She also notes associated exertional dyspnea. She is unable to walk up a flight of stairs. She often has to stop due to chest pain and  shortness of breath. There is no documented history of atrial fibrillation however she reports occasional palpitations.  She is currently chest pain-free. EKG shows sinus rhythm with no ischemic abnormalities. CT angio noted moderate atheromatous plaque throughout the length of the aorta without aneurysm, dissection, or or high-grade stenosis.   Home Medications  Prior to Admission medications   Medication Sig Start Date End Date Taking? Authorizing Provider  amitriptyline (ELAVIL) 25 MG tablet Take 25 mg by mouth at bedtime. Reported on 07/19/2015   Yes Historical Provider, MD  amLODipine (NORVASC) 5 MG tablet Take 5 mg by mouth daily. Reported on 07/19/2015   Yes Historical Provider, MD  atorvastatin (LIPITOR) 20 MG tablet Take 20 mg by mouth daily. Reported on 12/11/2015 10/31/15  Yes Historical Provider, MD  DUREZOL 0.05 % EMUL Place 2 drops into the left eye daily. 11/11/15  Yes Historical Provider, MD  EDARBYCLOR 40-25 MG TABS Take 1 tablet by mouth daily. Reported on 12/11/2015 11/18/15  Yes Historical Provider, MD  furosemide (LASIX) 20 MG tablet Take 20 mg by mouth daily. 10/31/15  Yes Historical Provider, MD  gabapentin (NEURONTIN) 600 MG tablet Take 600 mg by mouth 2 (two) times daily. 10/31/15  Yes Historical Provider, MD  hydrochlorothiazide (HYDRODIURIL) 25 MG tablet Take 1 tablet (25 mg total) by mouth daily. 01/24/13  Yes Antonietta Breach, PA-C  levothyroxine (SYNTHROID, LEVOTHROID) 125 MCG tablet Take 125 mcg by mouth daily before breakfast.  07/08/15  Yes Historical Provider, MD  Liraglutide (VICTOZA) 18 MG/3ML SOPN Inject 0.6 mg  into the skin daily.   Yes Historical Provider, MD  lisinopril (PRINIVIL,ZESTRIL) 10 MG tablet Take 1 tablet (10 mg total) by mouth daily. 01/24/13  Yes Antonietta Breach, PA-C  metFORMIN (GLUCOPHAGE) 500 MG tablet Take 500 mg by mouth daily with breakfast. Reported on 12/11/2015   Yes Historical Provider, MD  oxyCODONE-acetaminophen (PERCOCET) 7.5-325 MG tablet Take 1 tablet by  mouth 3 (three) times daily as needed for moderate pain.  11/27/15   Historical Provider, MD    Family History  Family History  Problem Relation Age of Onset  . Breast cancer Mother   . CAD Sister 21    MI    Social History  Social History   Social History  . Marital status: Single    Spouse name: N/A  . Number of children: N/A  . Years of education: N/A   Occupational History  . Not on file.   Social History Main Topics  . Smoking status: Light Tobacco Smoker    Packs/day: 0.01    Types: Cigarettes  . Smokeless tobacco: Never Used  . Alcohol use No  . Drug use:     Frequency: 1.0 time per week    Types: Marijuana  . Sexual activity: Yes    Birth control/ protection: None, Surgical   Other Topics Concern  . Not on file   Social History Narrative  . No narrative on file     Review of Systems General:  No chills, fever, night sweats or weight changes.  Cardiovascular:  No chest pain, dyspnea on exertion, edema, orthopnea, palpitations, paroxysmal nocturnal dyspnea. Dermatological: No rash, lesions/masses Respiratory: No cough, dyspnea Urologic: No hematuria, dysuria Abdominal:   No nausea, vomiting, diarrhea, bright red blood per rectum, melena, or hematemesis Neurologic:  No visual changes, wkns, changes in mental status. All other systems reviewed and are otherwise negative except as noted above.  Physical Exam  Blood pressure 110/78, pulse 83, temperature 97.7 F (36.5 C), temperature source Oral, resp. rate 18, height 5\' 3"  (1.6 m), weight 169 lb 15.6 oz (77.1 kg), last menstrual period 03/21/2014, SpO2 100 %.  General: Pleasant, NAD Psych: Normal affect. Neuro: Alert and oriented X 3. Moves all extremities spontaneously. HEENT: Normal  Neck: Supple without bruits or JVD. Lungs:  Resp regular and unlabored, CTA. Heart: RRR no s3, s4, or murmurs. Abdomen: Soft, non-tender, non-distended, BS + x 4.  Extremities: No clubbing, cyanosis or edema. Radials  2+. Decreased DPs on left.  Labs  Troponin (Point of Care Test) No results for input(s): TROPIPOC in the last 72 hours. No results for input(s): CKTOTAL, CKMB, TROPONINI in the last 72 hours. Lab Results  Component Value Date   WBC 7.1 01/08/2016   HGB 12.6 01/08/2016   HCT 37.0 01/08/2016   MCV 93.4 01/08/2016   PLT 323 01/08/2016     Recent Labs Lab 01/08/16 1059 01/08/16 1108  NA 137 139  K 3.7 3.6  CL 102 101  CO2 26  --   BUN 22* 24*  CREATININE 1.06* 1.00  CALCIUM 9.4  --   GLUCOSE 106* 102*   Lab Results  Component Value Date   CHOL 306 (H) 06/12/2012   HDL 112 06/12/2012   LDLCALC 171 (H) 06/12/2012   TRIG 113 06/12/2012   Lab Results  Component Value Date   DDIMER 0.37 12/05/2015     Radiology/Studies  Ct Angio Ao+bifem W &/or Wo Contrast  Result Date: 01/08/2016 CLINICAL DATA:  Left foot numbness x4 days, left  leg pain x2 months EXAM: CT ANGIOGRAPHY ABDOMEN AND PELVIS CT ANGIOGRAPHY BILATERAL LOWER EXTREMITIES TECHNIQUE: Multidetector CT imaging of the abdomen and pelvis was performed using the standard protocol during bolus administration of intravenous contrast. Axial helical CT of bilateral lower extremities after power injection of intravenous contrast. Coronal and sagittal reconstructions were generated for vascular evaluation. Multiplanar reconstructed images including MIPs were obtained and reviewed to evaluate the vascular anatomy. CONTRAST:  100 mL Isovue 370 IV COMPARISON:  COMPARISON MR 07/19/2015, CT 03/03/2015 FINDINGS: ARTERIAL Aorta: Moderate atheromatous plaque throughout the length of the aorta without aneurysm, dissection, or or high-grade stenosis. Celiac axis: Short-segment stenosis at the level of the median arcuate ligament of the diaphragm, otherwise widely patent. Superior mesenteric: Noncalcified atheromatous plaque just beyond the origin resulting in short segment stenosis of at least mild severity, patent distally with classic distal  branch anatomy. Left renal:           Single, patent Right renal: Circumferential ostial plaque resulting in short segment stenosis of at least mild severity, patent distally. Inferior mesenteric:  Origin stenosis, widely patent distally. Left iliac: Proximal occlusion of the common iliac artery, with a string sign through the length of the external iliac artery. Apparent long segment occlusion of the internal iliac artery. Right iliac: Mild atheromatous irregularity through the common iliac artery. Long segment origin occlusion of the internal iliac artery. Mild plaque through the length of the external iliac artery without high-grade stenosis. Left lower extremity: Eccentric mild plaque in the common femoral artery. Deep femoral branches patent. Plaque through the SFA with tandem areas of at least mild stenosis. Popliteal artery is patent. Anterior tibial and peroneal runoff. Posterior tibial artery occludes at the lower calf level. Right lower extremity: Common femoral artery has some eccentric nonocclusive plaque. Deep femoral branches patent. There is moderate plaque throughout the SFA resulting in long segment moderate stenosis in the mid and distal SFA, with occlusion at the level of the adductor hiatus. There is collateral reconstitution of the popliteal artery above the knee, patent across the knee with peroneal and posterior tibial runoff. The anterior tibial artery occludes mid calf. VENOUS: Dedicated dedicated venous phase imaging not obtained. Review of the MIP images confirms the above findings. NONVASCULAR Lower chest: Mild dependent atelectasis posteriorly in both lower lobes. No pleural or pericardial effusion. Hepatobiliary: No masses or other significant abnormality. Pancreas: No mass, inflammatory changes, or other significant abnormality. Spleen: Within normal limits in size and appearance. Adrenals/Urinary Tract: 4.2 cm right adrenal adenoma, as previously characterized. Left adrenal  unremarkable. Kidneys unremarkable. No hydronephrosis or mass. No urolithiasis. Urinary bladder physiologically distended. Stomach/Bowel: No evidence of obstruction, inflammatory process, or abnormal fluid collections. Normal appendix. Lymphatic: No pathologically enlarged lymph nodes. Reproductive: NewNo mass or other significant abnormality. Other: No ascites.  No free air. Musculoskeletal: Mild degenerative disc disease in the visualized lower thoracic spine. Mild spondylitic changes in the lower lumbar spine. Negative for fracture. IMPRESSION: 1. 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 2. Two vessel LEFT lower extremity tibial runoff. 3. Progressive long segment arterial occlusive disease in the RIGHT SFA and proximal popliteal artery, reconstituted distally with 2 vessel tibial runoff. Electronically Signed   By: Lucrezia Europe M.D.   On: 01/08/2016 13:57    ECG  NSR. No ischemic abnormalities.   ASSESSMENT AND PLAN  Active Problems:   Iliac artery occlusion (HCC)   Diabetes (HCC)   HTN (hypertension)   Hypothyroidism  PVD (peripheral vascular disease) ([place])  1. Preoperative assessment: Pt with recent substernal chest discomfort worse with exertion. Also with exertional dyspnea. She has multiple cardiac risk factors including insulin-dependent type 2 diabetes, tobacco abuse and strong family history of premature CAD in first-degree relative. Her sister had a myocardial infarction at age 53. CT angio noted Moderate atheromatous plaque throughout the length of the aorta without aneurysm, dissection, or or high-grade stenosis. With the patient's history and CT findings, there is concern for coronary artery disease. Dr. Radford Pax has seen patient. We will plan for definitive LHC, scheduled as fist case tomorrow morning with Dr. Tamala Julian. Will use right radial access.   Signed, Lyda Jester, PA-C 01/08/2016, 6:12 PM

## 2016-01-09 NOTE — H&P (View-Only) (Signed)
Consult Note  Patient name: Jordan Harvey MRN: [id number] DOB: Jul 02, 1965 Sex: female  Consulting Physician:  ER  Reason for Consult:  Chief Complaint  Patient presents with  . Foot Pain    HISTORY OF PRESENT ILLNESS: This is a 50 year old female who presented to the ER with complaints of right leg numbness and pain for approximately the past week..  The pain starts in her L calf and travels down to the L foot. She describes it as constant. Reports difficulty walking because she is unable to bear weight on her L foot due to numbness and tingling sensation. She has not tried anything for the pain and she cant identify alleviating factors. Denies symptoms in her right leg.  A CTA reveals left iliac occlusion.  She has been started on heparin.  She is a diabetic and reports her blood sugars to be around 110.  She is a smoker.  She is medically managed for hypertension.  She is on a statin for hypercholesterolemia.  Past Medical History:  Diagnosis Date  . Cancer of thyroid ([place])   . Diabetes mellitus without complication ([place])   . Hypertension   . Thyroid disease   . Vitreous floaters of left eye     Past Surgical History:  Procedure Laterality Date  . EYE SURGERY    . THYROID SURGERY  2008   Not sure what they did  . TUBAL LIGATION  1991    Social History   Social History  . Marital status: Single    Spouse name: N/A  . Number of children: N/A  . Years of education: N/A   Occupational History  . Not on file.   Social History Main Topics  . Smoking status: Light Tobacco Smoker    Packs/day: 0.01    Types: Cigarettes  . Smokeless tobacco: Never Used  . Alcohol use No  . Drug use:     Frequency: 1.0 time per week    Types: Marijuana  . Sexual activity: Yes    Birth control/ protection: None, Surgical   Other Topics Concern  . Not on file   Social History Narrative  . No narrative on file    Family History  Problem Relation Age of Onset  .  Breast cancer Mother   . CAD Sister 68    MI    Allergies as of 01/08/2016 - Review Complete 01/08/2016  Allergen Reaction Noted  . Bee venom Shortness Of Breath 04/11/2012    No current facility-administered medications on file prior to encounter.    Current Outpatient Prescriptions on File Prior to Encounter  Medication Sig Dispense Refill  . amitriptyline (ELAVIL) 25 MG tablet Take 25 mg by mouth at bedtime. Reported on 07/19/2015    . amLODipine (NORVASC) 5 MG tablet Take 5 mg by mouth daily. Reported on 07/19/2015    . atorvastatin (LIPITOR) 20 MG tablet Take 20 mg by mouth daily. Reported on 12/11/2015  0  . EDARBYCLOR 40-25 MG TABS Take 1 tablet by mouth daily. Reported on 12/11/2015  5  . furosemide (LASIX) 20 MG tablet Take 20 mg by mouth daily.  3  . gabapentin (NEURONTIN) 600 MG tablet Take 600 mg by mouth 2 (two) times daily.  5  . hydrochlorothiazide (HYDRODIURIL) 25 MG tablet Take 1 tablet (25 mg total) by mouth daily. 30 tablet 1  . levothyroxine (SYNTHROID, LEVOTHROID) 125 MCG tablet Take 125 mcg by mouth daily before breakfast.     .  Liraglutide (VICTOZA) 18 MG/3ML SOPN Inject 0.6 mg into the skin daily.    Marland Kitchen lisinopril (PRINIVIL,ZESTRIL) 10 MG tablet Take 1 tablet (10 mg total) by mouth daily. 30 tablet 1  . metFORMIN (GLUCOPHAGE) 500 MG tablet Take 500 mg by mouth daily with breakfast. Reported on 12/11/2015    . oxyCODONE-acetaminophen (PERCOCET) 7.5-325 MG tablet Take 1 tablet by mouth 3 (three) times daily as needed for moderate pain.   0     REVIEW OF SYSTEMS: Cardiovascular: No chest pain, chest pressure, palpitations, orthopnea, or dyspnea on exertion. Left foot pain and numbness Pulmonary: No productive cough, asthma or wheezing. Neurologic: No weakness, paresthesias, aphasia, or amaurosis. No dizziness. Hematologic: No bleeding problems or clotting disorders. Musculoskeletal: No joint pain or joint swelling. Gastrointestinal: No blood in stool or  hematemesis Genitourinary: No dysuria or hematuria. Psychiatric:: No history of major depression. Integumentary: No rashes or ulcers. Constitutional: No fever or chills.  PHYSICAL EXAMINATION: General: The patient appears their stated age.  Vital signs are BP (!) 88/63 (BP Location: Left Arm) Comment: Rn requested to retake   Pulse 88   Temp 97.7 F (36.5 C) (Oral)   Resp 18   Ht 5\' 3"  (1.6 m)   Wt 169 lb 15.6 oz (77.1 kg)   LMP 03/21/2014   SpO2 96%   BMI 30.11 kg/m  Pulmonary: Respirations are non-labored HEENT:  No gross abnormalities Abdomen: Soft and non-tender  Musculoskeletal: There are no major deformities.   Neurologic: sensation to the left foot is present but decreased.  She has normal motor function of the left foot Skin: There are no ulcer or rashes noted. Psychiatric: The patient has normal affect. Cardiovascular: There is a regular rate and rhythm without significant murmur appreciated.  Monophasic left PT doppler signal.  Palpable right femoral pulse, non-palpable left femoral  Diagnostic Studies: I have reviewed her CTA with the following fidings: 1. 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 2. Two vessel LEFT lower extremity tibial runoff. 3. Progressive long segment arterial occlusive disease in the RIGHT SFA and proximal popliteal artery, reconstituted distally with 2 vessel tibial runoff.    Assessment:  Left Iliac olllusion Plan: I believe she is suffering from left foot numbness secondary to a recent occlusion of her left iliac artery.  She does not have an acutely ischemic left foot that requires emergent intervention, therefore I would like her to be evaluate by cardiology, in case she requires femoral femoral bypass.  Cardiology has seen her and recommends heart cath tomorrow.  Following her cath, I will try to recanalize and stent her left iliac occlusion.  If I am unsuccessful, I would proceed  with femoral femoral bypass.  This is for limb salvage.  I discussed this with the patient in the ER and again in her room.  Her foot has improved a left with IV heparin.  NPO after midnight     V. Leia Alf, M.D. [facility] Office: [phone] Pager:  [phone]

## 2016-01-09 NOTE — Anesthesia Postprocedure Evaluation (Signed)
Anesthesia Post Note  Patient: Jordan Harvey  Procedure(s) Performed: Procedure(s) (LRB): Left Femoral Artery Exposure with INSERTION OF LEFT ILIAC STENT (Left)  Patient location during evaluation: PACU Anesthesia Type: General Level of consciousness: awake and alert Pain management: pain level controlled Vital Signs Assessment: post-procedure vital signs reviewed and stable Respiratory status: spontaneous breathing, nonlabored ventilation, respiratory function stable and patient connected to nasal cannula oxygen Cardiovascular status: blood pressure returned to baseline and stable Postop Assessment: no signs of nausea or vomiting Anesthetic complications: no    Last Vitals:  Vitals:   01/09/16 1329 01/09/16 1900  BP: 104/79   Pulse: (!) 109   Resp: 18 16  Temp: 36.7 C 36.9 C    Last Pain:  Vitals:   01/09/16 1917  TempSrc:   PainSc: 10-Worst pain ever                 Matika Bartell S

## 2016-01-09 NOTE — Transfer of Care (Signed)
Immediate Anesthesia Transfer of Care Note  Patient: Jordan Harvey  Procedure(s) Performed: Procedure(s): Left Femoral Artery Exposure with INSERTION OF LEFT ILIAC STENT (Left)  Patient Location: PACU  Anesthesia Type:General  Level of Consciousness: awake, alert  and oriented  Airway & Oxygen Therapy: Patient Spontanous Breathing and Patient connected to face mask oxygen  Post-op Assessment: Report given to RN and Post -op Vital signs reviewed and stable  Post vital signs: Reviewed and stable  Last Vitals:  Vitals:   01/09/16 0925 01/09/16 1329  BP: 102/76 104/79  Pulse: 84 (!) 109  Resp: 13 18  Temp:  36.7 C    Last Pain:  Vitals:   01/09/16 1329  TempSrc: Oral  PainSc:       Patients Stated Pain Goal: 1 ([id number])  Complications: No apparent anesthesia complications

## 2016-01-09 NOTE — Care Management Note (Signed)
Case Management Note  Patient Details  Name: Jordan Harvey MRN: [id number] Date of Birth: [birth year]  Subjective/Objective:                 Patient admitted with iliac artery occlusion. Had L Heart Cath 01/09/16. Cardiology plan: Plan: 1.  Medical therapy to prevent progression of CAD. 2.  Medical management of diastolic dysfunction. 3.  Continue management of left iliac artery stenosis per vascular surgery.  Currently on Hep Gtt. Consult for CSW for housing concerns. Payor is medicaid PCP is [facility]     Action/Plan:  CM will continue to monitor for home needs.   Expected Discharge Date:                  Expected Discharge Plan:     In-House Referral:  Clinical Social Work  Discharge planning Services  CM Consult  Post Acute Care Choice:    Choice offered to:     DME Arranged:    DME Agency:     HH Arranged:    HH Agency:     Status of Service:  In process, will continue to follow  If discussed at Long Length of Stay Meetings, dates discussed:    Additional Comments:  Carles Collet, RN 01/09/2016, 11:19 AM

## 2016-01-09 NOTE — Anesthesia Preprocedure Evaluation (Addendum)
Anesthesia Evaluation  Patient identified by MRN, date of birth, ID band Patient awake    Reviewed: Allergy & Precautions, NPO status , Patient's Chart, lab work & pertinent test results  Airway Mallampati: II  TM Distance: >3 FB Neck ROM: Full    Dental  (+) Dental Advisory Given, Edentulous Upper, Edentulous Lower   Pulmonary neg pulmonary ROS, Current Smoker,    breath sounds clear to auscultation       Cardiovascular hypertension, + angina + Peripheral Vascular Disease   Rhythm:Regular Rate:Normal  CAD mild by cath   Neuro/Psych negative neurological ROS     GI/Hepatic negative GI ROS, Neg liver ROS, (+)     substance abuse  cocaine use and marijuana use,   Endo/Other  diabetesHypothyroidism   Renal/GU negative Renal ROS     Musculoskeletal   Abdominal   Peds  Hematology   Anesthesia Other Findings   Reproductive/Obstetrics                         Anesthesia Physical Anesthesia Plan  ASA: II  Anesthesia Plan: General   Post-op Pain Management:    Induction: Intravenous  Airway Management Planned: Oral ETT  Additional Equipment:   Intra-op Plan:   Post-operative Plan: Extubation in OR  Informed Consent: I have reviewed the patients History and Physical, chart, labs and discussed the procedure including the risks, benefits and alternatives for the proposed anesthesia with the patient or authorized representative who has indicated his/her understanding and acceptance.   Dental advisory given  Plan Discussed with:   Anesthesia Plan Comments:         Anesthesia Quick Evaluation

## 2016-01-09 NOTE — Progress Notes (Signed)
Paged on call MD in ref. To B/P 76/52 with MAP of 58. She returned call, stated she would take a look at it and possibly give her fluids. Will await orders.

## 2016-01-09 NOTE — Progress Notes (Signed)
TR BAND REMOVAL  LOCATION:    Radial  rt  DEFLATED PER PROTOCOL:   yes  TIME BAND OFF / DRESSING APPLIED:    1005/ small tegaderm  SITE UPON ARRIVAL:    Level  0  SITE AFTER BAND REMOVAL:    Level  0  CIRCULATION SENSATION AND MOVEMENT:    Within Normal Limits :  yes  COMMENTS:   Rt radia; pulse 2+

## 2016-01-09 NOTE — Progress Notes (Signed)
Subjective:  Overnight events: hypotensive 76/52 s/p 565m bolus NS + mIVF NS  Patient seen this AM after LHC. Doing well. Continues to complain of lower L leg pain. No other complaints this morning.   Objective:  Vital signs in last 24 hours: Vitals:   01/09/16 0840 01/09/16 0855 01/09/16 0910 01/09/16 0925  BP: 96/72 93/67 (!) 110/96 102/76  Pulse: 81 84 82 84  Resp: _0 Temp:      TempSrc:      SpO2: 98% 98% 99% 98%  Weight:      Height:        Physical Exam:  General: laying on her R side in bed, in NAD CV: distant heart sounds, RRR, no murmurs appreciated  Chest:  CTAB, no wheezes or crackles, no increased of work breathing  Abd: soft, NTND, +BS, no organomegaly noted  Extremities: L foot pale and cool to the touch with delayed capillary refill noted. R leg warm and well perfused.  Pulses: L dorsalis pedis pulse absent, R dorsalis pedis 1+, tibialis posterior 2+ bilaterally MSK:  Normal bulk and tone x4, R lateral hip tender to palpation and with medial and lateral rotation of leg  Skin: No lesions or rashes noted  Neuro: A&Ox3, decreased sensation on L foot, no sensation abnormalities on the R foot, no LE motor deficits bilaterally   Labs:    Ref. Range 01/09/2016 05:13  WBC Latest Ref Range: 4.0 - 10.5 K/uL 5.9  RBC Latest Ref Range: 3.87 - 5.11 MIL/uL 3.69 (L)  Hemoglobin Latest Ref Range: 12.0 - 15.0 g/dL 10.8 (L)  HCT Latest Ref Range: 36.0 - 46.0 % 34.3 (L)  MCV Latest Ref Range: 78.0 - 100.0 fL 93.0  MCH Latest Ref Range: 26.0 - 34.0 pg 29.3  MCHC Latest Ref Range: 30.0 - 36.0 g/dL 31.5  RDW Latest Ref Range: 11.5 - 15.5 % 15.1  Platelets Latest Ref Range: 150 - 400 K/uL 307     Ref. Range 01/09/2016 05:13  Sodium Latest Ref Range: 135 - 145 mmol/L 138  Potassium Latest Ref Range: 3.5 - 5.1 mmol/L 3.6  Chloride Latest Ref Range: 101 - 111 mmol/L 104  CO2 Latest Ref Range: 22 - 32 mmol/L 26  BUN Latest Ref Range: 6 - 20 mg/dL 15  Creatinine  Latest Ref Range: 0.44 - 1.00 mg/dL 0.88  Calcium Latest Ref Range: 8.9 - 10.3 mg/dL 8.8 (L)  EGFR (Non-African Amer.) Latest Ref Range: >60 mL/min >60  EGFR (African American) Latest Ref Range: >60 mL/min >60  Glucose Latest Ref Range: 65 - 99 mg/dL 118 (H)  Anion gap Latest Ref Range: 5 - 15  8     Ref. Range 01/09/2016 05:13  Cholesterol Latest Ref Range: 0 - 200 mg/dL 175  Triglycerides Latest Ref Range: <150 mg/dL 216 (H)  HDL Cholesterol Latest Ref Range: >40 mg/dL 47  LDL (calc) Latest Ref Range: 0 - 99 mg/dL 85  VLDL Latest Ref Range: 0 - 40 mg/dL 43 (H)  Total CHOL/HDL Ratio Latest Units: RATIO 3.7   TSH 3.065  Imaging/Procedures:   1. LHC 01/09/2016 Prox LAD to Dist LAD lesion, 15 %stenosed.The left ventricular systolic function is normal.LV end diastolic pressure is moderately elevated.The left ventricular ejection fraction is 55-65% by visual estimate.  1.  Mild, non-obstructive coronary artery disease. 2.  Normal left ventricular contraction. 3.  Moderately elevated left ventricular filling pressure (LVEDP 30 mmHg).  Assessment/Plan:  Principal Problem:   Iliac artery  occlusion (HCC) Active Problems:   Diabetes (HCC)   HTN (hypertension)   Hypothyroidism   PVD (peripheral vascular disease) (HCC)   Intermittent chest pain   HLD (hyperlipidemia)   Exertional angina (HCC)   Preoperative cardiovascular examination  Ms. Bryden is a 49 y.o. female with history of T2 IDDM, HTN, thyroid cancer s/p surgery (unclear if thyroidectomy) and current smoker who presents with acute L leg pain associated with L foot pallor, absent dorsalis pedis pulse on palpation, and absent Doppler US waveform concerning for acute limb ischemia. In the ED, she was found to have L iliac artery occlusion on CT angiogram. Her risk factors for PAD include: T2DM, HTN, and smoking. Differential diagnosis for etiology of PAD includes: atherosclerotic disease, embolic source, paradoxical embolus, and  trauma. Given the severity of atherosclerotic disease seen on CT angiogram along with her long history of smoking, T2DM and HTN, PAD 2/2 atherosclerotic disease most likely. Unlikely to be embolus or paradoxical embolus given no evidence of source on workup. She also denies recent trauma.   1.Acute limb ischemia, viable to threatened: Acute L leg pain of 3 day duration associated with L foot pallor with coolness to touch and absent dorsalis pedis pulse on palpation and on Doppler US. Minimal pain on palpation and no motor deficits, but decreased sensation in L foot. This is likely 2/2 to PAD 2/2 significant atherosclerotic disease as evidenced by CT angiogram.  - Risk factors: T2 IDDM, HTN, smoking   - CT angiogram with L iliac arterial system occlusion  - Vascular surgery following with plan for recanalization + stent and possible fem-fem bypass today  - Cleared for surgery by cardiology  - Heparin gtt, hold for vascular surgery  - Continue home amitryptiline, gabapentin, and percocet  for pain  - Oxycodone 5mg q6h PRN   2. Intermittent chest pain: No known cardiac history, but reports intermittent chest pain on exertion and at rest. Denies chest pain on this admission. No findings concerning for ischemia on EKG. Low suspicion for ACS.  - LHC results as above. Per cardiology note: medical management for CAD prevention and medical management for diastolic dysfunction  - Will monitor for chest pain   3. T2DM: diagnosed in June of this year. Normal kidney function. Glucose well controlled since admission. On metformin and victoza at home.  - Hold home metformin and vistoza - SSI  - F/u HbA1c   4. HTN: normotensive on admission. She was hypotensive overnight requiring a 500mL bolus NS with improvement in BP. Currently on mIVF with stable BP.  - Holding home antihypertensive medications   5. Hx of thyroid cancer: TSH 3.065. Type of cancer unknown, unclear if she had thyroidectomy  - Continue  home synthroid 125mcg daily    6. Polysubstance abuse: Current smoker and THC user + cocaine use in the past. UDS positive for THC and cocaine.  - Nicotine patch 7mg + nicorette gum   - HIV pending    Dispo: Anticipated discharge in approximately 1-2day(s).   LOS: 1 day   Idalys Santos-Sanchez, Medical Student 01/09/2016, 12:12 PM Pager: [phone]  

## 2016-01-09 NOTE — Progress Notes (Signed)
Baby aspirin administered right before patient went to cath lab.  Heparin was stopped and put on hold as per order.  At the time patient was picked up, the 500 cc bolus was still infusing.

## 2016-01-09 NOTE — Interval H&P Note (Signed)
History and Physical Interval Note:  01/09/2016 4:22 PM  Jordan Harvey  has presented today for surgery, with the diagnosis of peripheral artery disease  The various methods of treatment have been discussed with the patient and family. After consideration of risks, benefits and other options for treatment, the patient has consented to  Procedure(s): INSERTION OF ILIAC STENT (Left) BYPASS GRAFT FEMORAL-FEMORAL ARTERY (Left) as a surgical intervention .  The patient's history has been reviewed, patient examined, no change in status, stable for surgery.  I have reviewed the patient's chart and labs.  Questions were answered to the patient's satisfaction.     [name]

## 2016-01-09 NOTE — Progress Notes (Signed)
ANTICOAGULATION CONSULT NOTE - Follow Up Consult  Pharmacy Consult for Heparin Indication: iliac arterial occlusion  Allergies  Allergen Reactions  . Bee Venom Shortness Of Breath    Patient Measurements: Height: 5\' 3"  (160 cm) Weight: 195 lb 4.8 oz (88.6 kg) IBW/kg (Calculated) : 52.4 Heparin Dosing Weight: 69 kg  Vital Signs: Temp: 97.7 F (36.5 C) (08/04 0523) Temp Source: Oral (08/04 0523) BP: 102/76 (08/04 0925) Pulse Rate: 84 (08/04 0925)  Labs:  Recent Labs  01/08/16 1059 01/08/16 1108 01/08/16 2109 01/09/16 0513  HGB 11.9* 12.6  --  10.8*  HCT 37.0 37.0  --  34.3*  PLT 323  --   --  307  LABPROT 12.2  --   --  12.5  INR 0.90  --   --  0.93  HEPARINUNFRC  --   --  0.13* 0.37  CREATININE 1.06* 1.00  --  0.88    Estimated Creatinine Clearance: 81.7 mL/min (by C-G formula based on SCr of 0.88 mg/dL).  Assessment:   S/p cardiac cath this morning;  Scheduled for OR this afternoon for iliac stent and peripheral bypass surgery.            Heparin level was therapeutic (0.37) this morning on 1100 units/hr.  Labs drawn before heparin turned off for cardiac cath.    Heparin drip to resume 4 hrs post-sheath removal post-cath.  Sheath removed ~811am.  Site noted without bleeding or hematoma.    Spoke briefly with Dr. Trula Slade.  OK to resume heparin, which will be stopped again on call to OR.     Goal of Therapy:  Heparin level 0.3-0.7 units/ml Monitor platelets by anticoagulation protocol: Yes   Plan:   Resume heparin drip ~12:15pm at 1100 units/hr.  Will follow up post-op  Arty Baumgartner, [place] Pager: [phone] 01/09/2016,11:39 AM

## 2016-01-09 NOTE — Progress Notes (Signed)
At bedside reporting, I was told that patient is from a shelter, and is planning on getting an apartment next week.  No screening for MRSA was done. I did PCR and for surgical.  Surgical did not need to be done, she is not going to OR, she is going to cath lab.  I phoned the lab and told them not to do surgical swab for MRSA.

## 2016-01-09 NOTE — Op Note (Signed)
Patient name: Cresha Leider MRN: [id number] DOB: 12-29-1965 Sex: female  01/08/2016 - 01/09/2016 Pre-operative Diagnosis: Left foot ischemia Post-operative diagnosis:  Same Surgeon:  [name] Assistants:  Leontine Locket Procedure:   #1: Open Left common femoral artery exposure   #2: Stent, left common iliac artery   #3: Abdominal aortogram   #4: First-order catheterization (right common iliac artery)    #5: Bilateral lower extremity runoff Anesthesia:  Gen. Blood Loss:  See anesthesia record Specimens:  None  Findings:  Left common iliac artery occlusion was easily crossed  Device used: Gore VBX 9x59  Indications:  Patient presented to the hospital yesterday with complaints of left foot numbness which have been present for at least 3 days.  CT scan imaging revealed an occluded left common iliac artery.  She underwent cardiac catheterization via a right radial approach earlier today for surgical clearance and her coronaries were clean.  She comes in today for revascularization of her left leg.  Procedure:  The patient was identified in the holding area and taken to [facility]  The patient was then placed supine on the table. general anesthesia was administered.  The patient was prepped and draped in the usual sterile fashion.  A time out was called and antibiotics were administered.  An oblique incision was made in the left groin.  Cautery was used to divide subcutaneous days tissue down the femoral sheath which was opened sharply.  The common femoral artery was exposed from the anal ligament down to the bifurcation.  The superficial femoral and profunda femoral artery were individually isolated.  The patient was fully heparinized.  After the heparin circulated, a separate stab incision was made below the oblique incision and the left common femoral artery was cannulated with a micropuncture needle.  A 018 wire was advanced without resistance followed by the placement of a  micropuncture sheath.  Next, a Glidewire was advanced.  This went easily across the occlusion into the aorta.  I therefore placed a 8 French sheath and advanced this into the aorta cross the occlusion.  An abdominal aortogram was performed through the sheath which confirmed reentry into the aorta.  I elected to primarily stent this area.  I selected a Gore VBX 9 x 59 device.  It was deployed landing at the origin of the left common iliac artery extending distally.  The balloon was taken to nominal pressure.  I then performed a completion arteriogram which revealed a widely patent left iliac system.  There was a small caliber external iliac artery without stenosis.  Next, I advanced the Omni Flush catheter back into the aorta across the bifurcation to place a catheter in the right iliac artery.  I evaluated the right common and external iliac artery which are widely patent.  The common femoral and profunda femoral and superficial femoral artery that were visualized in the proximal thigh was also remained widely patent.  No evidence of embolic disease was visualized.  There was a similar appearance of the external iliac, common femoral profunda femoral and superficial femoral artery on the left.  Therefore I elected not to treat the external iliac artery narrowing.  The patient had excellent pulsatile flow into the groin on the left.  At this point the sheath was removed.  The arteriotomy was closed with 2 running 6-0 proline suture.  The patient has excellent dorsalis pedis Doppler signal.  50 mg of protamine were given.  The femoral sheath was reapproximated with 2-0  Vicryl.  Subcutaneous tissue was then closed with several layers of 3-0 Vicryl, followed by Dermabond.   Disposition:  To PACU in stable condition.   Theotis Burrow, M.D. [facility] [place] Office: [phone] Pager:  [phone]

## 2016-01-09 NOTE — Progress Notes (Signed)
Internal Medicine Attending:   I saw and examined the patient. I reviewed the resident's note and I agree with the resident's findings and plan as documented in the resident's note.  Left heart catheterization shows mild diffuse coronary artery disease which can be managed medically. She will proceed on to thrombectomy with vascular surgery today. We will follow-up with them regarding postoperative heparin or antiplatelet therapy.

## 2016-01-10 DIAGNOSIS — M25552 Pain in left hip: Secondary | ICD-10-CM

## 2016-01-10 DIAGNOSIS — G8929 Other chronic pain: Secondary | ICD-10-CM

## 2016-01-10 LAB — CBC
HEMATOCRIT: 33.1 % — AB (ref 36.0–46.0)
HEMOGLOBIN: 10.4 g/dL — AB (ref 12.0–15.0)
MCH: 29.3 pg (ref 26.0–34.0)
MCHC: 31.4 g/dL (ref 30.0–36.0)
MCV: 93.2 fL (ref 78.0–100.0)
PLATELETS: 284 10*3/uL (ref 150–400)
RBC: 3.55 MIL/uL — AB (ref 3.87–5.11)
RDW: 14.9 % (ref 11.5–15.5)
WBC: 8.2 10*3/uL (ref 4.0–10.5)

## 2016-01-10 LAB — GLUCOSE, CAPILLARY
GLUCOSE-CAPILLARY: 121 mg/dL — AB (ref 65–99)
GLUCOSE-CAPILLARY: 125 mg/dL — AB (ref 65–99)
GLUCOSE-CAPILLARY: 140 mg/dL — AB (ref 65–99)
GLUCOSE-CAPILLARY: 169 mg/dL — AB (ref 65–99)
Glucose-Capillary: 151 mg/dL — ABNORMAL HIGH (ref 65–99)
Glucose-Capillary: 104 mg/dL — ABNORMAL HIGH (ref 65–99)
Glucose-Capillary: 132 mg/dL — ABNORMAL HIGH (ref 65–99)

## 2016-01-10 LAB — BASIC METABOLIC PANEL
Anion gap: 6 (ref 5–15)
BUN: 11 mg/dL (ref 6–20)
CHLORIDE: 104 mmol/L (ref 101–111)
CO2: 28 mmol/L (ref 22–32)
CREATININE: 0.9 mg/dL (ref 0.44–1.00)
Calcium: 8.8 mg/dL — ABNORMAL LOW (ref 8.9–10.3)
Glucose, Bld: 117 mg/dL — ABNORMAL HIGH (ref 65–99)
Potassium: 4.1 mmol/L (ref 3.5–5.1)
SODIUM: 138 mmol/L (ref 135–145)

## 2016-01-10 LAB — HEMOGLOBIN A1C
HEMOGLOBIN A1C: 7 % — AB (ref 4.8–5.6)
Mean Plasma Glucose: 154 mg/dL

## 2016-01-10 NOTE — Progress Notes (Addendum)
Progress Note 01/10/2016 7:16 AM 1 Day Post-Op  Subjective:  C/o abdominal pain on the left (points to just above the incision)-tried to have a bowel movement this morning but couldn't; says her foot feels a lot better  Afebrile HR  70's-90's NSR 0000000 systolic 0000000 RA  Vitals:   01/10/16 0124 01/10/16 0319  BP: 112/60 109/78  Pulse: 79 77  Resp:  17  Temp: 97.5 F (36.4 C) 97.4 F (36.3 C)    Physical Exam: Cardiac:  regular Lungs:  Non labored Incisions:  Left groin incision is clean and dry Extremities:  Easily palpable left DP pulse Abdomen:  Soft; mildly tender to palpation LUQ and LLQ; decreased BS  CBC    Component Value Date/Time   WBC 8.2 01/10/2016 0330   RBC 3.55 (L) 01/10/2016 0330   HGB 10.4 (L) 01/10/2016 0330   HCT 33.1 (L) 01/10/2016 0330   PLT 284 01/10/2016 0330   MCV 93.2 01/10/2016 0330   MCH 29.3 01/10/2016 0330   MCHC 31.4 01/10/2016 0330   RDW 14.9 01/10/2016 0330   LYMPHSABS 1.8 01/08/2016 1059   MONOABS 0.4 01/08/2016 1059   EOSABS 0.1 01/08/2016 1059   BASOSABS 0.0 01/08/2016 1059    BMET    Component Value Date/Time   NA 138 01/10/2016 0330   K 4.1 01/10/2016 0330   CL 104 01/10/2016 0330   CO2 28 01/10/2016 0330   GLUCOSE 117 (H) 01/10/2016 0330   BUN 11 01/10/2016 0330   CREATININE 0.90 01/10/2016 0330   CALCIUM 8.8 (L) 01/10/2016 0330   GFRNONAA >60 01/10/2016 0330   GFRAA >60 01/10/2016 0330    INR    Component Value Date/Time   INR 0.93 01/09/2016 0513     Intake/Output Summary (Last 24 hours) at 01/10/16 0716 Last data filed at 01/09/16 1857  Gross per 24 hour  Intake             1000 ml  Output               50 ml  Net              950 ml     Assessment:  50 y.o. female is s/p:  Procedure:                         #1: Open Left common femoral artery exposure                                           #2: Stent, left common iliac artery                                           #3: Abdominal  aortogram                                             #4: First-order catheterization (right common iliac artery)                                                          #  5: Bilateral lower extremity runoff  1 Day Post-Op  Plan: -pt doing well from bypass with easily palpable left DP pulse. -creatinine is good this morning after receiving IV contrast yesterday -acute surgical blood loss anemia-pt tolerating -mild abdominal pain on the left this morning-abdomen is soft.  Will give dulcolax supp this morning.  Pain improves with pain medication -DVT prophylaxis:  Lovenox   Leontine Locket, PA-C [facility] [phone] 01/10/2016 7:16 AM  I have interviewed the patient and examined the patient. I agree with the findings by the PA. Left groin incision looks fine Palpable left DP pulse Renal function normal Ok for discharge from our standpoint.   Gae Gallop, MD [phone]

## 2016-01-10 NOTE — Evaluation (Signed)
Physical Therapy Evaluation Patient Details Name: Jordan Harvey MRN: [id number] DOB: 1965/11/28 Today's Date: 01/10/2016   History of Present Illness  50 y.o. female admitted to [facility] on 01/08/16 for L foot numbness and tingling.  Dx wtih left illiac artery occlusion s/p L common illiac artery stent, abdominal aortogram, R common illiac artery catherization, and bil LE runoff on 01/09/16.  Pt with significant PMHx of HTN, DM, thyroid CA.    Clinical Impression  Pt is painful and guarded during gait with continued left hip pain despite improved left foot pain.  She does find some support with use of RW during gait.   PT to follow acutely for deficits listed below.       Follow Up Recommendations No PT follow up;Supervision for mobility/OOB    Equipment Recommendations  Rolling walker with 5" wheels;3in1 (PT)    Recommendations for Other Services   NA    Precautions / Restrictions   NA     Mobility  Bed Mobility Overal bed mobility: Needs Assistance Bed Mobility: Supine to Sit     Supine to sit: HOB elevated;Supervision     General bed mobility comments: HOB mildly elevated and pt pulling on bed rail for support during transitions, keeping pillow at abomen for support.   Transfers Overall transfer level: Needs assistance Equipment used: Rolling walker (2 wheeled) Transfers: Sit to/from Stand Sit to Stand: Min guard         General transfer comment: Min guard assist for support during transitions.   Ambulation/Gait Ambulation/Gait assistance: Min guard Ambulation Distance (Feet): 45 Feet Assistive device: Rolling walker (2 wheeled) Gait Pattern/deviations: Step-through pattern;Trunk flexed;Antalgic Gait velocity: decreased Gait velocity interpretation: <1.8 ft/sec, indicative of risk for recurrent falls General Gait Details: Pt with antalgic gait pattern favoring left leg.  Pt leaning on hands quite a bit and very slow gait.          Balance Overall balance  assessment: Needs assistance Sitting-balance support: Feet supported;No upper extremity supported Sitting balance-Leahy Scale: Fair     Standing balance support: Bilateral upper extremity supported Standing balance-Leahy Scale: Poor                               Pertinent Vitals/Pain Pain Assessment: Faces Faces Pain Scale: Hurts worst Pain Location: incisional and left lateral hip Pain Descriptors / Indicators: Aching;Burning Pain Intervention(s): Limited activity within patient's tolerance;Monitored during session;Repositioned;Patient requesting pain meds-RN notified    Home Living Family/patient expects to be discharged to:: Shelter/Homeless (per chart, pt did not volunteer this info during our session)   Available Help at Discharge: Other (Comment) (boyfriend) Type of Home: House Home Access: Level entry     Home Layout: One level Home Equipment: None      Prior Function Level of Independence: Needs assistance   Gait / Transfers Assistance Needed: difficulty walking, used boyfriend for gait PTA              Extremity/Trunk Assessment   Upper Extremity Assessment: Defer to OT evaluation           Lower Extremity Assessment: LLE deficits/detail   LLE Deficits / Details: left leg functionally antalgic during gait with significant pain during transitions sit<-> stand.  Pt is able to lift left leg mininally against gravity to progress it over EOB.   Cervical / Trunk Assessment: Normal  Communication   Communication: No difficulties  Cognition Arousal/Alertness: Awake/alert Behavior During Therapy: WFL for  tasks assessed/performed Overall Cognitive Status: Within Functional Limits for tasks assessed                               Assessment/Plan    PT Assessment Patient needs continued PT services  PT Diagnosis Difficulty walking;Abnormality of gait;Generalized weakness;Acute pain   PT Problem List Decreased strength;Decreased  activity tolerance;Decreased balance;Decreased mobility;Decreased knowledge of use of DME;Pain;Obesity  PT Treatment Interventions DME instruction;Gait training;Stair training;Functional mobility training;Therapeutic activities;Therapeutic exercise;Balance training;Neuromuscular re-education;Cognitive remediation;Patient/family education   PT Goals (Current goals can be found in the Care Plan section) Acute Rehab PT Goals Patient Stated Goal: to decrease pain PT Goal Formulation: With patient Time For Goal Achievement: 01/24/16 Potential to Achieve Goals: Good    Frequency Min 4X/week           End of Session   Activity Tolerance: Patient limited by pain Patient left: in chair;with call bell/phone within reach;with family/visitor present Nurse Communication: Mobility status;Patient requests pain meds         Time: [id number] PT Time Calculation (min) (ACUTE ONLY): 30 min   Charges:   PT Evaluation $PT Eval Moderate Complexity: 1 Procedure PT Treatments $Gait Training: 8-22 mins        Equan Cogbill B. [place], [place], DPT [phone]   01/10/2016, 2:52 PM

## 2016-01-10 NOTE — Progress Notes (Signed)
Internal Medicine Attending:   I saw and examined the patient. I reviewed the resident's note and I agree with the resident's findings and plan as documented in the resident's note.  50 year old woman admitted with chronic left limb ischemia, she did well yesterday with operative stenting to an occlusive thrombus in the left common iliac artery. Pain in the left foot is improved today, there is a palpable left DP pulse. She still has pain in the left hip, over the greater trochanter that is worse with movement. I wonder if this could be from the lesions of the external iliac artery. I want to see how she does with ambulation and activity today. We will plan for plavix for antiplatelet therapy, as long as she can afford it as an outpatient. Also started atorva 40.

## 2016-01-10 NOTE — Progress Notes (Addendum)
   Subjective:  S/p  Stent placement of left common iliac artery 8/4 pm.  States her left foot pain is gone. Still has her chronic left lateral hip pain. Has some sore throat, feels tired overall.    Objective:  Vital signs in last 24 hours: Vitals:   01/09/16 2144 01/09/16 2353 01/10/16 0124 01/10/16 0319  BP: (!) 104/57 114/87 112/60 109/78  Pulse: 69 75 79 77  Resp: 18 15  17   Temp: 97.5 F (36.4 C) 97.7 F (36.5 C) 97.5 F (36.4 C) 97.4 F (36.3 C)  TempSrc: Oral Oral Oral Oral  SpO2: 96% 96% 96% 92%  Weight:      Height:       Vitals reviewed. General: resting in bed, NAD HEENT: artificial R eye, left eye pupil normal.  Cardiac: RRR, no rubs, murmurs or gallops Pulm: clear to auscultation bilaterally Abd: soft, nontender, nondistended, BS present Ext: left lateral hip has some ttp over the greater trochanteric bursa. 1+ pulses PT and DP bilaterally, warm to touch, no pain.  Neuro: alert and oriented X3, cranial nerves II-XII grossly intact, strength and sensation to light touch equal in bilateral upper and lower extremities   Assessment/Plan:  Principal Problem:   Iliac artery occlusion (HCC) Active Problems:   Diabetes (HCC)   HTN (hypertension)   Hypothyroidism   PVD (peripheral vascular disease) (HCC)   Intermittent chest pain   HLD (hyperlipidemia)   Exertional angina (HCC)   Preoperative cardiovascular examination  Ischemic left foot s/p Left common iliac stent 8/4 with significant overall PVD Initial CTA showed Left iliac occlusion, also showed right SFA disease. S/p Left iliac stent placement. Foot pain has resolved. Still has left hip pain (bursitis vs muscle ischemia from PVD?). - appreciate vascular surgery's assistance.  - started plavix daily. Will stop asa (we started asa on admission).  - cont increased dose of lipitor 40mg  daily - smoking cessation strongly encouraged - cont percocet for hip pain, needs outpatient follow up for this - will  likely discharge tomorrow  CAD -Has intermittent chest pain, went for [facility] for pre-op evaluation on 8/4 which showed mild non obstructive CAD with some diastolic dysfunction - lipitor 40mg  as above. Continue to treat HTN to goal of <140/90 to help with her diastolic dysfunction.    DM II - hgba1c 7.0. At goal.  - cont SSI here, resume home metformin and victoza on discharge - cont gabapentin and amitriptyline home dose to help with with her chronic pain. Not sure if this is helping much.   HTN - Home meds listed include: amlodipine 5mg  + lasix 20mg  + hctz 25mg  + lisinopril 10 + Edarbyclor 40-25mg . Likely not taking any meds at home. We held all meds and her BP remained in low 100's for last few days. - watch without meds for now as her BP is running in low 100's. May benefit from AceI or ARB resumption on discharge with her DM II  Dispo: Anticipated discharge in approximately 1-2 day(s).   LOS: 2 days   Dellia Nims, MD 01/10/2016, 7:20 AM Pager: [phone]

## 2016-01-11 DIAGNOSIS — I745 Embolism and thrombosis of iliac artery: Secondary | ICD-10-CM | POA: Diagnosis not present

## 2016-01-11 LAB — BASIC METABOLIC PANEL
Anion gap: 9 (ref 5–15)
BUN: 10 mg/dL (ref 6–20)
CALCIUM: 9 mg/dL (ref 8.9–10.3)
CO2: 24 mmol/L (ref 22–32)
CREATININE: 0.87 mg/dL (ref 0.44–1.00)
Chloride: 104 mmol/L (ref 101–111)
GFR calc Af Amer: >60 mL/min (ref >60)
GLUCOSE: 116 mg/dL — AB (ref 65–99)
Potassium: 4.1 mmol/L (ref 3.5–5.1)
Sodium: 137 mmol/L (ref 135–145)

## 2016-01-11 LAB — CBC
HCT: 32.1 % — ABNORMAL LOW (ref 36.0–46.0)
Hemoglobin: 10.4 g/dL — ABNORMAL LOW (ref 12.0–15.0)
MCH: 29.5 pg (ref 26.0–34.0)
MCHC: 32.4 g/dL (ref 30.0–36.0)
MCV: 90.9 fL (ref 78.0–100.0)
PLATELETS: 284 10*3/uL (ref 150–400)
RBC: 3.53 MIL/uL — ABNORMAL LOW (ref 3.87–5.11)
RDW: 14.9 % (ref 11.5–15.5)
WBC: 7.2 10*3/uL (ref 4.0–10.5)

## 2016-01-11 LAB — GLUCOSE, CAPILLARY: Glucose-Capillary: 128 mg/dL — ABNORMAL HIGH (ref 65–99)

## 2016-01-11 MED ORDER — CLOPIDOGREL BISULFATE 75 MG PO TABS: 75.0000 mg | ORAL_TABLET | Freq: Every day | ORAL | 3 refills | Status: DC

## 2016-01-11 MED ORDER — OXYCODONE-ACETAMINOPHEN 7.5-325 MG PO TABS: 1.0000 | ORAL_TABLET | Freq: Three times a day (TID) | ORAL | 0 refills | Status: DC | PRN

## 2016-01-11 MED ORDER — ASPIRIN 81 MG PO TBEC: 81.0000 mg | DELAYED_RELEASE_TABLET | Freq: Every day | ORAL | 0 refills | Status: AC

## 2016-01-11 MED ORDER — ASPIRIN EC 81 MG PO TBEC
81.0000 mg | DELAYED_RELEASE_TABLET | Freq: Every day | ORAL | Status: DC
  Administered 2016-01-11: 81 mg via ORAL
  Filled 2016-01-11: qty 1

## 2016-01-11 MED ORDER — ATORVASTATIN CALCIUM 20 MG PO TABS: 40.0000 mg | ORAL_TABLET | Freq: Every day | ORAL | 0 refills | Status: DC

## 2016-01-11 MED ORDER — NICOTINE 7 MG/24HR TD PT24: 7.0000 mg | MEDICATED_PATCH | TRANSDERMAL | 0 refills | Status: DC

## 2016-01-11 MED ORDER — NICOTINE POLACRILEX 2 MG MT GUM: 2.0000 mg | CHEWING_GUM | OROMUCOSAL | 0 refills | Status: DC | PRN

## 2016-01-11 NOTE — Progress Notes (Signed)
Subjective:  No acute events overnight. This AM patient complained of pain at the surgical incision site. She also reported some numbness in her L foot, but no pain.    Objective:  Vital signs in last 24 hours: Vitals:   01/10/16 1525 01/10/16 2030 01/11/16 0042 01/11/16 0500  BP: (!) 116/96 (!) 116/92 115/83 94/80  Pulse: (!) 101 97 89 99  Resp: (!) 21 20 19 16   Temp: 98.6 F (37 C) 98.6 F (37 C) 98.8 F (37.1 C) 98.8 F (37.1 C)  TempSrc: Oral Oral Oral Oral  SpO2: 94% 96% 95% 95%  Weight:      Height:       Physical Exam:  General: sleeping in bed, in NAD  CV: RRR, nl S1/S2, no m/r/g Chest: CTAB, no wheezes or crackles, no increased work of breathing Abd: soft, NTND, no rebound or guarding, groin incision site with mild swelling but c/d/i with no erythema or oozing and no signs of infection  Extremities: L foot warm to the touch, R foot warm and well perfused, DS pulses 1+ bilaterally, no pain on palpation of both feet  Neuro: A&Ox3, sensation over LE intact bilaterally   Labs/Studies:  No new labs or imaging to review   Assessment/Plan:  Principal Problem:   Iliac artery occlusion (HCC) Active Problems:   Diabetes (HCC)   HTN (hypertension)   Hypothyroidism   PVD (peripheral vascular disease) (HCC)   Intermittent chest pain   HLD (hyperlipidemia)   Exertional angina (HCC)   Preoperative cardiovascular examination  Jordan Harvey is a 50yo female who presented with acute L lower leg pain and pallor associated with absent DP pulse and was found to have a L iliac artery occlusion on CT angiography. Her symptoms are much improved, but she now reports pain at the incision site. On exam, incision site looked clean, dry, and intact. There was no warmth, erythema or oozing noted and no signs of an infected wound. I suspect this is expected post-operative pain.   1. Acute L lower extremity ischemia 2/2 L iliac artery occlusion: s/p stent placement to L common iliac  artery. She is experiencing some numbness in her L foot, but no pain since her surgery 2 days ago. She complained of pain at her incision site today. No findings concerning for infection. No erythema, warmth, oozing, or hematoma appreciated on exam.  As mentioned above, I suspect this is likely post-op pain. Per vascular surgery note, they agere.  - Vascular surgery following, will f/u with Dr. Chaya Jan  - Continue plavix, will add ASA  - Continue Lipitor 40mg  daily  - Encouraged smoking cessation  - Continue percocet for chronic pain  - Discharge to shelter today   2. CAD: Reported intermittent chest pain on admission. She is s/p [facility] on 8/4 for pre-op evaluation which showed mild, nonobstructive CAD with mild diastolic dysfunction. She has not complained of chest pain during this admission.  - Lipitor 40mg  daily  - Continue to monitor BP  - Encourage drug counseling as cocaine can worsen intermittent cardiac symptoms she has been experiencing. It is very likely that cocaine use is also the etiology of her cardiac symptoms.   3. T2DM: Her A1c is at goal and her BG have been normal during this admission.  - Currently on SSI. Wiill plan to restart her home metformin and victoza on discharge. - On home gabapentin and amitryptiline for neuropathy      4. HTN: On amlodipine 5, lisinopril 10,  HCTZ 25, and edarbyclor 40-25 at home. States she is compliant with these. However, per EMR, she is non-adherent with medication. Her BP since admission has been low, despite administration of fluids. Low suspicion for sepsis at this time. She has been afebrile during this admission, no sources of infection identified, and no organ dysfunction.  -  Will not restart home antihypertensive medications given her soft BPs  - Recommend appt with her PCP 2-3 days after discharge for optimization of meds    5. Hypothyroidism: TSH 3.065 - Continue home synthroid 157mcg daily   6. Polysubstance abuse: Current smoker + UDS  positve for cocaine and THC. No signs of withdrawal during this admission.  - HIV non reactive  - nicotine patch 7mg  + nicorrete gum    Dispo: Anticipated discharge in approximately today - 1day(s).   LOS: 3 days   Welford Roche, Medical Student 01/11/2016, 6:52 AM Pager: [phone]

## 2016-01-11 NOTE — Progress Notes (Signed)
Internal Medicine Attending:   I saw and examined the patient. I reviewed the resident's note and I agree with the resident's findings and plan as documented in the resident's note.  50 year old woman admitted 3 days ago with chronic ischemia of the left lower extremity due to a thrombus in her left common iliac artery which was operatively managed with iliac artery stenting  2 days ago. Overnight she had increasing pain at the left groin incision site. The incision looks clean and dry, there is no associated erythema or drainage, I don't feel any hematoma underneath it just the usual local swelling, there is palpable pulses throughout the left leg. Vascular surgery PA saw the patient's and agrees that the incision looks normal, likely this pain is related to just local inflammation. I think it's okay to let her discharge to home, she is going to use a walker for ambulation and follow-up with vascular surgery and continue aspirin and Plavix.

## 2016-01-11 NOTE — Discharge Summary (Signed)
Name: Jordan Harvey MRN: [id number] DOB: 1965/09/11 50 y.o. PCP: Elizabeth Palau, MD (Inactive)  Date of Admission: 01/08/2016  9:11 AM Date of Discharge: 01/11/2016 Attending Physician: Axel Filler, MD  Discharge Diagnosis:  Principal Problem:   Iliac artery occlusion [facility]) Active Problems:   Diabetes ([place])   HTN (hypertension)   Hypothyroidism   PVD (peripheral vascular disease) ([place])   Intermittent chest pain   HLD (hyperlipidemia)   Exertional angina ([place])   Preoperative cardiovascular examination   Discharge Medications:   Medication List    STOP taking these medications   amLODipine 5 MG tablet Commonly known as:  NORVASC   EDARBYCLOR 40-25 MG Tabs Generic drug:  Azilsartan-Chlorthalidone   furosemide 20 MG tablet Commonly known as:  LASIX   hydrochlorothiazide 25 MG tablet Commonly known as:  HYDRODIURIL   lisinopril 10 MG tablet Commonly known as:  PRINIVIL,ZESTRIL     TAKE these medications   amitriptyline 25 MG tablet Commonly known as:  ELAVIL Take 25 mg by mouth at bedtime. Reported on 07/19/2015   aspirin 81 MG EC tablet Take 1 tablet (81 mg total) by mouth daily.   atorvastatin 20 MG tablet Commonly known as:  LIPITOR Take 2 tablets (40 mg total) by mouth daily. Reported on 12/11/2015 What changed:  how much to take   clopidogrel 75 MG tablet Commonly known as:  PLAVIX Take 1 tablet (75 mg total) by mouth daily.   DUREZOL 0.05 % Emul Generic drug:  Difluprednate Place 2 drops into the left eye daily.   gabapentin 600 MG tablet Commonly known as:  NEURONTIN Take 600 mg by mouth 2 (two) times daily.   levothyroxine 125 MCG tablet Commonly known as:  SYNTHROID, LEVOTHROID Take 125 mcg by mouth daily before breakfast.   metFORMIN 500 MG tablet Commonly known as:  GLUCOPHAGE Take 500 mg by mouth daily with breakfast. Reported on 12/11/2015   nicotine 7 mg/24hr patch Commonly known as:  NICODERM CQ - dosed in mg/24  hr Place 1 patch (7 mg total) onto the skin daily.   nicotine polacrilex 2 MG gum Commonly known as:  NICORETTE Take 1 each (2 mg total) by mouth as needed for smoking cessation.   oxyCODONE-acetaminophen 7.5-325 MG tablet Commonly known as:  PERCOCET Take 1 tablet by mouth 3 (three) times daily as needed for moderate pain.   VICTOZA 18 MG/3ML Sopn Generic drug:  Liraglutide Inject 0.6 mg into the skin daily.       Disposition and follow-up:   Ms.Nary Bobbye Shelstad Harvey discharged from [facility] in Stable condition.  At the hospital follow up visit please address:  1.  Patient's BP remained low in 90-100's without any blood pressure meds in the hospital. I am not sure why she Harvey on so many BP meds. Please assess for her compliance with meds and see if she actually needs any of these. We stopped all BP meds on discharge.   Needs to follow up with vascular surgery.  Needs to comply with aspirin + plavix. Also needs to take lipitor 40mg  daily.  Needs to avoid smoking and cocaine use.  Please follow up on her hip pain.  2.  Labs / imaging needed at time of follow-up:   3.  Pending labs/ test needing follow-up:   Follow-up Appointments: Follow-up Information    [name], MD In 2 weeks.   Specialties:  Vascular Surgery, Cardiology Why:  Office will call you to arrange your  appt (sent) Contact information: [place] [address] [phone]          Left Heart Cath and Coronary Angiography  Conclusion     Prox LAD to Dist LAD lesion, 15 %stenosed.  The left ventricular systolic function is normal.  LV end diastolic pressure is moderately elevated.  The left ventricular ejection fraction is 55-65% by visual estimate.   1.  Mild, non-obstructive coronary artery disease. 2.  Normal left ventricular contraction. 3.  Moderately elevated left ventricular filling pressure (LVEDP 30 mmHg).  Plan: 1.  Medical therapy to  prevent progression of CAD. 2.  Medical management of diastolic dysfunction. 3.  Continue management of left iliac artery stenosis per vascular surgery.    CTAngio IMPRESSION: 1. 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 2. Two vessel LEFT lower extremity tibial runoff. 3. Progressive long segment arterial occlusive disease in the RIGHT SFA and proximal popliteal artery, reconstituted distally with 2 vessel tibial runoff.  CBC Latest Ref Rng & Units 01/11/2016 01/10/2016 01/09/2016  WBC 4.0 - 10.5 K/uL 7.2 8.2 5.9  Hemoglobin 12.0 - 15.0 g/dL 10.4(L) 10.4(L) 10.8(L)  Hematocrit 36.0 - 46.0 % 32.1(L) 33.1(L) 34.3(L)  Platelets 150 - 400 K/uL 284 284 307   CMP Latest Ref Rng & Units 01/11/2016 01/10/2016 01/09/2016  Glucose 65 - 99 mg/dL 116(H) 117(H) 118(H)  BUN 6 - 20 mg/dL 10 11 15   Creatinine 0.44 - 1.00 mg/dL 0.87 0.90 0.88  Sodium 135 - 145 mmol/L 137 138 138  Potassium 3.5 - 5.1 mmol/L 4.1 4.1 3.6  Chloride 101 - 111 mmol/L 104 104 104  CO2 22 - 32 mmol/L 24 28 26   Calcium 8.9 - 10.3 mg/dL 9.0 8.8(L) 8.8(L)  Total Protein 6.5 - 8.1 g/dL - - -  Total Bilirubin 0.3 - 1.2 mg/dL - - -  Alkaline Phos 38 - 126 U/L - - -  AST 15 - 41 U/L - - -  ALT 14 - 54 U/L - - [facility] Course by problem list: Principal Problem:   Iliac artery occlusion ([place]) Active Problems:   Diabetes ([place])   HTN (hypertension)   Hypothyroidism   PVD (peripheral vascular disease) (HCC)   Intermittent chest pain   HLD (hyperlipidemia)   Exertional angina (HCC)   Preoperative cardiovascular examination  Ischemic left foot s/p Left common iliac stent 8/4 with significant overall PVD Presented with left foot numbness/pain for 3-4 days. Initial CTA showed Left iliac occlusion, also showed right SFA disease. S/p Left iliac stent placement 8/4 . Foot pain has resolved since the procedure. Still has left hip pain (Harvey more lateral in the past which we  thought could be from bursitis vs some vasospasm from her PAD, but today it's more in her groin where the incision site is). No hematoma or signs of infection.  - appreciate vascular surgery's assistance. Asked them to re-evaluate the patient's incision site and they agreed that it did not look concerning.  - started plavix daily along with asa daily.  - increased dose of lipitor 40mg  daily - smoking cessation strongly encouraged, also cocaine cessation.  - cont percocet for hip pain, needs outpatient follow up for this - has close f/up arranged with vascular surgery clinic.   CAD -Had intermittent chest pain chronically, went for [facility] for pre-op evaluation on 8/4 which showed mild non obstructive CAD with some diastolic dysfunction - lipitor 40mg  as above. Continue to treat  HTN to goal of <140/90 to help with her diastolic dysfunction, however she does not need any BP meds right now.   DM II - hgba1c 7.0. At goal.  - Harvey on SSI here as we held her home meds, resume home metformin and victoza on discharge - cont gabapentin and amitriptyline home dose to help with with her chronic pain. Not sure if this is helping much.   HTN - Home meds listed include: amlodipine 5mg  + lasix 20mg  + hctz 25mg  + lisinopril 10 + Edarbyclor 40-25mg . Likely not taking any meds at home because we held all meds and her BP remained in low 100's during the entire admission - watch without meds for now as her BP is running in low 100's. D/ced all of her BP meds on discharge. meds can be restarted with PCP follow up if needed.  Discharge Vitals:   BP 92/77 (BP Location: Left Arm)   Pulse 97   Temp 99 F (37.2 C) (Oral)   Resp 19   Ht 5\' 3"  (1.6 m)   Wt 195 lb 4.8 oz (88.6 kg)   LMP 03/21/2014   SpO2 95%   BMI 34.60 kg/m   Discharge Instructions: Discharge Instructions    Call MD for:  redness, tenderness, or signs of infection (pain, swelling, bleeding, redness, odor or green/yellow discharge around incision  site)    Complete by:  As directed   Call MD for:  severe or increased pain, loss or decreased feeling  in affected limb(s)    Complete by:  As directed   Call MD for:  temperature >100.5    Complete by:  As directed   Discharge instructions    Complete by:  As directed   It's very important for you to stop smoking and using other drugs such as cocaine in order to prevent any further worsening of your symptoms.  Please take Aspirin and plavix every day. Don't miss a dose.  Follow up with vascular surgery clinic and also with your regular doctor. Make appointment to see your primary care doctor as soon as possible. Don't take any blood pressure medication for now until you see your primary doctor.   Discharge wound care:    Complete by:  As directed   Wash the groin wound with soap and water daily and pat dry. (No tub bath-only shower)  Then put a dry gauze or washcloth there to keep this area dry daily and as needed.  Do not use Vaseline or neosporin on your incisions.  Only use soap and water on your incisions and then protect and keep dry.   Driving Restrictions    Complete by:  As directed   No driving for 2 weeks   Increase activity slowly    Complete by:  As directed   Lifting restrictions    Complete by:  As directed   No lifting for 4 weeks      Signed: Dellia Nims, MD 01/11/2016, 10:41 AM   Pager: [phone]

## 2016-01-11 NOTE — Progress Notes (Signed)
Pt education completed to include future appointments, current prescriptions and medications, and doctors discharge instructions. Pt alert and oriented, vital signs stable. Pt discharged with nursing staff via wheelchair.

## 2016-01-11 NOTE — Discharge Instructions (Signed)
Wash the groin wound with soap and water daily and pat dry. (No tub bath-only shower)  Then put a dry gauze or washcloth there to keep this area dry daily and as needed.  Do not use Vaseline or neosporin on your incisions.  Only use soap and water on your incisions and then protect and keep dry. ° °

## 2016-01-11 NOTE — Progress Notes (Addendum)
  Progress Note    01/11/2016 9:46 AM 2 Days Post-Op  Subjective:  Cried all night with pain  Tm 99 HR  90's-100's NSR [id number] systolic 0000000 RA  Vitals:   01/11/16 0500 01/11/16 0748  BP: 94/80 92/77  Pulse: 99 97  Resp: 16 19  Temp: 98.8 F (37.1 C) 99 F (37.2 C)    Physical Exam: Cardiac:  regular Lungs:  Non labored Incisions:  Left groin incision is clean and dry.  It is soft with no hematoma.  There is some mild bruising over the pubis.   Extremities:  Left foot is warm with easily palpable left DP pulse.  Abdomen:  Soft, NT/ND  CBC    Component Value Date/Time   WBC 7.2 01/11/2016 0815   RBC 3.53 (L) 01/11/2016 0815   HGB 10.4 (L) 01/11/2016 0815   HCT 32.1 (L) 01/11/2016 0815   PLT 284 01/11/2016 0815   MCV 90.9 01/11/2016 0815   MCH 29.5 01/11/2016 0815   MCHC 32.4 01/11/2016 0815   RDW 14.9 01/11/2016 0815   LYMPHSABS 1.8 01/08/2016 1059   MONOABS 0.4 01/08/2016 1059   EOSABS 0.1 01/08/2016 1059   BASOSABS 0.0 01/08/2016 1059    BMET    Component Value Date/Time   NA 137 01/11/2016 0815   K 4.1 01/11/2016 0815   CL 104 01/11/2016 0815   CO2 24 01/11/2016 0815   GLUCOSE 116 (H) 01/11/2016 0815   BUN 10 01/11/2016 0815   CREATININE 0.87 01/11/2016 0815   CALCIUM 9.0 01/11/2016 0815   GFRNONAA >60 01/11/2016 0815   GFRAA >60 01/11/2016 0815    INR    Component Value Date/Time   INR 0.93 01/09/2016 0513     Intake/Output Summary (Last 24 hours) at 01/11/16 0946 Last data filed at 01/10/16 1800  Gross per 24 hour  Intake              720 ml  Output                0 ml  Net              720 ml     Assessment:  50 y.o. female is s/p:  #1: Open Left common femoral artery exposure #2: Stent, left common iliac artery #3: Abdominal aortogram #4: First-order catheterization (right common iliac  artery) #5: Bilateral lower extremity runoff   2 Days Post-Op  Plan: -pt with pain around the left groin incision-incision is clean and dry and soft without hematoma.  Explained that she will have some soreness and pain medicine to help with it.  -pt with easily palpable left DP pulse -abdomen is soft and non tender -ok to discharge from vascular standpoint.  She states that [facility] and will need note for extra pillows, etc.   -will need to discharge on Plavix and Aspirin (Rx given for Plavix #30 3RF and Percocet #20NR) -our office will arrange f/u for pt with Dr. Trula Slade -discussed in detail with pt that she can shower with soap and water, otherwise, keep groin clean and dry by placing gauze or washcloth in groin to wick moisture to help prevent wound infection.   Leontine Locket, PA-C [facility] [phone] 01/11/2016 9:46 AM

## 2016-01-11 NOTE — Progress Notes (Signed)
   Subjective:   Patient having pain on her left groin, around the incision site. No fevers overnight, hgb stable. BP in high 90's. No pain on left foot.   Objective:  Vital signs in last 24 hours: Vitals:   01/10/16 2030 01/11/16 0042 01/11/16 0500 01/11/16 0748  BP: (!) 116/92 115/83 94/80 92/77   Pulse: 97 89 99 97  Resp: 20 19 16 19   Temp: 98.6 F (37 C) 98.8 F (37.1 C) 98.8 F (37.1 C) 99 F (37.2 C)  TempSrc: Oral Oral Oral Oral  SpO2: 96% 95% 95% 95%  Weight:      Height:       Vitals reviewed. General: resting in bed, NAD HEENT: artificial R eye, left eye pupil normal.  Cardiac: RRR, no rubs, murmurs or gallops Pulm: clear to auscultation bilaterally Abd: soft, nontender, nondistended, BS present. Left groin incision site has some tenderness to touch, no hematoma, discharge, or signs of infection, C/D/I.  Ext: 1+ pulses PT and DP bilaterally, warm to touch, no pain.  Neuro: alert and oriented X3  Assessment/Plan:  Principal Problem:   Iliac artery occlusion (HCC) Active Problems:   Diabetes ([place])   HTN (hypertension)   Hypothyroidism   PVD (peripheral vascular disease) (HCC)   Intermittent chest pain   HLD (hyperlipidemia)   Exertional angina (HCC)   Preoperative cardiovascular examination  Ischemic left foot s/p Left common iliac stent 8/4 with significant overall PVD Initial CTA showed Left iliac occlusion, also showed right SFA disease. S/p Left iliac stent placement 8/4 . Foot pain has resolved since the procedure. Still has left hip pain (was more lateral in the past, but today it's more in her groin where the incision site is). No hematoma or signs of infection - appreciate vascular surgery's assistance. Asked them to re-evaluate the patient's incision site but I think it looks ok. - started plavix daily. - cont increased dose of lipitor 40mg  daily - smoking cessation strongly encouraged, also cocaine cessation.  - cont percocet for hip pain, needs  outpatient follow up for this - will likely discharge today if vascular surgery is ok.   CAD -Had intermittent chest pain chronically, went for [facility] for pre-op evaluation on 8/4 which showed mild non obstructive CAD with some diastolic dysfunction - lipitor 40mg  as above. Continue to treat HTN to goal of <140/90 to help with her diastolic dysfunction.    DM II - hgba1c 7.0. At goal.  - cont SSI here, resume home metformin and victoza on discharge - cont gabapentin and amitriptyline home dose to help with with her chronic pain. Not sure if this is helping much.   HTN - Home meds listed include: amlodipine 5mg  + lasix 20mg  + hctz 25mg  + lisinopril 10 + Edarbyclor 40-25mg . Likely not taking any meds at home because we held all meds and her BP remained in low 100's during the entire admission - watch without meds for now as her BP is running in low 100's. May benefit from AceI or ARB resumption on discharge with her DM II  Dispo: Anticipated discharge in approximately today.   LOS: 3 days   Dellia Nims, MD 01/11/2016, 9:17 AM Pager: [phone]

## 2016-01-12 ENCOUNTER — Other Ambulatory Visit: Payer: Self-pay

## 2016-01-12 DIAGNOSIS — Z48812 Encounter for surgical aftercare following surgery on the circulatory system: Secondary | ICD-10-CM

## 2016-01-12 DIAGNOSIS — Z95828 Presence of other vascular implants and grafts: Secondary | ICD-10-CM

## 2016-01-13 ENCOUNTER — Encounter (HOSPITAL_COMMUNITY): Payer: Self-pay | Admitting: Surgery

## 2016-01-14 ENCOUNTER — Telehealth: Payer: Self-pay | Admitting: Surgery

## 2016-01-14 DIAGNOSIS — R200 Anesthesia of skin: Secondary | ICD-10-CM

## 2016-01-14 DIAGNOSIS — E0822 Diabetes mellitus due to underlying condition with diabetic chronic kidney disease: Secondary | ICD-10-CM

## 2016-01-14 DIAGNOSIS — I10 Essential (primary) hypertension: Secondary | ICD-10-CM

## 2016-01-14 DIAGNOSIS — R208 Other disturbances of skin sensation: Secondary | ICD-10-CM

## 2016-01-14 NOTE — Congregational Nurse Program (Signed)
Congregational Nurse Program Note  Date of Encounter: 01/14/2016  Past Medical History: Past Medical History:  Diagnosis Date  . Cancer of thyroid ([place])   . Diabetes mellitus without complication ([place])   . Hypertension   . Thyroid disease   . Vitreous floaters of left eye     Encounter Details:     CNP Questionnaire - 01/14/16 2355      Patient Demographics   Is this a new or existing patient? Existing   Patient is considered a/an Not Applicable   Race African-American/Black     Patient Assistance   Location of Patient Assistance Not Applicable   Patient's financial/insurance status Medicare;Medicaid   Uninsured Patient No   Patient referred to apply for the following financial assistance Not Applicable   Food insecurities addressed Not Applicable   Transportation assistance No   Assistance securing medications No   Educational health offerings Chronic disease;Diabetes;Hypertension     Encounter Details   Primary purpose of visit Family/Caregiver Support;Education/Health Concerns;Chronic Illness/Condition Visit;Spiritual Care/Support Visit   Was an Emergency Department visit averted? Not Applicable   Does patient have a medical provider? Yes   Patient referred to Follow up with established PCP   Was a mental health screening completed? (GAINS tool) No   Does patient have dental issues? No   Does patient have vision issues? Yes   Was a vision referral made? No   Does your patient have an abnormal blood pressure today? Yes   Since previous encounter, have you referred patient for abnormal blood pressure that resulted in a new diagnosis or medication change? No   Does your patient have an abnormal blood glucose today? No   Since previous encounter, have you referred patient for abnormal blood glucose that resulted in a new diagnosis or medication change? No   Was there a life-saving intervention made? No     Client in to see nurse after hospitalization this  Past week  end. States she was at her daughters and had a terrible pain in her leg and foot ,knew something was wrong. States a stent was placed in her groin area and the pain was really  Bad. 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 Client on aspirin and  Plavix. Client is moving out of shelter on tomorrow  ,encouraged to  Follow  Up  With her  MD's  And take her medication. Blood pressure today was 107.75  Pulse 114. Pulse  High . Client will need encouragement to  Take  Medications  Appropriately and get her refills . She tries to follow  Directions  But  Admits she get confused. She has the responsibility of  Raising her grandson and does a good  Job .

## 2016-01-14 NOTE — Telephone Encounter (Signed)
-----   Message from Denman George, RN sent at 01/12/2016  4:27 PM EDT ----- Regarding: needs 2 wk. f/u with ABI's and see VWB   ----- Message ----- From: Dario Ave Sent: 01/12/2016   1:30 PM To: [facility] Charge Pool Subject: Kay's log                                        ----- Message ----- From: Gabriel Earing, PA-C Sent: 01/11/2016   9:52 AM To: [facility] Charge Pool  S/p left iliac stent with cutdown.  F/u with Dr. Trula Slade in 2 weeks.  She will need ABI's.   Thanks, Aldona Bar

## 2016-01-14 NOTE — Telephone Encounter (Signed)
Sched appt 8/21; lab at 10:30 and MD at 11:00. Lm on hm# to inform pt.

## 2016-01-20 ENCOUNTER — Encounter: Payer: Self-pay | Admitting: Surgery

## 2016-01-26 ENCOUNTER — Encounter: Payer: Medicare Other | Admitting: Surgery

## 2016-01-26 ENCOUNTER — Ambulatory Visit (HOSPITAL_COMMUNITY): Payer: Medicare Other

## 2016-02-24 ENCOUNTER — Encounter: Payer: Self-pay | Admitting: Surgery

## 2016-03-01 ENCOUNTER — Ambulatory Visit (HOSPITAL_COMMUNITY)
Admission: RE | Admit: 2016-03-01 | Discharge: 2016-03-01 | Disposition: A | Discharge status: Home / Self Care | Payer: Medicare Other | Source: Ambulatory Visit | Attending: Surgery | Admitting: Surgery

## 2016-03-01 ENCOUNTER — Ambulatory Visit (INDEPENDENT_AMBULATORY_CARE_PROVIDER_SITE_OTHER): Payer: Medicare Other | Admitting: Surgery

## 2016-03-01 VITALS — BP 152/112 | HR 69 | Temp 97.6°F | Resp 18 | Ht 63.0 in | Wt 189.0 lb

## 2016-03-01 DIAGNOSIS — I10 Essential (primary) hypertension: Secondary | ICD-10-CM | POA: Insufficient documentation

## 2016-03-01 DIAGNOSIS — E119 Type 2 diabetes mellitus without complications: Secondary | ICD-10-CM | POA: Diagnosis not present

## 2016-03-01 DIAGNOSIS — I70212 Atherosclerosis of native arteries of extremities with intermittent claudication, left leg: Secondary | ICD-10-CM

## 2016-03-01 DIAGNOSIS — E785 Hyperlipidemia, unspecified: Secondary | ICD-10-CM | POA: Diagnosis not present

## 2016-03-01 DIAGNOSIS — R938 Abnormal findings on diagnostic imaging of other specified body structures: Secondary | ICD-10-CM | POA: Diagnosis not present

## 2016-03-01 DIAGNOSIS — Z48812 Encounter for surgical aftercare following surgery on the circulatory system: Secondary | ICD-10-CM | POA: Diagnosis not present

## 2016-03-01 DIAGNOSIS — Z95828 Presence of other vascular implants and grafts: Secondary | ICD-10-CM | POA: Insufficient documentation

## 2016-03-01 NOTE — Progress Notes (Signed)
Patient name: Jordan Harvey MRN: [id number] DOB: 07-01-1965 Sex: female  REASON FOR VISIT: post-op  HPI: Jordan Harvey is a 50 y.o. female returns today for follow-up.  On 01/08/2016 she presented to the emergency department with complaints of left leg numbness for approximately one week.  She was having difficulty walking because she could not bear weight on her left foot.  This was secondary to numbness and tingling.  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  She was started on heparin.  She underwent cardiac clearance via catheterization and then on 01/09/2016 she was taken to the operating room and underwent left common femoral artery exposure and retrograde stenting after recanalization of her occluded iliac.  A GORE VBX 9 x 59 stent was placed.  The patient had some pain issues around her incision postoperatively.  She was ultimately able to be discharged home on aspirin and Plavix.  The patient reports that she ran out of her Plavix and has not had a refilled yet.  She is not taking an aspirin.  She states that her left leg feels perfect and that there are no problems.  Current Outpatient Prescriptions  Medication Sig Dispense Refill  . amitriptyline (ELAVIL) 25 MG tablet Take 25 mg by mouth at bedtime. Reported on 07/19/2015    . aspirin EC 81 MG EC tablet Take 1 tablet (81 mg total) by mouth daily. 30 tablet 0  . atorvastatin (LIPITOR) 20 MG tablet Take 2 tablets (40 mg total) by mouth daily. Reported on 12/11/2015 30 tablet 0  . busPIRone (BUSPAR) 15 MG tablet TAKE 1 TABLET BY MOUTH 2 TIMES DAILY AS NEEDED FOR ANXIETY  1  . clopidogrel (PLAVIX) 75 MG tablet Take 1 tablet (75 mg total) by mouth daily. 30 tablet 3  . cyclobenzaprine (FLEXERIL) 10 MG tablet TAKE 1 TABLET BY MOUTH 2 TIMES DAILY AS NEEDED  1  . DUREZOL 0.05 % EMUL Place 2 drops into the left eye daily.  1  . EDARBYCLOR 40-25 MG TABS TAKE 1 TABLET BY MOUTH EVERY DAY FOR BLOOD  PRESSURE  3  . escitalopram (LEXAPRO) 20 MG tablet Take 1 tablet by mouth daily for anxiety and depression  3  . furosemide (LASIX) 20 MG tablet Take 20 mg by mouth daily.  3  . gabapentin (NEURONTIN) 600 MG tablet Take 600 mg by mouth 2 (two) times daily.  5  . levocetirizine (XYZAL) 5 MG tablet Take 1 tablet by mouth daily as needed for allergies  3  . levothyroxine (SYNTHROID, LEVOTHROID) 125 MCG tablet Take 125 mcg by mouth daily before breakfast.     . Liraglutide (VICTOZA) 18 MG/3ML SOPN Inject 0.6 mg into the skin daily.    Marland Kitchen losartan (COZAAR) 100 MG tablet TAKE 1 TABLET BY MOUTH EVERY DAY FOR BLOOD PRESSURE  3  . meloxicam (MOBIC) 15 MG tablet TAKE 1 TABLET BY MOUTH EVERY DAY AS NEEDED WITH FOOD  1  . metFORMIN (GLUCOPHAGE) 500 MG tablet Take 500 mg by mouth daily with breakfast. Reported on 12/11/2015    . nicotine (NICODERM CQ - DOSED IN MG/24 HR) 7 mg/24hr patch Place 1 patch (7 mg total) onto the skin daily. 28 patch 0  . nicotine polacrilex (NICORETTE) 2 MG gum Take 1 each (2 mg total) by mouth as needed for smoking cessation. 100 tablet 0  . oxyCODONE-acetaminophen (PERCOCET) 7.5-325 MG tablet Take 1 tablet by mouth 3 (three) times daily as needed for moderate pain. 20 tablet  0   No current facility-administered medications for this visit.     REVIEW OF SYSTEMS:  [X]  denotes positive finding, [ ]  denotes negative finding Cardiac  Comments:  Chest pain or chest pressure:    Shortness of breath upon exertion:    Short of breath when lying flat:    Irregular heart rhythm:    Constitutional    Fever or chills:      PHYSICAL EXAM: Vitals:   03/01/16 1212 03/01/16 1215  BP: (!) 154/104 (!) 152/112  Pulse: 69 69  Resp: 18   Temp: 97.6 F (36.4 C)   TempSrc: Oral   SpO2: 100%   Weight: 189 lb (85.7 kg)   Height: 5\' 3"  (1.6 m)     GENERAL: The patient is a well-nourished female, in no acute distress. The vital signs are documented above. CARDIOVASCULAR: There is a  regular rate and rhythm. PULMONARY: There is good air exchange bilaterally without wheezing or rales. I cannot palpate her pedal pulses.  Her incision has healed nicely.  Duplex ultrasound today shows a normal ankle-brachial index on the left with triphasic waveforms.  On the right ABI 0.88 with triphasic waveforms  MEDICAL ISSUES: Status post recanalization of an acutely occluded left iliac artery via open common femoral artery exposure and covered stent placement.  The patient's symptoms have resolved.  Unfortunately she has not been able to take her blood thinners and does not take an aspirin.  I have stressed the importance of taking her Plavix and aspirin.  She promised that she would make sure this happens.  I have her scheduled for follow-up in 6 months with a duplex  [name], MD [facility] [phone] Pager [phone]

## 2016-03-15 ENCOUNTER — Other Ambulatory Visit (HOSPITAL_COMMUNITY): Payer: Self-pay | Admitting: Anesthesiology

## 2016-03-30 ENCOUNTER — Emergency Department (HOSPITAL_COMMUNITY)
Admission: EM | Admit: 2016-03-30 | Discharge: 2016-03-30 | Disposition: A | Discharge status: Home / Self Care | Payer: Medicare Other

## 2016-04-26 ENCOUNTER — Encounter (HOSPITAL_COMMUNITY): Payer: Self-pay

## 2016-04-26 ENCOUNTER — Emergency Department (HOSPITAL_COMMUNITY)
Admission: EM | Admit: 2016-04-26 | Discharge: 2016-04-27 | Disposition: A | Discharge status: Home / Self Care | Payer: Medicare Other | Attending: Emergency Medicine | Admitting: Emergency Medicine

## 2016-04-26 DIAGNOSIS — F17210 Nicotine dependence, cigarettes, uncomplicated: Secondary | ICD-10-CM | POA: Insufficient documentation

## 2016-04-26 DIAGNOSIS — Z9114 Patient's other noncompliance with medication regimen: Secondary | ICD-10-CM | POA: Diagnosis not present

## 2016-04-26 DIAGNOSIS — R51 Headache: Secondary | ICD-10-CM | POA: Diagnosis not present

## 2016-04-26 DIAGNOSIS — I10 Essential (primary) hypertension: Secondary | ICD-10-CM | POA: Diagnosis not present

## 2016-04-26 DIAGNOSIS — Z7982 Long term (current) use of aspirin: Secondary | ICD-10-CM | POA: Diagnosis not present

## 2016-04-26 DIAGNOSIS — R109 Unspecified abdominal pain: Secondary | ICD-10-CM | POA: Insufficient documentation

## 2016-04-26 DIAGNOSIS — E119 Type 2 diabetes mellitus without complications: Secondary | ICD-10-CM | POA: Diagnosis not present

## 2016-04-26 DIAGNOSIS — R519 Headache, unspecified: Secondary | ICD-10-CM

## 2016-04-26 DIAGNOSIS — Z7984 Long term (current) use of oral hypoglycemic drugs: Secondary | ICD-10-CM | POA: Insufficient documentation

## 2016-04-26 DIAGNOSIS — Z85850 Personal history of malignant neoplasm of thyroid: Secondary | ICD-10-CM | POA: Insufficient documentation

## 2016-04-26 DIAGNOSIS — Z79899 Other long term (current) drug therapy: Secondary | ICD-10-CM | POA: Diagnosis not present

## 2016-04-26 DIAGNOSIS — R079 Chest pain, unspecified: Secondary | ICD-10-CM | POA: Diagnosis present

## 2016-04-26 DIAGNOSIS — R0789 Other chest pain: Secondary | ICD-10-CM | POA: Insufficient documentation

## 2016-04-26 LAB — URINE MICROSCOPIC-ADD ON

## 2016-04-26 LAB — COMPREHENSIVE METABOLIC PANEL
ALBUMIN: 4.2 g/dL (ref 3.5–5.0)
ALT: 17 U/L (ref 14–54)
ANION GAP: 7 (ref 5–15)
AST: 21 U/L (ref 15–41)
Alkaline Phosphatase: 72 U/L (ref 38–126)
BILIRUBIN TOTAL: 0.3 mg/dL (ref 0.3–1.2)
BUN: 13 mg/dL (ref 6–20)
CO2: 22 mmol/L (ref 22–32)
Calcium: 9.3 mg/dL (ref 8.9–10.3)
Chloride: 110 mmol/L (ref 101–111)
Creatinine, Ser: 1.15 mg/dL — ABNORMAL HIGH (ref 0.44–1.00)
GFR calc Af Amer: >60 mL/min (ref >60)
GFR calc non Af Amer: 55 mL/min — ABNORMAL LOW (ref >60)
GLUCOSE: 92 mg/dL (ref 65–99)
POTASSIUM: 3.8 mmol/L (ref 3.5–5.1)
Sodium: 139 mmol/L (ref 135–145)
TOTAL PROTEIN: 7.5 g/dL (ref 6.5–8.1)

## 2016-04-26 LAB — URINALYSIS, ROUTINE W REFLEX MICROSCOPIC
Bilirubin Urine: NEGATIVE
Glucose, UA: NEGATIVE mg/dL
Ketones, ur: NEGATIVE mg/dL
LEUKOCYTES UA: NEGATIVE
NITRITE: NEGATIVE
PH: 6 (ref 5.0–8.0)
Protein, ur: NEGATIVE mg/dL
SPECIFIC GRAVITY, URINE: 1.017 (ref 1.005–1.030)

## 2016-04-26 LAB — CBC
HEMATOCRIT: 37.3 % (ref 36.0–46.0)
HEMOGLOBIN: 12.4 g/dL (ref 12.0–15.0)
MCH: 30.5 pg (ref 26.0–34.0)
MCHC: 33.2 g/dL (ref 30.0–36.0)
MCV: 91.6 fL (ref 78.0–100.0)
Platelets: 346 10*3/uL (ref 150–400)
RBC: 4.07 MIL/uL (ref 3.87–5.11)
RDW: 16.5 % — ABNORMAL HIGH (ref 11.5–15.5)
WBC: 7.7 10*3/uL (ref 4.0–10.5)

## 2016-04-26 LAB — CBG MONITORING, ED: GLUCOSE-CAPILLARY: 92 mg/dL (ref 65–99)

## 2016-04-26 LAB — LIPASE, BLOOD: Lipase: 68 U/L — ABNORMAL HIGH (ref 11–51)

## 2016-04-26 MED ORDER — ONDANSETRON HCL 4 MG/2ML IJ SOLN
4.0000 mg | Freq: Once | INTRAMUSCULAR | Status: AC
  Administered 2016-04-27: 4 mg via INTRAVENOUS
  Filled 2016-04-26: qty 2

## 2016-04-26 MED ORDER — LOSARTAN POTASSIUM 50 MG PO TABS
100.0000 mg | ORAL_TABLET | Freq: Once | ORAL | Status: AC
  Administered 2016-04-27: 100 mg via ORAL
  Filled 2016-04-26: qty 2

## 2016-04-26 MED ORDER — FUROSEMIDE 20 MG PO TABS
20.0000 mg | ORAL_TABLET | Freq: Once | ORAL | Status: AC
  Administered 2016-04-27: 20 mg via ORAL
  Filled 2016-04-26: qty 1

## 2016-04-26 MED ORDER — DICYCLOMINE HCL 10 MG/ML IM SOLN
20.0000 mg | Freq: Once | INTRAMUSCULAR | Status: AC
  Administered 2016-04-27: 20 mg via INTRAMUSCULAR
  Filled 2016-04-26: qty 2

## 2016-04-26 MED ORDER — KETOROLAC TROMETHAMINE 30 MG/ML IJ SOLN
30.0000 mg | Freq: Once | INTRAMUSCULAR | Status: AC
  Administered 2016-04-27: 30 mg via INTRAVENOUS
  Filled 2016-04-26: qty 1

## 2016-04-26 NOTE — ED Provider Notes (Addendum)
By signing my name below, I, Doran Stabler, attest that this documentation has been prepared under the direction and in the presence of [employer], DO. Electronically Signed: Doran Stabler, ED Scribe. 04/26/16. 11:41 PM.  TIME SEEN: 11:41 PM.  CHIEF COMPLAINT:  Chief Complaint  Patient presents with  . Abdominal Pain   HPI: HPI Comments: Jordan Harvey is a 50 y.o. female who presents to the Emergency Department with a PMHx of HTN and DM complaining of Multiple different complaints.   First patient is complaining of right sided abdominal cramping that began one month ago. Reports associated nausea but no vomiting or diarrhea. No dysuria or hematuria. No vaginal bleeding or discharge.  Patient is also complaining of diffuse throbbing headache. Headache is been present for 2 weeks.  Pt is states she thinks she is on a "blood thinner". Appears she is on Plavix per her records. Pt denies any head injuries. Has not taken anything for her headache at home. Has had one month of tingling in bilateral fingertips and bilateral toes. No other numbness or focal weakness.  Patient also complains of sharp chest pain that she had 2 days ago that has now all. No shortness of breath. No cough.  She states she started Victoza 6 months ago and each time she takes it, she feels "weak." She has been checking her blood sugar levels which have been in the 90s. It appears she is on metformin. It is unclear if she has been taking it. She has not been taking her Victoza. Blood sugar today is 92. She is not on insulin.  Pt denies any CP, SOB, diarrhea, dysuria, hematuria, vaginal bleeding or any other symptoms at this time. NKDA  Pt has been noncompliant with her BP medication for about 1 week. States that she does not "trust" her primary care physician.     ROS: See HPI Constitutional: no fever  Eyes: no drainage  ENT: no runny nose   Cardiovascular:  No chest pain  Resp: no SOB  GI: no  vomiting GU: no dysuria Integumentary: no rash  Allergy: no hives  Musculoskeletal: no leg swelling  Neurological: no slurred speech ROS otherwise negative  PAST MEDICAL HISTORY/PAST SURGICAL HISTORY:  Past Medical History:  Diagnosis Date  . Cancer of thyroid ([place])   . Diabetes mellitus without complication ([place])   . Hypertension   . Thyroid disease   . Vitreous floaters of left eye     MEDICATIONS:  Prior to Admission medications   Medication Sig Start Date End Date Taking? Authorizing Provider  amitriptyline (ELAVIL) 25 MG tablet Take 25 mg by mouth at bedtime. Reported on 07/19/2015    Historical Provider, MD  aspirin EC 81 MG EC tablet Take 1 tablet (81 mg total) by mouth daily. 01/11/16   Dellia Nims, MD  atorvastatin (LIPITOR) 20 MG tablet Take 2 tablets (40 mg total) by mouth daily. Reported on 12/11/2015 01/11/16   Dellia Nims, MD  busPIRone (BUSPAR) 15 MG tablet TAKE 1 TABLET BY MOUTH 2 TIMES DAILY AS NEEDED FOR ANXIETY 02/06/16   Historical Provider, MD  clopidogrel (PLAVIX) 75 MG tablet Take 1 tablet (75 mg total) by mouth daily. 01/11/16   Samantha J Rhyne, PA-C  cyclobenzaprine (FLEXERIL) 10 MG tablet TAKE 1 TABLET BY MOUTH 2 TIMES DAILY AS NEEDED 12/03/15   Historical Provider, MD  DUREZOL 0.05 % EMUL Place 2 drops into the left eye daily. 11/11/15   Historical Provider, MD  EDARBYCLOR 40-25 MG TABS  TAKE 1 TABLET BY MOUTH EVERY DAY FOR BLOOD PRESSURE 02/06/16   Historical Provider, MD  escitalopram (LEXAPRO) 20 MG tablet Take 1 tablet by mouth daily for anxiety and depression 02/06/16   Historical Provider, MD  furosemide (LASIX) 20 MG tablet Take 20 mg by mouth daily. 01/01/16   Historical Provider, MD  gabapentin (NEURONTIN) 600 MG tablet Take 600 mg by mouth 2 (two) times daily. 10/31/15   Historical Provider, MD  levocetirizine (XYZAL) 5 MG tablet Take 1 tablet by mouth daily as needed for allergies 02/06/16   Historical Provider, MD  levothyroxine (SYNTHROID, LEVOTHROID) 125 MCG  tablet Take 125 mcg by mouth daily before breakfast.  07/08/15   Historical Provider, MD  Liraglutide (VICTOZA) 18 MG/3ML SOPN Inject 0.6 mg into the skin daily.    Historical Provider, MD  losartan (COZAAR) 100 MG tablet TAKE 1 TABLET BY MOUTH EVERY DAY FOR BLOOD PRESSURE 02/06/16   Historical Provider, MD  meloxicam (MOBIC) 15 MG tablet TAKE 1 TABLET BY MOUTH EVERY DAY AS NEEDED WITH FOOD 12/15/15   Historical Provider, MD  metFORMIN (GLUCOPHAGE) 500 MG tablet Take 500 mg by mouth daily with breakfast. Reported on 12/11/2015    Historical Provider, MD  nicotine (NICODERM CQ - DOSED IN MG/24 HR) 7 mg/24hr patch Place 1 patch (7 mg total) onto the skin daily. 01/11/16   Dellia Nims, MD  nicotine polacrilex (NICORETTE) 2 MG gum Take 1 each (2 mg total) by mouth as needed for smoking cessation. 01/11/16   Dellia Nims, MD  oxyCODONE-acetaminophen (PERCOCET) 7.5-325 MG tablet Take 1 tablet by mouth 3 (three) times daily as needed for moderate pain. 01/11/16   Gabriel Earing, PA-C    ALLERGIES:  Allergies  Allergen Reactions  . Bee Venom Shortness Of Breath    SOCIAL HISTORY:  Social History  Substance Use Topics  . Smoking status: Light Tobacco Smoker    Packs/day: 0.01    Types: Cigarettes  . Smokeless tobacco: Never Used  . Alcohol use No    FAMILY HISTORY: Family History  Problem Relation Age of Onset  . Breast cancer Mother   . CAD Sister 20    MI    EXAM: BP (!) 200/127   Pulse 70   Temp 98.2 F (36.8 C) (Oral)   Resp 16   LMP 03/21/2014   SpO2 99%  CONSTITUTIONAL: Alert and oriented and responds appropriately to questions. Well-appearing; well-nourished HEAD: Normocephalic EYES: Conjunctivae clear, PERRL, EOMI, Strabismus ENT: normal nose; no rhinorrhea; moist mucous membranes NECK: Supple, no meningismus, no nuchal rigidity, no LAD  CARD: RRR; S1 and S2 appreciated; no murmurs, no clicks, no rubs, no gallops RESP: Normal chest excursion without splinting or tachypnea;  breath sounds clear and equal bilaterally; no wheezes, no rhonchi, no rales, no hypoxia or respiratory distress, speaking full sentences ABD/GI: Normal bowel sounds; non-distended; soft, TTP of the right abdomen, no rebound, no guarding, no peritoneal signs, no hepatosplenomegaly BACK:  The back appears normal and is non-tender to palpation, there is no CVA tenderness EXT: Normal ROM in all joints; non-tender to palpation; no edema; normal capillary refill; no cyanosis, no calf tenderness or swelling    SKIN: Normal color for age and race; warm; no rash NEURO: Moves all extremities equally, sensation to light touch intact diffusely, cranial nerves II through XII intact, normal speech PSYCH: The patient's mood and manner are appropriate. Grooming and personal hygiene are appropriate.  MEDICAL DECISION MAKING: Patient here with multiple different complaints. Complaining  of headache, chest pain 2 days ago, right-sided abdominal pain, feeling weak and dizzy after taking her Victoza. It appears she is medically noncompliant. Blood pressure here is 200/127. She does not appear to be in any significant distress. Given she is complaining of headache and is hypertensive, will obtain a CT of her head. She is neurologically intact. Unclear if she has been taking her Plavix. Patient also chest pain but this was 2 days ago. EKG shows no new ischemic abnormality. Troponin and chest x-ray pending. Patient also complaining of right-sided abdominal pain and is mildly tender to palpation on exam. Will obtain CT of her abdomen and pelvis for further evaluation. Abdominal labs, urine also pending. Will treat symptoms with Toradol, Zofran. We'll give her her home blood pressure medications.  ED PROGRESS: 1:25 AM  Patient's labs are unremarkable other than mild elevation of her creatinine and mild elevation of her lipase. LFTs normal. No leukocytosis. Troponin negative. Urine shows small hemoglobin but no protein. CT of her  head and abdomen and pelvis show no acute abnormality. Chest x-ray clear. We'll reassess patient after she has received her medications.  3:25 AM  Pt is now completely asymptomatic. Blood pressure is 1300/75. Will discharge home with prescriptions for Fioricet, Zofran, Bentyl. We will refill her Cozaar, furosemide, metformin, Plavix, levothyroxine, Cozaar and chlorthalidone. It does appear that she is on another combination ARB. She is not clear what medication she should be taking so I have recommended close follow-up with her PCP Dr. Ouida Sills this week which she agrees to. Discussed strict return cautioned with patient. She verbalizes understanding and is comfortable with this plan. She can have her other medications such as BuSpar, Elavil, gabapentin refilled by her primary care physician if needed.    At this time, I do not feel there is any life-threatening condition present. I have reviewed and discussed all results (EKG, imaging, lab, urine as appropriate) and exam findings with patient/family. I have reviewed nursing notes and appropriate previous records.  I feel the patient is safe to be discharged home without further emergent workup and can continue workup as an outpatient as needed. Discussed usual and customary return precautions. Patient/family verbalize understanding and are comfortable with this plan.  Outpatient follow-up has been provided. All questions have been answered.    EKG Interpretation  Date/Time:  Tuesday April 27 2016 01:20:01 EST Ventricular Rate:  60 PR Interval:    QRS Duration: 111 QT Interval:  441 QTC Calculation: 441 R Axis:   -58 Text Interpretation:  Sinus rhythm Ventricular premature complex Prolonged PR interval Left anterior fascicular block Abnormal R-wave progression, late transition Nonspecific T abnormalities, lateral leads that are new compared to prior Confirmed by WARD,  DO, KRISTEN [id number]) on 04/27/2016 1:23:32 AM        I personally  performed the services described in this documentation, which was scribed in my presence. The recorded information has been reviewed and is accurate.      [place], DO 04/27/16 [place], DO 04/27/16 [id number]

## 2016-04-26 NOTE — ED Triage Notes (Signed)
CBG = 92 at triage.

## 2016-04-26 NOTE — ED Triage Notes (Signed)
Pt complaining of headache and R sided abdominal pain. Pt states newly started insulin, states feels weak when takes it. Pt has not had any insulin tonight.

## 2016-04-27 ENCOUNTER — Emergency Department (HOSPITAL_COMMUNITY): Payer: Medicare Other

## 2016-04-27 DIAGNOSIS — R0789 Other chest pain: Secondary | ICD-10-CM | POA: Diagnosis not present

## 2016-04-27 LAB — PROTIME-INR
INR: 1.03
Prothrombin Time: 13.5 seconds (ref 11.4–15.2)

## 2016-04-27 LAB — RAPID URINE DRUG SCREEN, HOSP PERFORMED
Amphetamines: NOT DETECTED
BARBITURATES: NOT DETECTED
Benzodiazepines: NOT DETECTED
COCAINE: NOT DETECTED
Opiates: NOT DETECTED
TETRAHYDROCANNABINOL: POSITIVE — AB

## 2016-04-27 LAB — I-STAT TROPONIN, ED: TROPONIN I, POC: 0 ng/mL (ref 0.00–0.08)

## 2016-04-27 LAB — DIFFERENTIAL
Basophils Absolute: 0 10*3/uL (ref 0.0–0.1)
Basophils Relative: 1 %
EOS PCT: 1 %
Eosinophils Absolute: 0.1 10*3/uL (ref 0.0–0.7)
LYMPHS ABS: 3.1 10*3/uL (ref 0.7–4.0)
LYMPHS PCT: 40 %
Monocytes Absolute: 0.3 10*3/uL (ref 0.1–1.0)
Monocytes Relative: 4 %
NEUTROS PCT: 54 %
Neutro Abs: 4.2 10*3/uL (ref 1.7–7.7)

## 2016-04-27 MED ORDER — DICYCLOMINE HCL 20 MG PO TABS: 20.0000 mg | ORAL_TABLET | Freq: Three times a day (TID) | ORAL | 0 refills | Status: DC

## 2016-04-27 MED ORDER — CHLORTHALIDONE 25 MG PO TABS
25.0000 mg | ORAL_TABLET | Freq: Once | ORAL | Status: AC
  Administered 2016-04-27: 25 mg via ORAL
  Filled 2016-04-27: qty 1

## 2016-04-27 MED ORDER — IOPAMIDOL (ISOVUE-300) INJECTION 61%
INTRAVENOUS | Status: AC
  Administered 2016-04-27: 100 mL
  Filled 2016-04-27: qty 100

## 2016-04-27 MED ORDER — LOSARTAN POTASSIUM 100 MG PO TABS: 100.0000 mg | ORAL_TABLET | Freq: Every day | ORAL | 1 refills | Status: DC

## 2016-04-27 MED ORDER — ATORVASTATIN CALCIUM 20 MG PO TABS: 40.0000 mg | ORAL_TABLET | Freq: Every day | ORAL | 1 refills | Status: AC

## 2016-04-27 MED ORDER — LIRAGLUTIDE 18 MG/3ML ~~LOC~~ SOPN: 0.6000 mg | PEN_INJECTOR | Freq: Every day | SUBCUTANEOUS | 1 refills | Status: DC

## 2016-04-27 MED ORDER — HYDRALAZINE HCL 20 MG/ML IJ SOLN: 10.0000 mg | Freq: Once | INTRAMUSCULAR | Status: DC

## 2016-04-27 MED ORDER — LEVOTHYROXINE SODIUM 125 MCG PO TABS: 125.0000 ug | ORAL_TABLET | Freq: Every day | ORAL | 1 refills | Status: DC

## 2016-04-27 MED ORDER — AMLODIPINE BESYLATE 5 MG PO TABS: 10.0000 mg | ORAL_TABLET | Freq: Once | ORAL | Status: DC

## 2016-04-27 MED ORDER — BUTALBITAL-APAP-CAFFEINE 50-325-40 MG PO TABS: 1.0000 | ORAL_TABLET | Freq: Four times a day (QID) | ORAL | 0 refills | Status: AC | PRN

## 2016-04-27 MED ORDER — CLOPIDOGREL BISULFATE 75 MG PO TABS: 75.0000 mg | ORAL_TABLET | Freq: Every day | ORAL | 1 refills | Status: DC

## 2016-04-27 MED ORDER — CHLORTHALIDONE 25 MG PO TABS: 25.0000 mg | ORAL_TABLET | Freq: Every day | ORAL | 1 refills | Status: DC

## 2016-04-27 MED ORDER — FUROSEMIDE 20 MG PO TABS: 20.0000 mg | ORAL_TABLET | Freq: Every day | ORAL | 1 refills | Status: DC

## 2016-04-27 MED ORDER — ONDANSETRON 4 MG PO TBDP: 4.0000 mg | ORAL_TABLET | Freq: Four times a day (QID) | ORAL | 0 refills | Status: DC | PRN

## 2016-04-27 MED ORDER — METFORMIN HCL 500 MG PO TABS: 500.0000 mg | ORAL_TABLET | Freq: Every day | ORAL | 1 refills | Status: DC

## 2016-04-27 NOTE — Discharge Instructions (Signed)
Please take your medications as prescribed. We have refilled your medications for your blood pressure, diabetes, high cholesterol, blood thinner for your peripheral arterial disease and your thyroid dysfunction. You have other medications listed that we have not refilled today that can be refilled by her primary care physician. I recommend very close follow-up with your primary care provider to insure that you're taking the right medications. Please make sure when you go home that you are not taking duplicates of any medications. I suspect that your elevated blood pressure is the cause of many of your symptoms. Your workup today was unremarkable but again I recommend very close follow-up with your PCP.

## 2016-04-27 NOTE — ED Notes (Signed)
Gave pt meal bag and soda.

## 2016-05-05 NOTE — Addendum Note (Signed)
Addended by: Lianne Cure A on: 05/05/2016 02:27 PM   Modules accepted: Orders

## 2016-06-10 ENCOUNTER — Other Ambulatory Visit: Payer: Self-pay | Admitting: Internal Medicine

## 2016-09-01 ENCOUNTER — Encounter: Payer: Self-pay | Admitting: Family

## 2016-09-13 ENCOUNTER — Ambulatory Visit: Payer: Medicare Other | Admitting: Family

## 2016-09-13 ENCOUNTER — Encounter (HOSPITAL_COMMUNITY): Payer: Medicare Other

## 2016-11-10 ENCOUNTER — Encounter (HOSPITAL_COMMUNITY): Payer: Self-pay | Admitting: *Deleted

## 2016-11-10 ENCOUNTER — Emergency Department (HOSPITAL_COMMUNITY)
Admission: EM | Admit: 2016-11-10 | Discharge: 2016-11-10 | Disposition: A | Discharge status: Home / Self Care | Payer: Medicare Other | Attending: Emergency Medicine | Admitting: Emergency Medicine

## 2016-11-10 DIAGNOSIS — Z7982 Long term (current) use of aspirin: Secondary | ICD-10-CM | POA: Insufficient documentation

## 2016-11-10 DIAGNOSIS — G44219 Episodic tension-type headache, not intractable: Secondary | ICD-10-CM | POA: Diagnosis not present

## 2016-11-10 DIAGNOSIS — E119 Type 2 diabetes mellitus without complications: Secondary | ICD-10-CM | POA: Diagnosis not present

## 2016-11-10 DIAGNOSIS — E039 Hypothyroidism, unspecified: Secondary | ICD-10-CM | POA: Diagnosis not present

## 2016-11-10 DIAGNOSIS — F17210 Nicotine dependence, cigarettes, uncomplicated: Secondary | ICD-10-CM | POA: Diagnosis not present

## 2016-11-10 DIAGNOSIS — Z85850 Personal history of malignant neoplasm of thyroid: Secondary | ICD-10-CM | POA: Diagnosis not present

## 2016-11-10 DIAGNOSIS — Z79899 Other long term (current) drug therapy: Secondary | ICD-10-CM | POA: Insufficient documentation

## 2016-11-10 DIAGNOSIS — E860 Dehydration: Secondary | ICD-10-CM | POA: Diagnosis not present

## 2016-11-10 DIAGNOSIS — R109 Unspecified abdominal pain: Secondary | ICD-10-CM | POA: Diagnosis present

## 2016-11-10 DIAGNOSIS — R112 Nausea with vomiting, unspecified: Secondary | ICD-10-CM | POA: Diagnosis not present

## 2016-11-10 DIAGNOSIS — Z7901 Long term (current) use of anticoagulants: Secondary | ICD-10-CM | POA: Insufficient documentation

## 2016-11-10 DIAGNOSIS — I10 Essential (primary) hypertension: Secondary | ICD-10-CM | POA: Diagnosis not present

## 2016-11-10 DIAGNOSIS — R197 Diarrhea, unspecified: Secondary | ICD-10-CM | POA: Insufficient documentation

## 2016-11-10 HISTORY — DX: Acute embolism and thrombosis of unspecified deep veins of unspecified lower extremity: I82.409

## 2016-11-10 LAB — URINALYSIS, ROUTINE W REFLEX MICROSCOPIC
Bilirubin Urine: NEGATIVE
GLUCOSE, UA: NEGATIVE mg/dL
HGB URINE DIPSTICK: NEGATIVE
KETONES UR: NEGATIVE mg/dL
Leukocytes, UA: NEGATIVE
Nitrite: NEGATIVE
PROTEIN: NEGATIVE mg/dL
Specific Gravity, Urine: 1.027 (ref 1.005–1.030)
pH: 5 (ref 5.0–8.0)

## 2016-11-10 LAB — COMPREHENSIVE METABOLIC PANEL
ALK PHOS: 77 U/L (ref 38–126)
ALT: 19 U/L (ref 14–54)
AST: 22 U/L (ref 15–41)
Albumin: 3.8 g/dL (ref 3.5–5.0)
Anion gap: 8 (ref 5–15)
BUN: 12 mg/dL (ref 6–20)
CALCIUM: 9.2 mg/dL (ref 8.9–10.3)
CHLORIDE: 106 mmol/L (ref 101–111)
CO2: 26 mmol/L (ref 22–32)
CREATININE: 0.97 mg/dL (ref 0.44–1.00)
Glucose, Bld: 116 mg/dL — ABNORMAL HIGH (ref 65–99)
Potassium: 3.1 mmol/L — ABNORMAL LOW (ref 3.5–5.1)
Sodium: 140 mmol/L (ref 135–145)
Total Bilirubin: 0.6 mg/dL (ref 0.3–1.2)
Total Protein: 7.1 g/dL (ref 6.5–8.1)

## 2016-11-10 LAB — LIPASE, BLOOD: LIPASE: 55 U/L — AB (ref 11–51)

## 2016-11-10 LAB — CBC
HCT: 38.7 % (ref 36.0–46.0)
Hemoglobin: 12.5 g/dL (ref 12.0–15.0)
MCH: 29.4 pg (ref 26.0–34.0)
MCHC: 32.3 g/dL (ref 30.0–36.0)
MCV: 91.1 fL (ref 78.0–100.0)
PLATELETS: 270 10*3/uL (ref 150–400)
RBC: 4.25 MIL/uL (ref 3.87–5.11)
RDW: 15 % (ref 11.5–15.5)
WBC: 7.5 10*3/uL (ref 4.0–10.5)

## 2016-11-10 LAB — CBG MONITORING, ED: Glucose-Capillary: 128 mg/dL — ABNORMAL HIGH (ref 65–99)

## 2016-11-10 MED ORDER — ONDANSETRON 4 MG PO TBDP: 4.0000 mg | ORAL_TABLET | Freq: Three times a day (TID) | ORAL | 0 refills | Status: AC | PRN

## 2016-11-10 MED ORDER — SODIUM CHLORIDE 0.9 % IV BOLUS (SEPSIS)
1000.0000 mL | Freq: Once | INTRAVENOUS | Status: AC
  Administered 2016-11-10: 1000 mL via INTRAVENOUS

## 2016-11-10 MED ORDER — ONDANSETRON 4 MG PO TBDP
4.0000 mg | ORAL_TABLET | Freq: Once | ORAL | Status: AC | PRN
  Administered 2016-11-10: 4 mg via ORAL

## 2016-11-10 MED ORDER — KETOROLAC TROMETHAMINE 15 MG/ML IJ SOLN
15.0000 mg | Freq: Once | INTRAMUSCULAR | Status: AC
  Administered 2016-11-10: 15 mg via INTRAVENOUS
  Filled 2016-11-10: qty 1

## 2016-11-10 MED ORDER — ONDANSETRON 4 MG PO TBDP
ORAL_TABLET | ORAL | Status: AC
  Filled 2016-11-10: qty 1

## 2016-11-10 NOTE — ED Provider Notes (Signed)
[facility] DEPT Provider Note   CSN: [id number] Arrival date & time: 11/10/16  1128     History   Chief Complaint Chief Complaint  Patient presents with  . Headache    HPI Jordan Harvey is a 51 y.o. female.  The history is provided by the patient.  Abdominal Cramping  This is a new problem. Episode onset: 4 days. The problem occurs daily. Progression since onset: intermittent. Associated symptoms include headaches (headache began 2 days ago; intermittent in nature. Associated with emesis.). Pertinent negatives include no chest pain and no shortness of breath. Associated symptoms comments: Patient is having associated nausea/nonbilious nonbloody vomiting (at least once per day), loose nonbloody diarrhea (3 or 4 times per day).. Nothing aggravates the symptoms. Nothing relieves the symptoms. She has tried nothing for the symptoms.   Denies any known sick contacts, recent travel, recent antibiotics, suspicious food intake.   Past Medical History:  Diagnosis Date  . Cancer of thyroid ([place])   . Diabetes mellitus without complication ([place])   . DVT (deep venous thrombosis) ([place])   . Hypertension   . Thyroid disease   . Vitreous floaters of left eye     Patient Active Problem List   Diagnosis Date Noted  . 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 Diabetes ([place]) 01/08/2016  . HTN (hypertension) 01/08/2016  . Hypothyroidism 01/08/2016  . PVD (peripheral vascular disease) ([place]) 01/08/2016  . Intermittent chest pain 01/08/2016  . HLD (hyperlipidemia) 01/08/2016  . Exertional angina ([facility])   . Preoperative cardiovascular examination     Past Surgical History:  Procedure Laterality Date  . CARDIAC CATHETERIZATION N/A 01/09/2016   Procedure: Left Heart Cath and Coronary Angiography;  Surgeon: Belva Crome, MD;  Location: [facility];  Service: Cardiovascular;  Laterality: N/A;  . EYE SURGERY    . INSERTION OF ILIAC STENT Left 01/09/2016   Procedure: Left Femoral  Artery Exposure with INSERTION OF LEFT ILIAC STENT;  Surgeon: Serafina Mitchell, MD;  Location: [facility];  Service: Vascular;  Laterality: Left;  . THYROID SURGERY  2008   Not sure what they did  . TUBAL LIGATION  1991    OB History    No data available       Home Medications    Prior to Admission medications   Medication Sig Start Date End Date Taking? Authorizing Provider  atorvastatin (LIPITOR) 20 MG tablet Take 2 tablets (40 mg total) by mouth daily. Reported on 12/11/2015 04/27/16  Yes Ward, Delice Bison, DO  Azilsartan-Chlorthalidone (EDARBYCLOR) 40-25 MG TABS Take 1 tablet by mouth daily.   Yes [provider]  clopidogrel (PLAVIX) 75 MG tablet Take 1 tablet (75 mg total) by mouth daily. 04/27/16  Yes Ward, Delice Bison, DO  escitalopram (LEXAPRO) 20 MG tablet Take 1 tablet by mouth daily for anxiety and depression 02/06/16  Yes [provider]  fluticasone (FLONASE) 50 MCG/ACT nasal spray Place 2 sprays into both nostrils daily.   Yes [provider]  levothyroxine (SYNTHROID, LEVOTHROID) 125 MCG tablet Take 1 tablet (125 mcg total) by mouth daily before breakfast. 04/27/16  Yes Ward, Cyril Mourning N, DO  losartan (COZAAR) 100 MG tablet Take 1 tablet (100 mg total) by mouth daily. 04/27/16  Yes Ward, Delice Bison, DO  aspirin EC 81 MG EC tablet Take 1 tablet (81 mg total) by mouth daily. Patient not taking: Reported on 11/10/2016 01/11/16   Dellia Nims, MD  butalbital-acetaminophen-caffeine (FIORICET, ESGIC) [phone] MG tablet Take 1-2 tablets  by mouth every 6 (six) hours as needed for headache. Patient not taking: Reported on 11/10/2016 04/27/16 04/27/17  Ward, Delice Bison, DO  chlorthalidone (HYGROTON) 25 MG tablet Take 1 tablet (25 mg total) by mouth daily. Patient not taking: Reported on 11/10/2016 04/27/16   Ward, Delice Bison, DO  dicyclomine (BENTYL) 20 MG tablet Take 1 tablet (20 mg total) by mouth 3 (three) times daily before meals. As needed for abdominal cramping Patient not  taking: Reported on 11/10/2016 04/27/16   Ward, Delice Bison, DO  furosemide (LASIX) 20 MG tablet Take 1 tablet (20 mg total) by mouth daily. Patient not taking: Reported on 11/10/2016 04/27/16   Ward, Delice Bison, DO  liraglutide (VICTOZA) 18 MG/3ML SOPN Inject 0.1 mLs (0.6 mg total) into the skin daily. Patient not taking: Reported on 11/10/2016 04/27/16   Ward, Delice Bison, DO  meloxicam (MOBIC) 15 MG tablet TAKE 1 TABLET BY MOUTH EVERY DAY AS NEEDED WITH FOOD 12/15/15   [provider]  metFORMIN (GLUCOPHAGE) 500 MG tablet Take 1 tablet (500 mg total) by mouth daily with breakfast. Reported on 12/11/2015 Patient not taking: Reported on 11/10/2016 04/27/16   Ward, Delice Bison, DO  nicotine (NICODERM CQ - DOSED IN MG/24 HR) 7 mg/24hr patch Place 1 patch (7 mg total) onto the skin daily. Patient not taking: Reported on 11/10/2016 01/11/16   Dellia Nims, MD  nicotine polacrilex (NICORETTE) 2 MG gum Take 1 each (2 mg total) by mouth as needed for smoking cessation. Patient not taking: Reported on 11/10/2016 01/11/16   Dellia Nims, MD  ondansetron (ZOFRAN ODT) 4 MG disintegrating tablet Take 1 tablet (4 mg total) by mouth every 8 (eight) hours as needed for nausea or vomiting. 11/10/16 11/13/16  Fatima Blank, MD  oxyCODONE-acetaminophen (PERCOCET) 7.5-325 MG tablet Take 1 tablet by mouth 3 (three) times daily as needed for moderate pain. Patient not taking: Reported on 11/10/2016 01/11/16   Gabriel Earing, PA-C    Family History Family History  Problem Relation Age of Onset  . Breast cancer Mother   . CAD Sister 48       MI    Social History Social History  Substance Use Topics  . Smoking status: Light Tobacco Smoker    Packs/day: 0.25    Types: Cigarettes  . Smokeless tobacco: Never Used  . Alcohol use No     Allergies   Bee venom   Review of Systems Review of Systems  Respiratory: Negative for shortness of breath.   Cardiovascular: Negative for chest pain.  Neurological: Positive  for headaches (headache began 2 days ago; intermittent in nature. Associated with emesis.).  All other systems are reviewed and are negative for acute change except as noted in the HPI    Physical Exam Updated Vital Signs BP (!) 148/97 (BP Location: Right Arm)   Pulse 62   Temp 97.6 F (36.4 C) (Oral)   Resp 16   Ht 5\' 4"  (1.626 m)   Wt 77.1 kg (170 lb)   LMP 03/21/2014   SpO2 97%   BMI 29.18 kg/m   Physical Exam  Constitutional: She is oriented to person, place, and time. She appears well-developed and well-nourished. No distress.  HENT:  Head: Normocephalic and atraumatic.  Nose: Nose normal.  Eyes: Conjunctivae and EOM are normal. Pupils are equal, round, and reactive to light. Right eye exhibits no discharge. Left eye exhibits no discharge. No scleral icterus.  Neck: Normal range of motion. Neck supple.  Cardiovascular: Normal  rate and regular rhythm.  Exam reveals no gallop and no friction rub.   No murmur heard. Pulmonary/Chest: Effort normal and breath sounds normal. No stridor. No respiratory distress. She has no rales.  Abdominal: Soft. She exhibits no distension. There is no tenderness. There is no rigidity, no rebound, no guarding and no CVA tenderness.  Musculoskeletal: She exhibits no edema or tenderness.  Neurological: She is alert and oriented to person, place, and time. She has normal strength. She displays no tremor. No cranial nerve deficit or sensory deficit. Coordination normal.  Skin: Skin is warm and dry. No rash noted. She is not diaphoretic. No erythema.  Psychiatric: She has a normal mood and affect.  Vitals reviewed.    ED Treatments / Results  Labs (all labs ordered are listed, but only abnormal results are displayed) Labs Reviewed  LIPASE, BLOOD - Abnormal; Notable for the following:       Result Value   Lipase 55 (*)    All other components within normal limits  COMPREHENSIVE METABOLIC PANEL - Abnormal; Notable for the following:     Potassium 3.1 (*)    Glucose, Bld 116 (*)    All other components within normal limits  CBG MONITORING, ED - Abnormal; Notable for the following:    Glucose-Capillary 128 (*)    All other components within normal limits  CBC  URINALYSIS, ROUTINE W REFLEX MICROSCOPIC    EKG  EKG Interpretation None       Radiology No results found.  Procedures Procedures (including critical care time)  Medications Ordered in ED Medications  ondansetron (ZOFRAN-ODT) disintegrating tablet 4 mg (4 mg Oral Given 11/10/16 1150)  sodium chloride 0.9 % bolus 1,000 mL (0 mLs Intravenous Stopped 11/10/16 1432)  ketorolac (TORADOL) 15 MG/ML injection 15 mg (15 mg Intravenous Given 11/10/16 1307)     Initial Impression / Assessment and Plan / ED Course  I have reviewed the triage vital signs and the nursing notes.  Pertinent labs & imaging results that were available during my care of the patient were reviewed by me and considered in my medical decision making (see chart for details).     51 y.o. female presents with vomiting, diarrhea for 4 days. No possible suspicious food intake. adequate oral tolerance. Patient also complaining of a tension-like headache. Rest of history as above.  Patient appears well, not in distress, and with no signs of toxicity or dehydration. Abdomen benign. Rest of the exam as above  Labs grossly reassuring without evidence of significant electrolyte derangements, renal insufficiency, DKA, pancreatitis, biliary obstruction, urinary tract infection.  Headaches since he most likely secondary to dehydration. Exam was nonfocal. No suspicious for intracranial hemorrhage, I I H, meningitis. Do not feel that advanced imaging is warranted at this time.  Most consistent with viral process.  Doubt appendicitis, diverticulitis, severe colitis, dysentery.    Patient provided with IV fluids and nausea medicine. Significant improvement in her symptomatology.  Able to tolerate oral  intake in the ED.  Discussed symptomatic treatment with the parents and they will follow closely with their PCP.   Final Clinical Impressions(s) / ED Diagnoses   Final diagnoses:  Nausea vomiting and diarrhea  Episodic tension-type headache, not intractable  Dehydration   Disposition: Discharge  Condition: Good  I have discussed the results, Dx and Tx plan with the patient who expressed understanding and agree(s) with the plan. Discharge instructions discussed at great length. The patient was given strict return precautions who verbalized understanding of  the instructions. No further questions at time of discharge.    New Prescriptions   ONDANSETRON (ZOFRAN ODT) 4 MG DISINTEGRATING TABLET    Take 1 tablet (4 mg total) by mouth every 8 (eight) hours as needed for nausea or vomiting.    Follow Up: Elizabeth Palau, MD  Schedule an appointment as soon as possible for a visit  in 5-7 days, If symptoms do not improve or  worsen      [place] Guiles, Grayce Sessions, MD 11/10/16 1458

## 2016-11-10 NOTE — ED Triage Notes (Signed)
Pt states headache, nausea, L flank pain and weakness x 2 days.  States stopped taking insulin 4 days ago b/c it made her feel sick every time she took it.

## 2016-11-10 NOTE — ED Notes (Signed)
MD Cardama at the bedside  

## 2016-11-30 ENCOUNTER — Emergency Department (HOSPITAL_COMMUNITY)
Admission: EM | Admit: 2016-11-30 | Discharge: 2016-11-30 | Disposition: A | Discharge status: Home / Self Care | Payer: Medicare Other | Attending: Emergency Medicine | Admitting: Emergency Medicine

## 2016-11-30 ENCOUNTER — Encounter (HOSPITAL_COMMUNITY): Payer: Self-pay | Admitting: Vascular Surgery

## 2016-11-30 ENCOUNTER — Emergency Department (HOSPITAL_COMMUNITY): Payer: Medicare Other

## 2016-11-30 DIAGNOSIS — Z7982 Long term (current) use of aspirin: Secondary | ICD-10-CM | POA: Diagnosis not present

## 2016-11-30 DIAGNOSIS — E119 Type 2 diabetes mellitus without complications: Secondary | ICD-10-CM | POA: Insufficient documentation

## 2016-11-30 DIAGNOSIS — Z85850 Personal history of malignant neoplasm of thyroid: Secondary | ICD-10-CM | POA: Diagnosis not present

## 2016-11-30 DIAGNOSIS — Z79899 Other long term (current) drug therapy: Secondary | ICD-10-CM | POA: Diagnosis not present

## 2016-11-30 DIAGNOSIS — R519 Headache, unspecified: Secondary | ICD-10-CM

## 2016-11-30 DIAGNOSIS — I739 Peripheral vascular disease, unspecified: Secondary | ICD-10-CM | POA: Insufficient documentation

## 2016-11-30 DIAGNOSIS — Z7984 Long term (current) use of oral hypoglycemic drugs: Secondary | ICD-10-CM | POA: Diagnosis not present

## 2016-11-30 DIAGNOSIS — E039 Hypothyroidism, unspecified: Secondary | ICD-10-CM | POA: Insufficient documentation

## 2016-11-30 DIAGNOSIS — I10 Essential (primary) hypertension: Secondary | ICD-10-CM | POA: Diagnosis not present

## 2016-11-30 DIAGNOSIS — R109 Unspecified abdominal pain: Secondary | ICD-10-CM | POA: Diagnosis not present

## 2016-11-30 DIAGNOSIS — R51 Headache: Secondary | ICD-10-CM | POA: Diagnosis not present

## 2016-11-30 DIAGNOSIS — Z7902 Long term (current) use of antithrombotics/antiplatelets: Secondary | ICD-10-CM | POA: Diagnosis not present

## 2016-11-30 DIAGNOSIS — F17210 Nicotine dependence, cigarettes, uncomplicated: Secondary | ICD-10-CM | POA: Diagnosis not present

## 2016-11-30 LAB — COMPREHENSIVE METABOLIC PANEL
ALT: 17 U/L (ref 14–54)
AST: 20 U/L (ref 15–41)
Albumin: 4.2 g/dL (ref 3.5–5.0)
Alkaline Phosphatase: 76 U/L (ref 38–126)
Anion gap: 9 (ref 5–15)
BUN: 22 mg/dL — ABNORMAL HIGH (ref 6–20)
CO2: 26 mmol/L (ref 22–32)
Calcium: 9.4 mg/dL (ref 8.9–10.3)
Chloride: 105 mmol/L (ref 101–111)
Creatinine, Ser: 1.35 mg/dL — ABNORMAL HIGH (ref 0.44–1.00)
GFR calc Af Amer: 52 mL/min — ABNORMAL LOW (ref >60)
GFR calc non Af Amer: 45 mL/min — ABNORMAL LOW (ref >60)
Glucose, Bld: 112 mg/dL — ABNORMAL HIGH (ref 65–99)
Potassium: 3.6 mmol/L (ref 3.5–5.1)
Sodium: 140 mmol/L (ref 135–145)
Total Bilirubin: 0.5 mg/dL (ref 0.3–1.2)
Total Protein: 7.2 g/dL (ref 6.5–8.1)

## 2016-11-30 LAB — CBC
HCT: 40.7 % (ref 36.0–46.0)
Hemoglobin: 13.4 g/dL (ref 12.0–15.0)
MCH: 29.6 pg (ref 26.0–34.0)
MCHC: 32.9 g/dL (ref 30.0–36.0)
MCV: 89.8 fL (ref 78.0–100.0)
Platelets: 275 10*3/uL (ref 150–400)
RBC: 4.53 MIL/uL (ref 3.87–5.11)
RDW: 14.8 % (ref 11.5–15.5)
WBC: 6.5 10*3/uL (ref 4.0–10.5)

## 2016-11-30 LAB — URINALYSIS, ROUTINE W REFLEX MICROSCOPIC
Bilirubin Urine: NEGATIVE
Glucose, UA: NEGATIVE mg/dL
Hgb urine dipstick: NEGATIVE
Ketones, ur: NEGATIVE mg/dL
Leukocytes, UA: NEGATIVE
Nitrite: NEGATIVE
Protein, ur: NEGATIVE mg/dL
Specific Gravity, Urine: 1.02 (ref 1.005–1.030)
pH: 6 (ref 5.0–8.0)

## 2016-11-30 LAB — LIPASE, BLOOD: Lipase: 51 U/L (ref 11–51)

## 2016-11-30 MED ORDER — SODIUM CHLORIDE 0.9 % IV BOLUS (SEPSIS)
1000.0000 mL | Freq: Once | INTRAVENOUS | Status: AC
  Administered 2016-11-30: 1000 mL via INTRAVENOUS

## 2016-11-30 MED ORDER — KETOROLAC TROMETHAMINE 15 MG/ML IJ SOLN
15.0000 mg | Freq: Once | INTRAMUSCULAR | Status: AC
  Administered 2016-11-30: 15 mg via INTRAVENOUS
  Filled 2016-11-30: qty 1

## 2016-11-30 MED ORDER — PROCHLORPERAZINE EDISYLATE 5 MG/ML IJ SOLN
5.0000 mg | Freq: Once | INTRAMUSCULAR | Status: AC
  Administered 2016-11-30: 5 mg via INTRAVENOUS
  Filled 2016-11-30: qty 2

## 2016-11-30 MED ORDER — DIPHENHYDRAMINE HCL 50 MG/ML IJ SOLN
12.5000 mg | Freq: Once | INTRAMUSCULAR | Status: AC
  Administered 2016-11-30: 12.5 mg via INTRAVENOUS
  Filled 2016-11-30: qty 1

## 2016-11-30 MED ORDER — IOPAMIDOL (ISOVUE-300) INJECTION 61%
INTRAVENOUS | Status: AC
Start: 2016-11-30 — End: 2016-11-30
  Administered 2016-11-30: 100 mL
  Filled 2016-11-30: qty 100

## 2016-11-30 NOTE — ED Provider Notes (Signed)
[facility] DEPT Provider Note    By signing my name below, I, Bea Graff, attest that this documentation has been prepared under the direction and in the presence of Virgel Manifold, MD. Electronically Signed: Bea Graff, ED Scribe. 11/30/16. 2:48 PM.    History   Chief Complaint Chief Complaint  Patient presents with  . Headache    The history is provided by the patient and medical records. No language interpreter was used.    Jordan Harvey is a 51 y.o. female with PMHx of DM, DVT, HTN and thyroid disease who presents to the Emergency Department complaining of a severe HA that began four days ago. She reports associated tinnitus, diarrhea, intermittent abdominal pain, nausea and vomiting. She reports phonophobia and photophobia. She reports some light headedness as well. Pt was seen here approximately 3 weeks ago for similar symptoms. She has been taking medication for her symptoms prescribed previously. Sitting up and standing increases the head pain. She denies alleviating factors. She denies fever, chills, neck pain. She reports h/o migraines but states this HA is different. She reports h/o cesarean section x 3 but denies any additional abdominal surgeries.    Past Medical History:  Diagnosis Date  . Cancer of thyroid ([place])   . Diabetes mellitus without complication ([place])   . DVT (deep venous thrombosis) ([place])   . Hypertension   . Thyroid disease   . Vitreous floaters of left eye     Patient Active Problem List   Diagnosis Date Noted  . 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 Diabetes ([place]) 01/08/2016  . HTN (hypertension) 01/08/2016  . Hypothyroidism 01/08/2016  . PVD (peripheral vascular disease) ([place]) 01/08/2016  . Intermittent chest pain 01/08/2016  . HLD (hyperlipidemia) 01/08/2016  . Exertional angina ([facility])   . Preoperative cardiovascular examination     Past Surgical History:  Procedure Laterality Date  . CARDIAC CATHETERIZATION N/A  01/09/2016   Procedure: Left Heart Cath and Coronary Angiography;  Surgeon: Belva Crome, MD;  Location: [facility];  Service: Cardiovascular;  Laterality: N/A;  . EYE SURGERY    . INSERTION OF ILIAC STENT Left 01/09/2016   Procedure: Left Femoral Artery Exposure with INSERTION OF LEFT ILIAC STENT;  Surgeon: Serafina Mitchell, MD;  Location: [facility];  Service: Vascular;  Laterality: Left;  . THYROID SURGERY  2008   Not sure what they did  . TUBAL LIGATION  1991    OB History    No data available       Home Medications    Prior to Admission medications   Medication Sig Start Date End Date Taking? Authorizing Provider  Azilsartan-Chlorthalidone (EDARBYCLOR) 40-25 MG TABS Take 1 tablet by mouth daily.   Yes [provider]  clopidogrel (PLAVIX) 75 MG tablet Take 1 tablet (75 mg total) by mouth daily. 04/27/16  Yes Ward, Delice Bison, DO  escitalopram (LEXAPRO) 20 MG tablet Take 1 tablet by mouth daily for anxiety and depression 02/06/16  Yes [provider]  levothyroxine (SYNTHROID, LEVOTHROID) 125 MCG tablet Take 1 tablet (125 mcg total) by mouth daily before breakfast. 04/27/16  Yes Ward, Cyril Mourning N, DO  losartan (COZAAR) 100 MG tablet Take 1 tablet (100 mg total) by mouth daily. 04/27/16  Yes Ward, Delice Bison, DO  lovastatin (MEVACOR) 40 MG tablet Take 40 mg by mouth at bedtime.   Yes [provider]  ondansetron (ZOFRAN-ODT) 4 MG disintegrating tablet Take 4 mg by mouth every 8 (eight) hours as needed  for nausea or vomiting.   Yes [provider]  SUMAtriptan (IMITREX) 50 MG tablet Take 50 mg by mouth every 2 (two) hours as needed for migraine. May repeat in 2 hours if headache persists or recurs.   Yes [provider]  aspirin EC 81 MG EC tablet Take 1 tablet (81 mg total) by mouth daily. Patient not taking: Reported on 11/10/2016 01/11/16   Dellia Nims, MD  atorvastatin (LIPITOR) 20 MG tablet Take 2 tablets (40 mg total) by mouth daily. Reported  on 12/11/2015 Patient not taking: Reported on 11/30/2016 04/27/16   Ward, Delice Bison, DO  butalbital-acetaminophen-caffeine (FIORICET, ESGIC) [phone] MG tablet Take 1-2 tablets by mouth every 6 (six) hours as needed for headache. Patient not taking: Reported on 11/10/2016 04/27/16 04/27/17  Ward, Delice Bison, DO  chlorthalidone (HYGROTON) 25 MG tablet Take 1 tablet (25 mg total) by mouth daily. Patient not taking: Reported on 11/10/2016 04/27/16   Ward, Delice Bison, DO  dicyclomine (BENTYL) 20 MG tablet Take 1 tablet (20 mg total) by mouth 3 (three) times daily before meals. As needed for abdominal cramping Patient not taking: Reported on 11/10/2016 04/27/16   Ward, Delice Bison, DO  fluticasone (FLONASE) 50 MCG/ACT nasal spray Place 2 sprays into both nostrils daily.    [provider]  furosemide (LASIX) 20 MG tablet Take 1 tablet (20 mg total) by mouth daily. Patient not taking: Reported on 11/10/2016 04/27/16   Ward, Delice Bison, DO  liraglutide (VICTOZA) 18 MG/3ML SOPN Inject 0.1 mLs (0.6 mg total) into the skin daily. Patient not taking: Reported on 11/10/2016 04/27/16   Ward, Delice Bison, DO  meloxicam (MOBIC) 15 MG tablet TAKE 1 TABLET BY MOUTH EVERY DAY AS NEEDED WITH FOOD 12/15/15   [provider]  metFORMIN (GLUCOPHAGE) 500 MG tablet Take 1 tablet (500 mg total) by mouth daily with breakfast. Reported on 12/11/2015 Patient not taking: Reported on 11/10/2016 04/27/16   Ward, Delice Bison, DO  nicotine (NICODERM CQ - DOSED IN MG/24 HR) 7 mg/24hr patch Place 1 patch (7 mg total) onto the skin daily. Patient not taking: Reported on 11/10/2016 01/11/16   Dellia Nims, MD  nicotine polacrilex (NICORETTE) 2 MG gum Take 1 each (2 mg total) by mouth as needed for smoking cessation. Patient not taking: Reported on 11/10/2016 01/11/16   Dellia Nims, MD  oxyCODONE-acetaminophen (PERCOCET) 7.5-325 MG tablet Take 1 tablet by mouth 3 (three) times daily as needed for moderate pain. Patient not taking: Reported on  11/10/2016 01/11/16   Gabriel Earing, PA-C    Family History Family History  Problem Relation Age of Onset  . Breast cancer Mother   . CAD Sister 73       MI    Social History Social History  Substance Use Topics  . Smoking status: Light Tobacco Smoker    Packs/day: 0.25    Types: Cigarettes  . Smokeless tobacco: Never Used  . Alcohol use No     Allergies   Bee venom   Review of Systems Review of Systems All other systems reviewed and are negative for acute change except as noted in the HPI.   Physical Exam Updated Vital Signs BP 106/79 (BP Location: Right Arm)   Pulse 70   Temp 98 F (36.7 C) (Oral)   Resp 16   LMP 03/21/2014   SpO2 99%   Physical Exam  Constitutional: She is oriented to person, place, and time. She appears well-developed and well-nourished. No distress.  HENT:  Head: Normocephalic and atraumatic.  Eyes: EOM are normal.  Prosthetic right eye.  Neck: Normal range of motion. Neck supple.  Cardiovascular: Normal rate, regular rhythm and normal heart sounds.   Pulmonary/Chest: Effort normal and breath sounds normal.  Abdominal: Soft. She exhibits no distension. There is tenderness in the epigastric area and periumbilical area.  Periumbilical and mild epigastric tenderness.  Musculoskeletal: Normal range of motion.  Neurological: She is alert and oriented to person, place, and time. She displays normal reflexes. No cranial nerve deficit or sensory deficit. She exhibits normal muscle tone. Coordination normal.  Cranial nerves II-XII intact. Good finger to nose test.  Skin: Skin is warm and dry.  Psychiatric: She has a normal mood and affect. Judgment normal.  Nursing note and vitals reviewed.    ED Treatments / Results  DIAGNOSTIC STUDIES: Oxygen Saturation is 99% on RA, normal by my interpretation.   COORDINATION OF CARE: 11:06 AM- Will order imaging. Pt verbalizes understanding and agrees to plan.  Medications  sodium chloride 0.9 %  bolus 1,000 mL (0 mLs Intravenous Stopped 11/30/16 1443)  prochlorperazine (COMPAZINE) injection 5 mg (5 mg Intravenous Given 11/30/16 1149)  ketorolac (TORADOL) 15 MG/ML injection 15 mg (15 mg Intravenous Given 11/30/16 1150)  diphenhydrAMINE (BENADRYL) injection 12.5 mg (12.5 mg Intravenous Given 11/30/16 1149)  iopamidol (ISOVUE-300) 61 % injection (100 mLs  Contrast Given 11/30/16 1353)    Labs (all labs ordered are listed, but only abnormal results are displayed) Labs Reviewed  COMPREHENSIVE METABOLIC PANEL - Abnormal; Notable for the following:       Result Value   Glucose, Bld 112 (*)    BUN 22 (*)    Creatinine, Ser 1.35 (*)    GFR calc non Af Amer 45 (*)    GFR calc Af Amer 52 (*)    All other components within normal limits  LIPASE, BLOOD  CBC  URINALYSIS, ROUTINE W REFLEX MICROSCOPIC    EKG  EKG Interpretation None       Radiology Ct Head Wo Contrast  Result Date: 11/30/2016 CLINICAL DATA:  Headache 3 days EXAM: CT HEAD WITHOUT CONTRAST TECHNIQUE: Contiguous axial images were obtained from the base of the skull through the vertex without intravenous contrast. COMPARISON:  06/28/2015 FINDINGS: Brain: No evidence of acute infarction, hemorrhage, hydrocephalus, extra-axial collection or mass lesion/mass effect. Vascular: No hyperdense vessel or unexpected calcification. Skull: Negative for acute fracture. Probable chronic fracture right orbital floor and medial orbit Sinuses/Orbits: Prosthetic right eye. 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 Electronically Signed   By: Franchot Gallo M.D.   On: 11/30/2016 14:27   Ct Abdomen Pelvis W Contrast  Result Date: 11/30/2016 CLINICAL DATA:  Jodye Scali is a 52 y.o. female with PMHx of DM, DVT, HTN and thyroid disease who presents to the Emergency Department complaining of a severe HA that began four days ago. She  reports associated tinnitus, diarrhea, intermittent abdominal pain, nausea and vomiting. She reports phonophobia and photophobia. She reports some light headedness as well. Pt was seen here approximately 3 weeks ago for similar symptoms. She has been taking medication for her symptoms prescribed previously. Sitting up and standing increases the head pain. She denies alleviating factors. She denies fever, chills, neck pain. She reports h/o migraines but states this HA is different. She reports h/o cesarean section x 3 but denies any additional abdominal surgeries. History of thyroid  cancer, diabetes and DVT. EXAM: CT ABDOMEN AND PELVIS WITH CONTRAST TECHNIQUE: Multidetector CT imaging of the abdomen and pelvis was performed using the standard protocol following bolus administration of intravenous contrast. CONTRAST:  145mL ISOVUE-300 IOPAMIDOL (ISOVUE-300) INJECTION 61% COMPARISON:  CT 04/27/2016 and 01/08/2016. FINDINGS: Lower chest: Mild dependent atelectasis at both lung bases, stable. No significant pleural or pericardial effusion. Hepatobiliary: 2 cm exophytic lesion involving the lateral segment of the left hepatic lobe on image number 10 is stable and has been previously characterized as a hemangioma. No other hepatic abnormalities are identified. No evidence of gallstones, gallbladder wall thickening or biliary dilatation. Pancreas: Unremarkable. No pancreatic ductal dilatation or surrounding inflammatory changes. Spleen: Normal in size without focal abnormality. Adrenals/Urinary Tract: Mildly complex right adrenal nodule again noted, measuring 4.3 x 3.1 cm on image 21. This is unchanged from baseline examination of 03/02/2014 and has been previously characterized as an adenoma. The left adrenal gland appears normal. The kidneys appear normal without evidence of urinary tract calculus, suspicious lesion or hydronephrosis. No bladder abnormalities are seen. Stomach/Bowel: No evidence of bowel wall thickening,  distention or surrounding inflammatory change. The appendix appears normal. Vascular/Lymphatic: There are no enlarged abdominal or pelvic lymph nodes. Diffuse aortic and branch vessel atherosclerosis again noted with mural thickening. Left common iliac stent appears patent. Reproductive: The uterus and ovaries appear stable. No evidence of adnexal mass. Other: Stable small umbilical hernia containing only fat. There are postsurgical changes in the left groin which have mildly improved. No ascites. Musculoskeletal: No acute or significant osseous findings. Mild spondylosis, greatest in the lower thoracic region. IMPRESSION: 1. No acute findings or explanation for the patient's symptoms. 2. Stable left hepatic and right adrenal lesions. 3.  Aortic Atherosclerosis (ICD10-I70.0). Electronically Signed   By: Richardean Sale M.D.   On: 11/30/2016 14:38    Procedures Procedures (including critical care time)  Medications Ordered in ED Medications  sodium chloride 0.9 % bolus 1,000 mL (0 mLs Intravenous Stopped 11/30/16 1443)  prochlorperazine (COMPAZINE) injection 5 mg (5 mg Intravenous Given 11/30/16 1149)  ketorolac (TORADOL) 15 MG/ML injection 15 mg (15 mg Intravenous Given 11/30/16 1150)  diphenhydrAMINE (BENADRYL) injection 12.5 mg (12.5 mg Intravenous Given 11/30/16 1149)  iopamidol (ISOVUE-300) 61 % injection (100 mLs  Contrast Given 11/30/16 1353)     Initial Impression / Assessment and Plan / ED Course  I have reviewed the triage vital signs and the nursing notes.  Pertinent labs & imaging results that were available during my care of the patient were reviewed by me and considered in my medical decision making (see chart for details).     Final Clinical Impressions(s) / ED Diagnoses   Final diagnoses:  Nonintractable headache, unspecified chronicity pattern, unspecified headache type  Abdominal pain, unspecified abdominal location    New Prescriptions New Prescriptions   No medications  on file    I personally preformed the services scribed in my presence. The recorded information has been reviewed is accurate. Virgel Manifold, MD.     Virgel Manifold, MD 12/11/16 [phone]

## 2016-11-30 NOTE — ED Notes (Signed)
Pt ambulated to room from waiting area. Pt had no complaints while ambulating. Pt assisted into gown and onto bp and o2 monitor.

## 2016-11-30 NOTE — ED Triage Notes (Signed)
Pt reports to the ED for eval of multiple complaints. She states that x 4 days she has had a headache, tinnitus, N/V/D, fatigue, and mid abd pain. She reports she was seen for her abd pain and was told she was dehydrated and was given fluid/zofran and let her go home. She denies any imaging studies of her head or abd.

## 2016-11-30 NOTE — ED Notes (Signed)
Called CT to inquire as to when patient will be taken. Informed there are  4 patients in front of her. Patient updated.

## 2016-12-06 ENCOUNTER — Encounter: Payer: Self-pay | Admitting: Family

## 2016-12-14 ENCOUNTER — Ambulatory Visit: Payer: Medicare Other | Admitting: Family

## 2016-12-14 ENCOUNTER — Encounter (HOSPITAL_COMMUNITY): Payer: Medicare Other

## 2017-01-01 ENCOUNTER — Emergency Department (HOSPITAL_COMMUNITY): Payer: Medicare Other

## 2017-01-01 ENCOUNTER — Emergency Department (HOSPITAL_COMMUNITY)
Admission: EM | Admit: 2017-01-01 | Discharge: 2017-01-02 | Disposition: A | Discharge status: Home / Self Care | Payer: Medicare Other | Attending: Emergency Medicine | Admitting: Emergency Medicine

## 2017-01-01 ENCOUNTER — Encounter (HOSPITAL_COMMUNITY): Payer: Self-pay | Admitting: Emergency Medicine

## 2017-01-01 DIAGNOSIS — I10 Essential (primary) hypertension: Secondary | ICD-10-CM | POA: Insufficient documentation

## 2017-01-01 DIAGNOSIS — Z720 Tobacco use: Secondary | ICD-10-CM | POA: Insufficient documentation

## 2017-01-01 DIAGNOSIS — M542 Cervicalgia: Secondary | ICD-10-CM | POA: Diagnosis present

## 2017-01-01 DIAGNOSIS — Y939 Activity, unspecified: Secondary | ICD-10-CM | POA: Diagnosis not present

## 2017-01-01 DIAGNOSIS — Z7984 Long term (current) use of oral hypoglycemic drugs: Secondary | ICD-10-CM | POA: Diagnosis not present

## 2017-01-01 DIAGNOSIS — E119 Type 2 diabetes mellitus without complications: Secondary | ICD-10-CM | POA: Insufficient documentation

## 2017-01-01 DIAGNOSIS — Z7982 Long term (current) use of aspirin: Secondary | ICD-10-CM | POA: Diagnosis not present

## 2017-01-01 DIAGNOSIS — Y999 Unspecified external cause status: Secondary | ICD-10-CM | POA: Diagnosis not present

## 2017-01-01 DIAGNOSIS — Z7902 Long term (current) use of antithrombotics/antiplatelets: Secondary | ICD-10-CM | POA: Insufficient documentation

## 2017-01-01 DIAGNOSIS — Z79899 Other long term (current) drug therapy: Secondary | ICD-10-CM | POA: Diagnosis not present

## 2017-01-01 DIAGNOSIS — E039 Hypothyroidism, unspecified: Secondary | ICD-10-CM | POA: Insufficient documentation

## 2017-01-01 DIAGNOSIS — T148XXA Other injury of unspecified body region, initial encounter: Secondary | ICD-10-CM | POA: Diagnosis not present

## 2017-01-01 DIAGNOSIS — Y92410 Unspecified street and highway as the place of occurrence of the external cause: Secondary | ICD-10-CM | POA: Diagnosis not present

## 2017-01-01 MED ORDER — ACETAMINOPHEN 325 MG PO TABS
650.0000 mg | ORAL_TABLET | Freq: Once | ORAL | Status: AC
  Administered 2017-01-01: 650 mg via ORAL
  Filled 2017-01-01: qty 2

## 2017-01-01 NOTE — ED Notes (Signed)
C collar applied on ED arrival.
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
## 2017-01-02 MED ORDER — ACETAMINOPHEN 325 MG PO TABS: 650.0000 mg | ORAL_TABLET | Freq: Four times a day (QID) | ORAL | 0 refills | Status: AC | PRN

## 2017-01-02 MED ORDER — METHOCARBAMOL 500 MG PO TABS: 500.0000 mg | ORAL_TABLET | Freq: Two times a day (BID) | ORAL | 0 refills | Status: DC
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
## 2017-02-14 ENCOUNTER — Encounter: Payer: Self-pay | Admitting: Family

## 2017-02-16 ENCOUNTER — Ambulatory Visit: Payer: Medicare Other | Admitting: Family

## 2017-02-16 ENCOUNTER — Encounter (HOSPITAL_COMMUNITY): Payer: Medicare Other

## 2017-02-22 ENCOUNTER — Encounter: Payer: Self-pay | Admitting: Family

## 2017-02-23 ENCOUNTER — Encounter: Payer: Self-pay | Admitting: Family

## 2017-02-23 ENCOUNTER — Ambulatory Visit (INDEPENDENT_AMBULATORY_CARE_PROVIDER_SITE_OTHER)
Admission: RE | Admit: 2017-02-23 | Discharge: 2017-02-23 | Disposition: A | Discharge status: Home / Self Care | Payer: Medicare Other | Source: Ambulatory Visit | Attending: Family | Admitting: Family

## 2017-02-23 ENCOUNTER — Ambulatory Visit (INDEPENDENT_AMBULATORY_CARE_PROVIDER_SITE_OTHER): Payer: Medicare Other | Admitting: Family

## 2017-02-23 ENCOUNTER — Ambulatory Visit (HOSPITAL_COMMUNITY)
Admission: RE | Admit: 2017-02-23 | Discharge: 2017-02-23 | Disposition: A | Discharge status: Home / Self Care | Payer: Medicare Other | Source: Ambulatory Visit | Attending: Family | Admitting: Family

## 2017-02-23 VITALS — BP 148/98 | HR 70 | Temp 97.2°F | Ht 64.0 in | Wt 178.7 lb

## 2017-02-23 DIAGNOSIS — F17200 Nicotine dependence, unspecified, uncomplicated: Secondary | ICD-10-CM | POA: Diagnosis not present

## 2017-02-23 DIAGNOSIS — E785 Hyperlipidemia, unspecified: Secondary | ICD-10-CM | POA: Diagnosis not present

## 2017-02-23 DIAGNOSIS — F17210 Nicotine dependence, cigarettes, uncomplicated: Secondary | ICD-10-CM | POA: Diagnosis not present

## 2017-02-23 DIAGNOSIS — Z95828 Presence of other vascular implants and grafts: Secondary | ICD-10-CM

## 2017-02-23 DIAGNOSIS — I745 Embolism and thrombosis of iliac artery: Secondary | ICD-10-CM | POA: Diagnosis not present

## 2017-02-23 DIAGNOSIS — I70212 Atherosclerosis of native arteries of extremities with intermittent claudication, left leg: Secondary | ICD-10-CM | POA: Insufficient documentation

## 2017-02-23 DIAGNOSIS — I10 Essential (primary) hypertension: Secondary | ICD-10-CM | POA: Insufficient documentation

## 2017-02-23 DIAGNOSIS — E119 Type 2 diabetes mellitus without complications: Secondary | ICD-10-CM | POA: Diagnosis not present

## 2017-02-23 MED ORDER — CLOPIDOGREL BISULFATE 75 MG PO TABS: 75.0000 mg | ORAL_TABLET | Freq: Every day | ORAL | 1 refills | Status: DC

## 2017-02-23 MED ORDER — METFORMIN HCL 500 MG PO TABS
500.0000 mg | ORAL_TABLET | Freq: Every day | ORAL | 1 refills | Status: DC
Start: 2017-02-23 — End: 2017-03-01
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
## 2017-02-23 NOTE — Patient Instructions (Addendum)
Before your next abdominal ultrasound:  Take two Extra-Strength Gas-X capsules at bedtime the night before the test. Take another two Extra-Strength Gas-X capsules 3 hours before the test.  Avoid gas forming foods the day before the test.      Steps to Quit Smoking Smoking tobacco can be bad for your health. It can also affect almost every organ in your body. Smoking puts you and people around you at risk for many serious long-lasting (chronic) diseases. Quitting smoking is hard, but it is one of the best things that you can do for your health. It is never too late to quit. What are the benefits of quitting smoking? When you quit smoking, you lower your risk for getting serious diseases and conditions. They can include:  Lung cancer or lung disease.  Heart disease.  Stroke.  Heart attack.  Not being able to have children (infertility).  Weak bones (osteoporosis) and broken bones (fractures).  If you have coughing, wheezing, and shortness of breath, those symptoms may get better when you quit. You may also get sick less often. If you are pregnant, quitting smoking can help to lower your chances of having a baby of low birth weight. What can I do to help me quit smoking? Talk with your doctor about what can help you quit smoking. Some things you can do (strategies) include:  Quitting smoking totally, instead of slowly cutting back how much you smoke over a period of time.  Going to in-person counseling. You are more likely to quit if you go to many counseling sessions.  Using resources and support systems, such as: ? Online chats with a counselor. ? Phone quitlines. ? Printed self-help materials. ? Support groups or group counseling. ? Text messaging programs. ? Mobile phone apps or applications.  Taking medicines. Some of these medicines may have nicotine in them. If you are pregnant or breastfeeding, do not take any medicines to quit smoking unless your doctor says it is  okay. Talk with your doctor about counseling or other things that can help you.  Talk with your doctor about using more than one strategy at the same time, such as taking medicines while you are also going to in-person counseling. This can help make quitting easier. What things can I do to make it easier to quit? Quitting smoking might feel very hard at first, but there is a lot that you can do to make it easier. Take these steps:  Talk to your family and friends. Ask them to support and encourage you.  Call phone quitlines, reach out to support groups, or work with a counselor.  Ask people who smoke to not smoke around you.  Avoid places that make you want (trigger) to smoke, such as: ? Bars. ? Parties. ? Smoke-break areas at work.  Spend time with people who do not smoke.  Lower the stress in your life. Stress can make you want to smoke. Try these things to help your stress: ? Getting regular exercise. ? Deep-breathing exercises. ? Yoga. ? Meditating. ? Doing a body scan. To do this, close your eyes, focus on one area of your body at a time from head to toe, and notice which parts of your body are tense. Try to relax the muscles in those areas.  Download or buy apps on your mobile phone or tablet that can help you stick to your quit plan. There are many free apps, such as QuitGuide from the CDC (Centers for Disease Control and Prevention).   You can find more support from smokefree.gov and other websites.  This information is not intended to replace advice given to you by your health care provider. Make sure you discuss any questions you have with your health care provider. Document Released: 03/20/2009 Document Revised: 01/20/2016 Document Reviewed: 10/08/2014 Elsevier Interactive Patient Education  2018 Elsevier Inc.     Peripheral Vascular Disease Peripheral vascular disease (PVD) is a disease of the blood vessels that are not part of your heart and brain. A simple term for  PVD is poor circulation. In most cases, PVD narrows the blood vessels that carry blood from your heart to the rest of your body. This can result in a decreased supply of blood to your arms, legs, and internal organs, like your stomach or kidneys. However, it most often affects a person's lower legs and feet. There are two types of PVD.  Organic PVD. This is the more common type. It is caused by damage to the structure of blood vessels.  Functional PVD. This is caused by conditions that make blood vessels contract and tighten (spasm).  Without treatment, PVD tends to get worse over time. PVD can also lead to acute ischemic limb. This is when an arm or limb suddenly has trouble getting enough blood. This is a medical emergency. Follow these instructions at home:  Take medicines only as told by your doctor.  Do not use any tobacco products, including cigarettes, chewing tobacco, or electronic cigarettes. If you need help quitting, ask your doctor.  Lose weight if you are overweight, and maintain a healthy weight as told by your doctor.  Eat a diet that is low in fat and cholesterol. If you need help, ask your doctor.  Exercise regularly. Ask your doctor for some good activities for you.  Take good care of your feet. ? Wear comfortable shoes that fit well. ? Check your feet often for any cuts or sores. Contact a doctor if:  You have cramps in your legs while walking.  You have leg pain when you are at rest.  You have coldness in a leg or foot.  Your skin changes.  You are unable to get or have an erection (erectile dysfunction).  You have cuts or sores on your feet that are not healing. Get help right away if:  Your arm or leg turns cold and blue.  Your arms or legs become red, warm, swollen, painful, or numb.  You have chest pain or trouble breathing.  You suddenly have weakness in your face, arm, or leg.  You become very confused or you cannot speak.  You suddenly have  a very bad headache.  You suddenly cannot see. This information is not intended to replace advice given to you by your health care provider. Make sure you discuss any questions you have with your health care provider. Document Released: 08/18/2009 Document Revised: 10/30/2015 Document Reviewed: 11/01/2013 Elsevier Interactive Patient Education  2017 Elsevier Inc.  

## 2017-02-24 ENCOUNTER — Encounter: Payer: Self-pay | Admitting: Family

## 2017-03-01 ENCOUNTER — Other Ambulatory Visit: Payer: Self-pay

## 2017-03-01 ENCOUNTER — Telehealth: Payer: Self-pay

## 2017-03-01 MED ORDER — METFORMIN HCL 500 MG PO TABS: 500.0000 mg | ORAL_TABLET | Freq: Every day | ORAL | 1 refills | Status: DC

## 2017-03-01 MED ORDER — CLOPIDOGREL BISULFATE 75 MG PO TABS: 75.0000 mg | ORAL_TABLET | Freq: Every day | ORAL | 1 refills | Status: DC

## 2017-03-07 ENCOUNTER — Telehealth: Payer: Self-pay | Admitting: Surgery

## 2017-03-07 ENCOUNTER — Telehealth: Payer: Self-pay | Admitting: *Deleted

## 2017-03-07 NOTE — Telephone Encounter (Signed)
Jordan Harvey believes that Jordan Harvey was going to call in medication for her stomach and for her leg discomfort, but none have been called in. Her chart shows that Jordan Harvey intended to refill metformin and clopidegrel prescriptions and that those medications were ordered.  I confirmed with Joy at [facility] [phone]) that those orders were received and the medication has been delivered to Jordan Harvey.   Jordan Harvey is having soreness in her left leg for the past 2-3 days.  The soreness is on the tops of her feet, through her calf, and up to her hip.  In the past day, she believes it has been swollen.  She says that it is sore to the touch.  She would like to schedule an appointment and triage is requested to determine the correct testing / appointment to schedule.  Ovidio Hanger

## 2017-03-07 NOTE — Telephone Encounter (Signed)
Called patient twice, leaving voicemail at 4:10 pm to discuss her leg and feet pain.

## 2017-04-11 NOTE — Telephone Encounter (Signed)
Entered in error

## 2017-08-24 ENCOUNTER — Other Ambulatory Visit: Payer: Self-pay | Admitting: Internal Medicine

## 2017-08-24 DIAGNOSIS — E2839 Other primary ovarian failure: Secondary | ICD-10-CM

## 2017-08-25 ENCOUNTER — Encounter: Payer: Self-pay | Admitting: Gastroenterology

## 2017-08-29 ENCOUNTER — Ambulatory Visit (HOSPITAL_COMMUNITY): Payer: Medicare Other | Attending: Family

## 2017-08-29 ENCOUNTER — Ambulatory Visit: Payer: Medicare Other | Admitting: Family

## 2017-08-29 ENCOUNTER — Ambulatory Visit (HOSPITAL_COMMUNITY): Admission: RE | Admit: 2017-08-29 | Payer: Medicare Other | Source: Ambulatory Visit

## 2017-09-21 ENCOUNTER — Inpatient Hospital Stay
Admission: RE | Admit: 2017-09-21 | Discharge: 2017-09-21 | Disposition: A | Discharge status: Home / Self Care | Payer: Medicare Other | Source: Ambulatory Visit | Attending: Internal Medicine | Admitting: Internal Medicine

## 2017-09-22 ENCOUNTER — Ambulatory Visit: Payer: Medicare Other | Admitting: Obstetrics and Gynecology

## 2017-09-23 ENCOUNTER — Other Ambulatory Visit: Payer: Medicare Other

## 2017-09-25 ENCOUNTER — Emergency Department (HOSPITAL_COMMUNITY): Payer: Medicare Other

## 2017-09-25 ENCOUNTER — Encounter (HOSPITAL_COMMUNITY): Payer: Self-pay

## 2017-09-25 ENCOUNTER — Emergency Department (HOSPITAL_COMMUNITY)
Admission: EM | Admit: 2017-09-25 | Discharge: 2017-09-25 | Disposition: A | Discharge status: Home / Self Care | Payer: Medicare Other | Attending: Physician Assistant | Admitting: Physician Assistant

## 2017-09-25 ENCOUNTER — Other Ambulatory Visit: Payer: Self-pay

## 2017-09-25 DIAGNOSIS — I10 Essential (primary) hypertension: Secondary | ICD-10-CM | POA: Diagnosis not present

## 2017-09-25 DIAGNOSIS — N3000 Acute cystitis without hematuria: Secondary | ICD-10-CM | POA: Diagnosis not present

## 2017-09-25 DIAGNOSIS — Z7982 Long term (current) use of aspirin: Secondary | ICD-10-CM | POA: Insufficient documentation

## 2017-09-25 DIAGNOSIS — Z79899 Other long term (current) drug therapy: Secondary | ICD-10-CM | POA: Diagnosis not present

## 2017-09-25 DIAGNOSIS — E119 Type 2 diabetes mellitus without complications: Secondary | ICD-10-CM | POA: Insufficient documentation

## 2017-09-25 DIAGNOSIS — Z7984 Long term (current) use of oral hypoglycemic drugs: Secondary | ICD-10-CM | POA: Diagnosis not present

## 2017-09-25 DIAGNOSIS — E039 Hypothyroidism, unspecified: Secondary | ICD-10-CM | POA: Diagnosis not present

## 2017-09-25 DIAGNOSIS — R42 Dizziness and giddiness: Secondary | ICD-10-CM | POA: Diagnosis present

## 2017-09-25 DIAGNOSIS — F17210 Nicotine dependence, cigarettes, uncomplicated: Secondary | ICD-10-CM | POA: Diagnosis not present

## 2017-09-25 HISTORY — DX: Transient cerebral ischemic attack, unspecified: G45.9

## 2017-09-25 LAB — BASIC METABOLIC PANEL
ANION GAP: 9 (ref 5–15)
BUN: 11 mg/dL (ref 6–20)
CHLORIDE: 103 mmol/L (ref 101–111)
CO2: 26 mmol/L (ref 22–32)
Calcium: 9.4 mg/dL (ref 8.9–10.3)
Creatinine, Ser: 1.15 mg/dL — ABNORMAL HIGH (ref 0.44–1.00)
GFR calc non Af Amer: 54 mL/min — ABNORMAL LOW (ref >60)
Glucose, Bld: 89 mg/dL (ref 65–99)
POTASSIUM: 3.5 mmol/L (ref 3.5–5.1)
SODIUM: 138 mmol/L (ref 135–145)

## 2017-09-25 LAB — CBC
HEMATOCRIT: 41.2 % (ref 36.0–46.0)
HEMOGLOBIN: 13.5 g/dL (ref 12.0–15.0)
MCH: 31 pg (ref 26.0–34.0)
MCHC: 32.8 g/dL (ref 30.0–36.0)
MCV: 94.7 fL (ref 78.0–100.0)
Platelets: 298 10*3/uL (ref 150–400)
RBC: 4.35 MIL/uL (ref 3.87–5.11)
RDW: 15.2 % (ref 11.5–15.5)
WBC: 8 10*3/uL (ref 4.0–10.5)

## 2017-09-25 LAB — CBG MONITORING, ED: Glucose-Capillary: 87 mg/dL (ref 65–99)

## 2017-09-25 LAB — URINALYSIS, ROUTINE W REFLEX MICROSCOPIC
BILIRUBIN URINE: NEGATIVE
Bacteria, UA: NONE SEEN
Glucose, UA: NEGATIVE mg/dL
Hgb urine dipstick: NEGATIVE
KETONES UR: NEGATIVE mg/dL
NITRITE: NEGATIVE
PROTEIN: NEGATIVE mg/dL
Specific Gravity, Urine: 1.017 (ref 1.005–1.030)
pH: 7 (ref 5.0–8.0)

## 2017-09-25 LAB — I-STAT BETA HCG BLOOD, ED (MC, WL, AP ONLY)

## 2017-09-25 MED ORDER — PROCHLORPERAZINE EDISYLATE 10 MG/2ML IJ SOLN
10.0000 mg | Freq: Once | INTRAMUSCULAR | Status: AC
  Administered 2017-09-25: 10 mg via INTRAVENOUS
  Filled 2017-09-25: qty 2

## 2017-09-25 MED ORDER — CEPHALEXIN 250 MG PO CAPS
500.0000 mg | ORAL_CAPSULE | Freq: Once | ORAL | Status: AC
  Administered 2017-09-25: 500 mg via ORAL
  Filled 2017-09-25: qty 2

## 2017-09-25 MED ORDER — SODIUM CHLORIDE 0.9 % IV BOLUS
1000.0000 mL | Freq: Once | INTRAVENOUS | Status: AC
  Administered 2017-09-25: 1000 mL via INTRAVENOUS

## 2017-09-25 MED ORDER — DIPHENHYDRAMINE HCL 50 MG/ML IJ SOLN
25.0000 mg | Freq: Once | INTRAMUSCULAR | Status: AC
  Administered 2017-09-25: 25 mg via INTRAVENOUS
  Filled 2017-09-25: qty 1

## 2017-09-25 MED ORDER — KETOROLAC TROMETHAMINE 15 MG/ML IJ SOLN
15.0000 mg | Freq: Once | INTRAMUSCULAR | Status: AC
  Administered 2017-09-25: 15 mg via INTRAVENOUS
  Filled 2017-09-25: qty 1

## 2017-09-25 MED ORDER — CEPHALEXIN 500 MG PO CAPS: 500.0000 mg | ORAL_CAPSULE | Freq: Four times a day (QID) | ORAL | 0 refills | Status: DC

## 2017-09-25 NOTE — ED Provider Notes (Signed)
[facility]PART[facility] Provider Note   CSN: [id number] Arrival date & time: 09/25/17  1433     History   Chief Complaint Chief Complaint  Patient presents with  . Dizziness    HPI Jordan Harvey is a 52 y.o. female.  HPI   52 yo fmeale with ho htn, tia, dvt and dm.  4 days ago after a cookout she had mild lightheadedness, and photophobia. These have been constnat for the last 4 days.  Patient vomited several times and has had dirarhea for two days.     Past Medical History:  Diagnosis Date  . Cancer of thyroid ([place])   . Diabetes mellitus without complication ([place])   . DVT (deep venous thrombosis) ([place])   . Hypertension   . Thyroid disease   . TIA (transient ischemic attack)   . Vitreous floaters of left eye     Patient Active Problem List   Diagnosis Date Noted  . 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 Diabetes ([place]) 01/08/2016  . HTN (hypertension) 01/08/2016  . Hypothyroidism 01/08/2016  . PVD (peripheral vascular disease) ([place]) 01/08/2016  . Intermittent chest pain 01/08/2016  . HLD (hyperlipidemia) 01/08/2016  . Exertional angina ([facility])   . Preoperative cardiovascular examination     Past Surgical History:  Procedure Laterality Date  . CARDIAC CATHETERIZATION N/A 01/09/2016   Procedure: Left Heart Cath and Coronary Angiography;  Surgeon: Belva Crome, MD;  Location: [facility];  Service: Cardiovascular;  Laterality: N/A;  . EYE SURGERY    . INSERTION OF ILIAC STENT Left 01/09/2016   Procedure: Left Femoral Artery Exposure with INSERTION OF LEFT ILIAC STENT;  Surgeon: Serafina Mitchell, MD;  Location: [facility];  Service: Vascular;  Laterality: Left;  . THYROID SURGERY  2008   Not sure what they did  . TUBAL LIGATION  1991     OB History   None      Home Medications    Prior to Admission medications   Medication Sig Start Date End Date Taking? Authorizing Provider  acetaminophen (TYLENOL) 325 MG tablet Take 2  tablets (650 mg total) by mouth every 6 (six) hours as needed. 01/02/17  Yes Providence Lanius A, PA-C  atorvastatin (LIPITOR) 20 MG tablet Take 2 tablets (40 mg total) by mouth daily. Reported on 12/11/2015 04/27/16  Yes Ward, Delice Bison, DO  Azilsartan-Chlorthalidone (EDARBYCLOR) 40-25 MG TABS Take 1 tablet by mouth daily.   Yes [provider]  clopidogrel (PLAVIX) 75 MG tablet Take 1 tablet (75 mg total) by mouth daily. 03/01/17  Yes Nickel, Sharmon Leyden, NP  levothyroxine (SYNTHROID, LEVOTHROID) 125 MCG tablet Take 1 tablet (125 mcg total) by mouth daily before breakfast. 04/27/16  Yes Ward, Delice Bison, DO  metFORMIN (GLUCOPHAGE) 500 MG tablet Take 1 tablet (500 mg total) by mouth daily with breakfast. Reported on 12/11/2015 03/01/17  Yes Nickel, Sharmon Leyden, NP  PROAIR HFA 108 (90 Base) MCG/ACT inhaler Inhale 2 puffs into the lungs 4 (four) times daily as needed. 09/16/17  Yes [provider]  aspirin EC 81 MG EC tablet Take 1 tablet (81 mg total) by mouth daily. Patient not taking: Reported on 09/25/2017 01/11/16   Dellia Nims, MD  chlorthalidone (HYGROTON) 25 MG tablet Take 1 tablet (25 mg total) by mouth daily. Patient not taking: Reported on 09/25/2017 04/27/16   Ward, Delice Bison, DO  dicyclomine (BENTYL) 20 MG tablet Take 1 tablet (20 mg total) by mouth 3 (three)  times daily before meals. As needed for abdominal cramping Patient not taking: Reported on 09/25/2017 04/27/16   Ward, Delice Bison, DO  furosemide (LASIX) 20 MG tablet Take 1 tablet (20 mg total) by mouth daily. Patient not taking: Reported on 09/25/2017 04/27/16   Ward, Delice Bison, DO  losartan (COZAAR) 100 MG tablet Take 1 tablet (100 mg total) by mouth daily. Patient not taking: Reported on 09/25/2017 04/27/16   Ward, Delice Bison, DO  methocarbamol (ROBAXIN) 500 MG tablet Take 1 tablet (500 mg total) by mouth 2 (two) times daily. Patient not taking: Reported on 09/25/2017 01/02/17   Volanda Napoleon, PA-C  nicotine (NICODERM CQ - DOSED  IN MG/24 HR) 7 mg/24hr patch Place 1 patch (7 mg total) onto the skin daily. Patient not taking: Reported on 09/25/2017 01/11/16   Dellia Nims, MD    Family History Family History  Problem Relation Age of Onset  . Breast cancer Mother   . CAD Sister 58       MI    Social History Social History   Tobacco Use  . Smoking status: Light Tobacco Smoker    Packs/day: 0.25    Types: Cigarettes  . Smokeless tobacco: Never Used  . Tobacco comment: 1/4 pack or less per day according to patient  Substance Use Topics  . Alcohol use: No  . Drug use: Yes    Frequency: 1.0 times per week    Types: Marijuana     Allergies   Bee venom   Review of Systems Review of Systems  Constitutional: Positive for fatigue.  Respiratory: Positive for shortness of breath.   Gastrointestinal: Positive for diarrhea and vomiting.  Endocrine: Positive for polydipsia and polyuria.  Neurological: Positive for weakness and light-headedness.     Physical Exam Updated Vital Signs BP (!) 148/84   Pulse 72   Resp 16   LMP 03/21/2014   SpO2 100%   Physical Exam  Constitutional: She is oriented to person, place, and time. She appears well-developed and well-nourished.  Well appearing.   HENT:  Head: Normocephalic and atraumatic.  Eyes: Conjunctivae are normal. Right eye exhibits no discharge. Left eye exhibits no discharge.  R eye prothesis.   L eye EOMI  Neck: Neck supple.  Cardiovascular: Normal rate, regular rhythm and normal heart sounds.  No murmur heard. Pulmonary/Chest: Effort normal and breath sounds normal. She has no wheezes. She has no rales.  Abdominal: Soft. She exhibits no distension. There is no tenderness.  Musculoskeletal: Normal range of motion. She exhibits no edema.  Neurological: She is oriented to person, place, and time. No cranial nerve deficit.  Equal strength bilaterally upper and lower extremities negative pronator drift. Normal sensation bilaterally. Speech  comprehensible, no slurring. Facial nerve tested and appears grossly normal. Alert and oriented 3. R eye prothesis.    Skin: Skin is warm and dry. No rash noted. She is not diaphoretic.  Psychiatric: She has a normal mood and affect. Her behavior is normal.  Nursing note and vitals reviewed.    ED Treatments / Results  Labs (all labs ordered are listed, but only abnormal results are displayed) Labs Reviewed  BASIC METABOLIC PANEL - Abnormal; Notable for the following components:      Result Value   Creatinine, Ser 1.15 (*)    GFR calc non Af Amer 54 (*)    All other components within normal limits  URINALYSIS, ROUTINE W REFLEX MICROSCOPIC - Abnormal; Notable for the following components:   APPearance CLOUDY (*)  Leukocytes, UA LARGE (*)    Squamous Epithelial / LPF TOO NUMEROUS TO COUNT (*)    All other components within normal limits  CBC  CBG MONITORING, ED  I-STAT BETA HCG BLOOD, ED ([facility], [facility], [facility] ONLY)    EKG None  Radiology Ct Head Wo Contrast  Result Date: 09/25/2017 CLINICAL DATA:  Dizziness and headaches for several days EXAM: CT HEAD WITHOUT CONTRAST TECHNIQUE: Contiguous axial images were obtained from the base of the skull through the vertex without intravenous contrast. COMPARISON:  11/30/2016 FINDINGS: Brain: No evidence of acute infarction, hemorrhage, hydrocephalus, extra-axial collection or mass lesion/mass effect. Vascular: No hyperdense vessel or unexpected calcification. Skull: Normal. Negative for fracture or focal lesion. Sinuses/Orbits: Prosthetic right eye is noted. This is stable from the prior exam. Other: None. IMPRESSION: No acute intracranial abnormality noted. Electronically Signed   By: Inez Catalina M.D.   On: 09/25/2017 17:01    Procedures Procedures (including critical care time)  Medications Ordered in ED Medications  prochlorperazine (COMPAZINE) injection 10 mg (10 mg Intravenous Given 09/25/17 1651)  diphenhydrAMINE (BENADRYL) injection 25  mg (25 mg Intravenous Given 09/25/17 1651)  sodium chloride 0.9 % bolus 1,000 mL (0 mLs Intravenous Stopped 09/25/17 1853)  ketorolac (TORADOL) 15 MG/ML injection 15 mg (15 mg Intravenous Given 09/25/17 1650)  cephALEXin (KEFLEX) capsule 500 mg (500 mg Oral Given 09/25/17 1854)     Initial Impression / Assessment and Plan / ED Course  I have reviewed the triage vital signs and the nursing notes.  Pertinent labs & imaging results that were available during my care of the patient were reviewed by me and considered in my medical decision making (see chart for details).     52 yo female with ho htn, tia, dvt and dm.  4 days ago after a cookout she had mild lightheadedness, and photophobia. These have been constnat for the last 4 days.  Patient vomited several times and has had dirarhea for two days.   Essentially patient feels after she had this "can of cupcake wine" on Friday she is felt when she stands up she gets mildly fuzzyheaded, she feels dehydrated, she also feels like a headache from the pollen.  She feels fatigued.  She feels occasionally lightheaded.  Will check labs.  Will get CAT scan.  Do not suspect central process, do not suspect stroke or other blood pressures given her normal neurologic exam finger to nose and heel to shin and Romberg negative.  Patient has no dizziness upon standing or walking department.   7:12 PM Patient feels much improved.  Urine shows urinary tract infection.  Her headache is completely gone she would like to return home.  She appears very well is ambulatory, taking p.o. at time of discharge.  Will discharge with urinary tract infection information and follow-up with PCP.  Final Clinical Impressions(s) / ED Diagnoses   Final diagnoses:  None    ED Discharge Orders    None       Macarthur Critchley, MD 09/25/17 1913

## 2017-09-25 NOTE — Discharge Instructions (Addendum)
Please make sure you take your antibiotics, stay hydrated.  If you are not feeling improved please return to your primary care physician.

## 2017-09-25 NOTE — ED Triage Notes (Signed)
Pt brought in by [facility] from home for dizziness/headache/blurred vision x4 days. Per EMS [facility] [id number] on 4/18. Pt denies trauma, LOC, and AMS. Pt does have hx of TIA and DVT- is on plavix. Pt is A+Ox4 on arrival to ED.

## 2017-10-03 ENCOUNTER — Telehealth: Payer: Self-pay | Admitting: *Deleted

## 2017-10-03 ENCOUNTER — Encounter: Payer: Self-pay | Admitting: Physician Assistant

## 2017-10-03 NOTE — Telephone Encounter (Signed)
Spoke with pt. She confirmed that she does take Plavix, explained to her that she needs ov with PA before colonoscopy. Made appointment for Amy on 10/17/17 at 2 pm, pt does not want a morning appointment. Notified patient that the nurse visit is cancelled. Pt verbalizes understanding. Told pt to bring list of meds,insurance card and photo I.D.

## 2017-10-17 ENCOUNTER — Ambulatory Visit: Payer: Medicare Other | Admitting: Physician Assistant

## 2017-10-17 ENCOUNTER — Telehealth: Payer: Self-pay | Admitting: *Deleted

## 2017-10-17 NOTE — Telephone Encounter (Signed)
LM For the daughter Norberto Sorenson at [phone].  Asked her to have her mother call us about scheduling an appointment to be seen before her colonoscopy scheduled for 10-26-2017.  She is on Plavix.  Patient had a previsit on 09-27-17.  No showed for her appointment to see Nicoletta Ba PA on Monday 10-17-2017.

## 2017-10-17 NOTE — Telephone Encounter (Signed)
Called to speak to the patient. Someone answered the phone, they asked who I was.  I did say who I was and the name of the practice.  Then it sounded like someone put the phone down.  I kept saying hello and then someone hung up.  I don't know if I was speaking to the patient or not.  I was calling to advise the patient she missed her appointment today and she needs to see a PA due to her being on Plavix.  Her colonoscopy is scheduled for 10-26-2017.

## 2017-10-20 ENCOUNTER — Ambulatory Visit: Payer: Medicare Other | Admitting: Physician Assistant

## 2017-10-20 NOTE — Telephone Encounter (Signed)
Spoke to daughter Norberto Sorenson again and advised her it is best we cancel the colonoscopy for 10-26-2017 because we need Plavix clearance from the cardiologist.  We made her an appointment to see Nicoletta Ba PA for 11-08-2017 at 10:00 am.

## 2017-10-23 IMAGING — CR DG CHEST 2V
2 series · 2 of 2 positions shown · non-contrast
Comparison: September 14, 2010

CLINICAL DATA: Shortness of breath for several days.  Dizziness.

EXAM:
CHEST  2 VIEW

[chest pa]
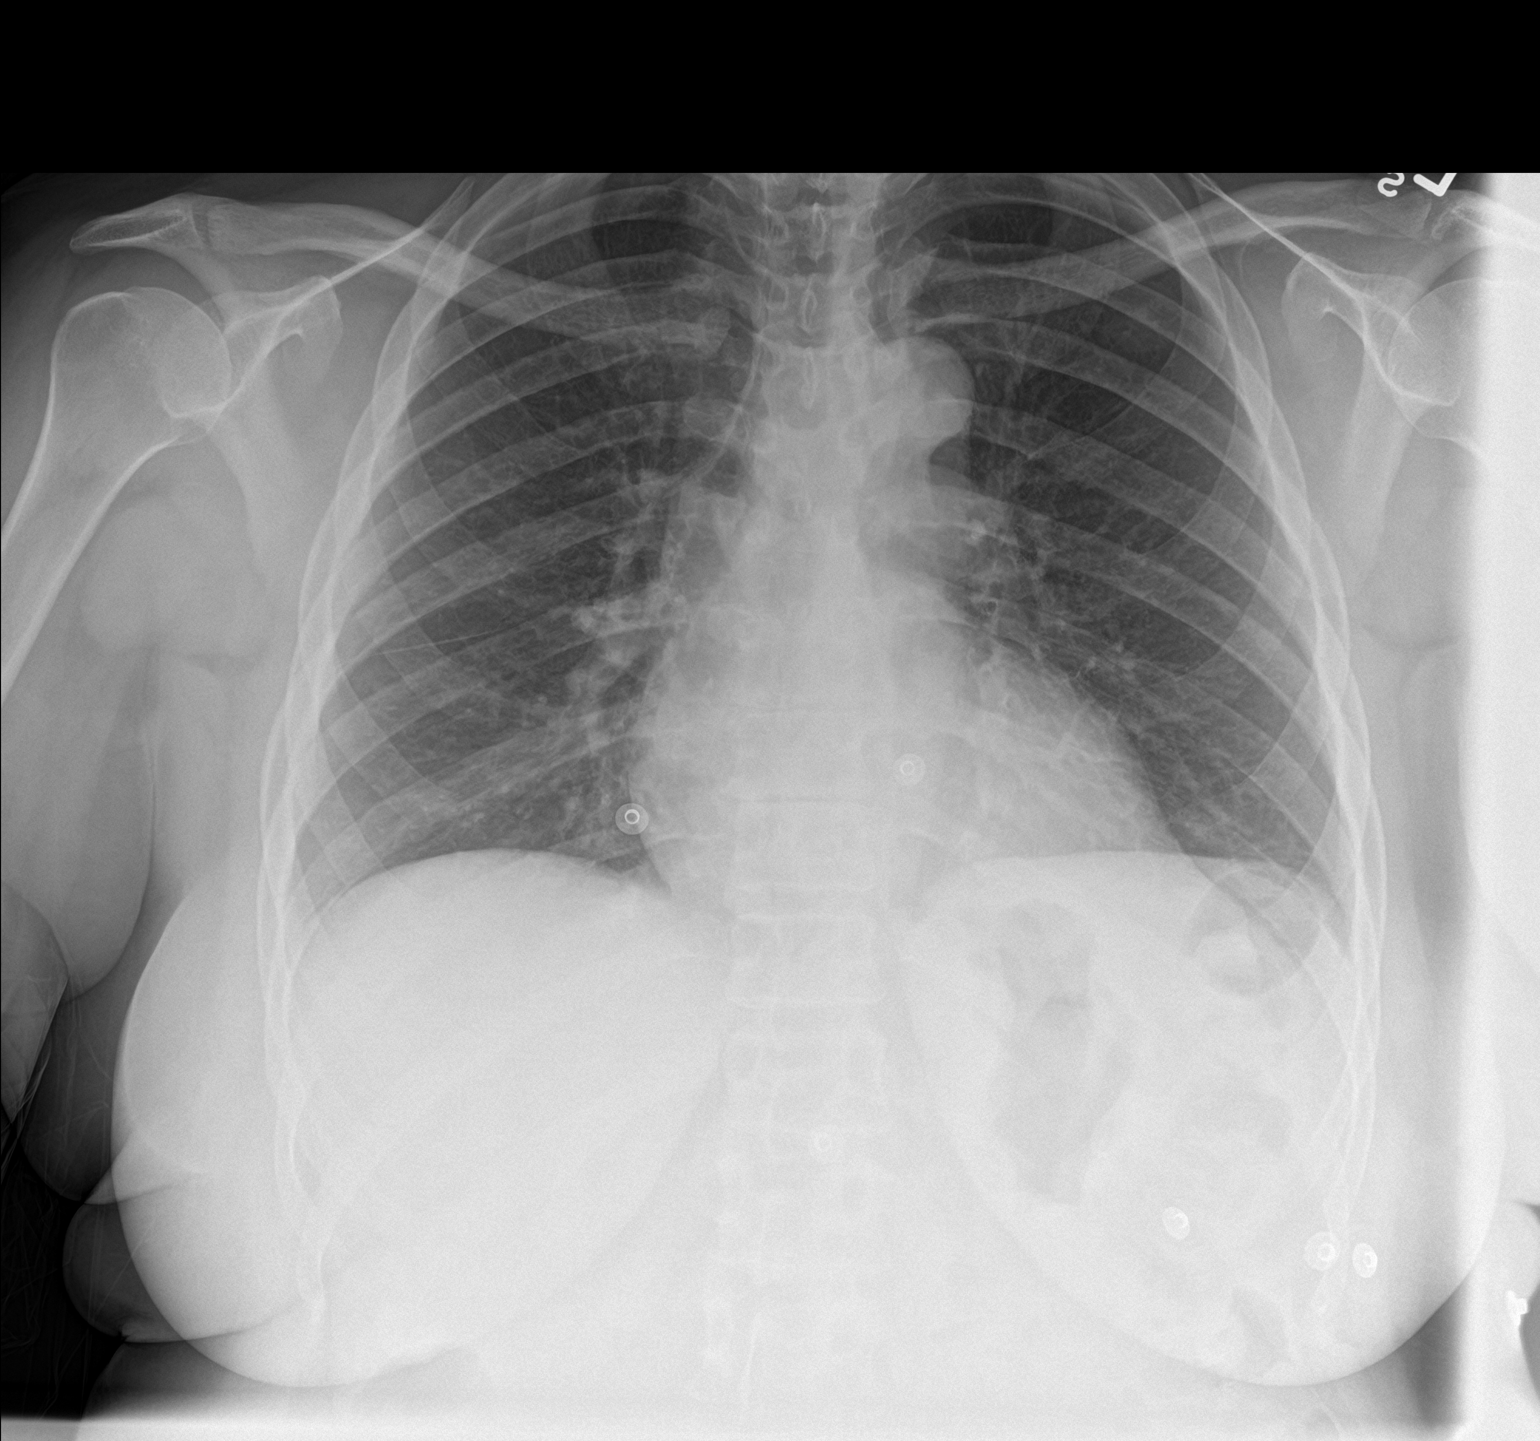

[chest lat]
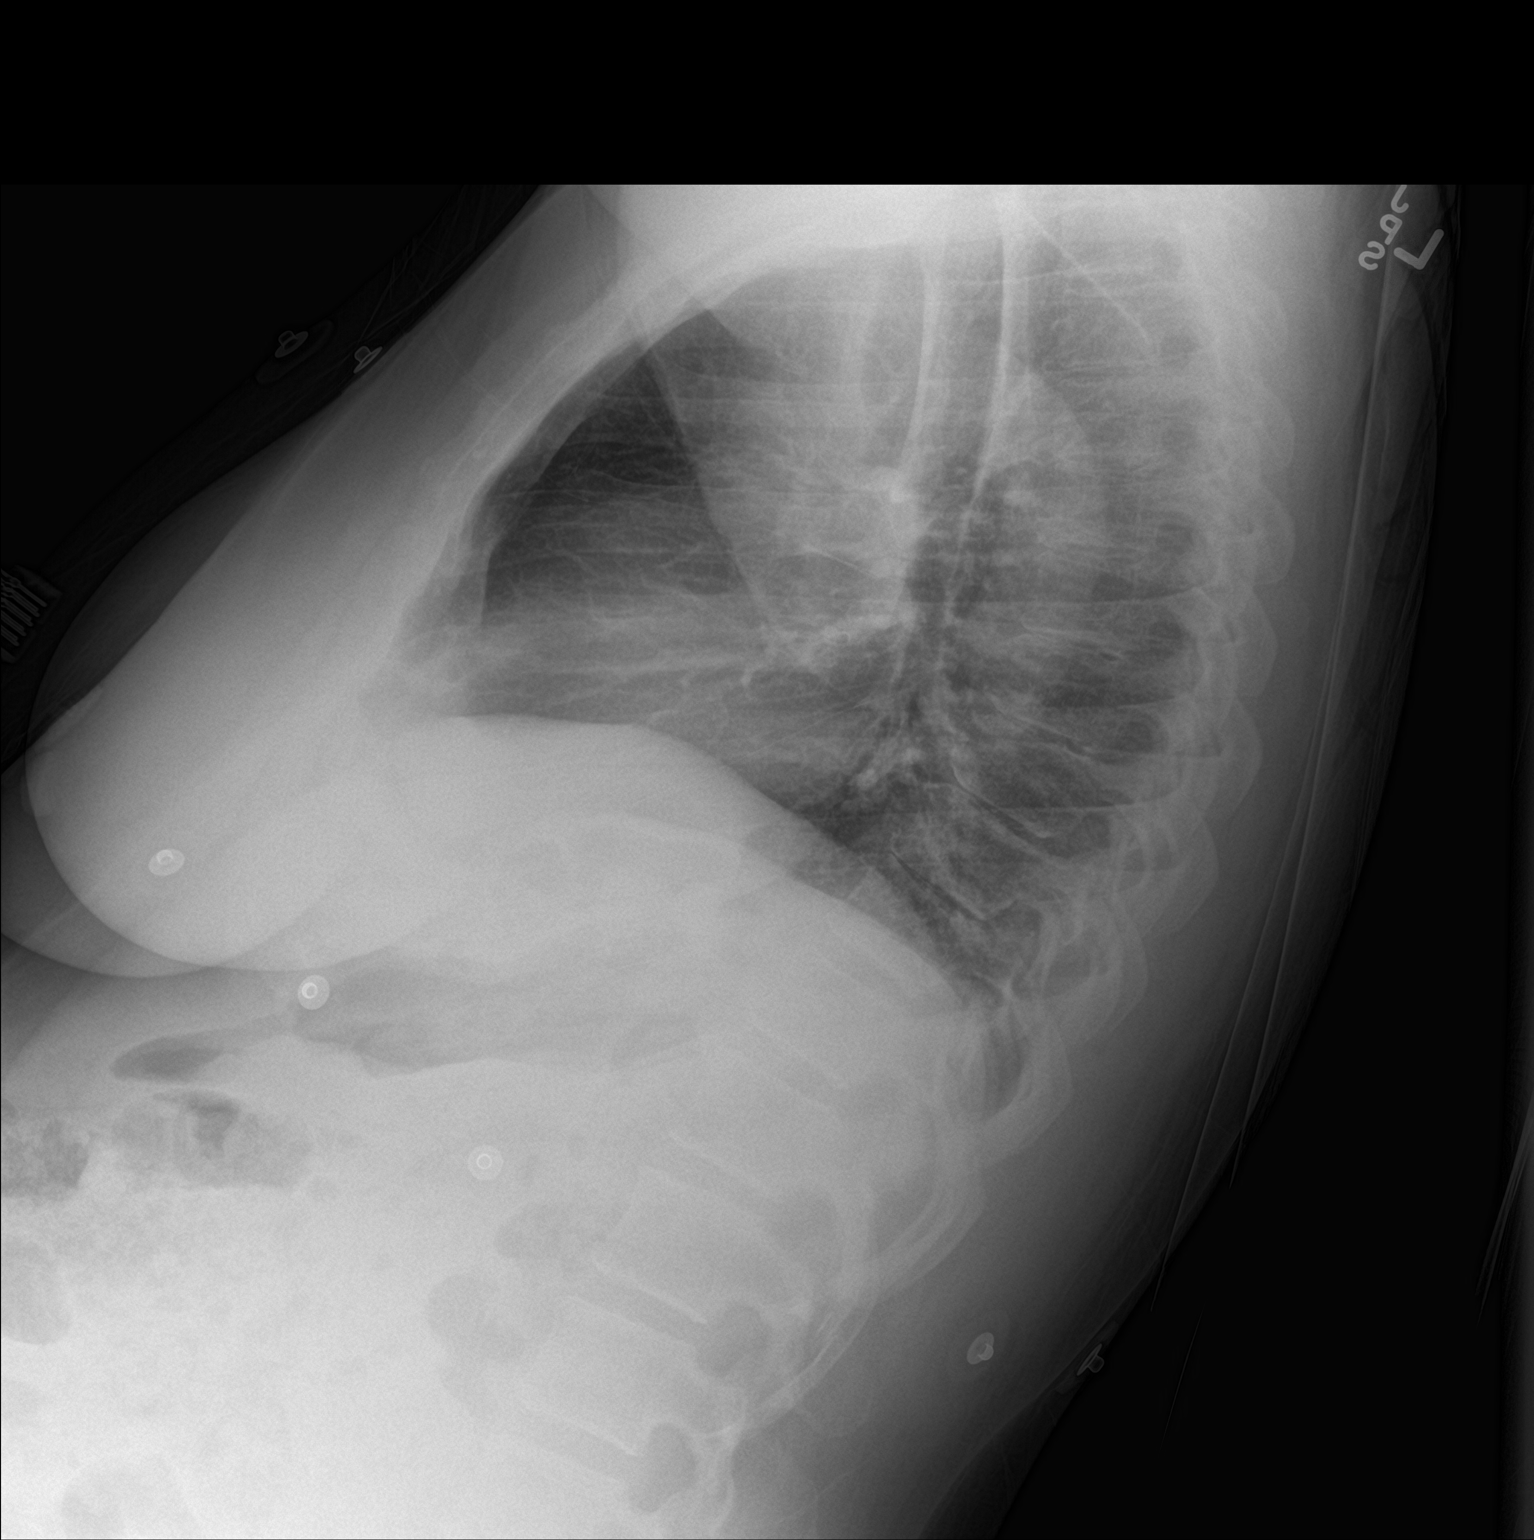

[2 of 2 positions shown; findings below may reference images not displayed]

FINDINGS: There is slight scarring in the left base which is stable. This no
edema or consolidation. Heart size and pulmonary vascularity are
normal. No adenopathy. There is mild degenerative change in the mid
thoracic spine.
IMPRESSION: Slight scarring left base. No edema or consolidation. Stable cardiac
silhouette.

## 2017-10-24 ENCOUNTER — Other Ambulatory Visit: Payer: Self-pay | Admitting: *Deleted

## 2017-10-24 MED ORDER — CLOPIDOGREL BISULFATE 75 MG PO TABS: 75.0000 mg | ORAL_TABLET | Freq: Every day | ORAL | 1 refills | Status: DC

## 2017-10-26 ENCOUNTER — Encounter: Payer: Medicare Other | Admitting: Gastroenterology

## 2017-11-08 ENCOUNTER — Ambulatory Visit: Payer: Medicare Other | Admitting: Physician Assistant

## 2017-11-26 IMAGING — CT CT ANGIO AOBIFEM WO/W CM
1 of 10 series · 4 of 16 positions shown, 5 images · IV contrast (OMNI 350)
Comparison: COMPARISON
MR 07/19/2015, CT 03/03/2015

CLINICAL DATA: Left foot numbness x4 days, left leg pain x2 months

EXAM:
CT ANGIOGRAPHY ABDOMEN AND PELVIS
CT ANGIOGRAPHY BILATERAL LOWER EXTREMITIES
TECHNIQUE: Multidetector CT imaging of the abdomen and pelvis was performed
using the standard protocol during bolus administration of
intravenous contrast. Axial helical CT of bilateral lower
extremities after power injection of intravenous contrast. Coronal
and sagittal reconstructions were generated for vascular evaluation.
Multiplanar reconstructed images including MIPs were obtained and
reviewed to evaluate the vascular anatomy.
CONTRAST:  100 mL Isovue 370 IV

[Series 4: cta runoff (id) · axial · 0.72mm/px · z∈[+337,+1147]mm · 4 of 452 slices shown, 5 images]
[im 91/452  soft-tissue]
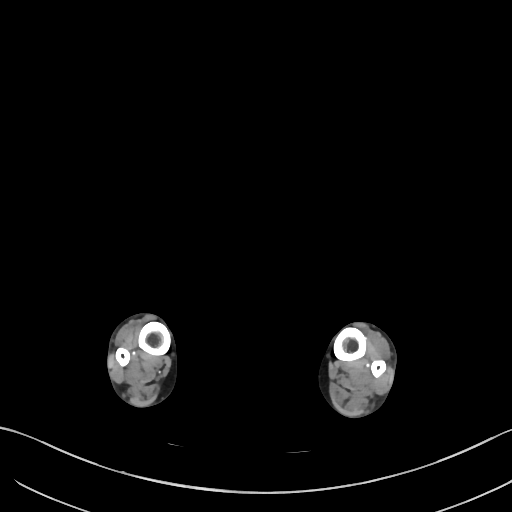
[im 91/452  bone]
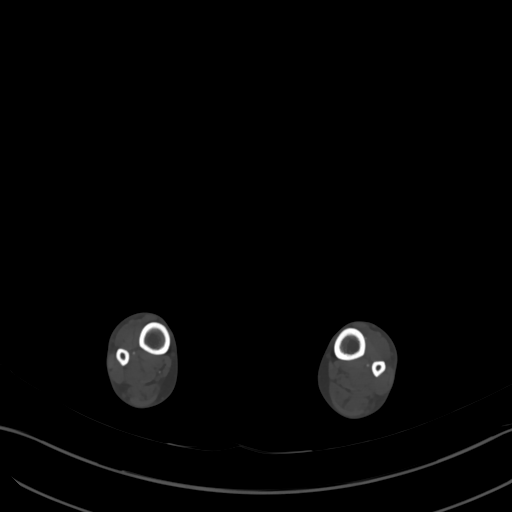
[im 181/452  soft-tissue]
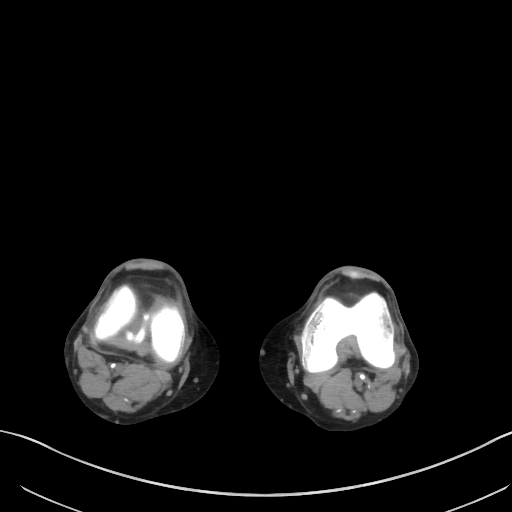
[im 271/452  soft-tissue]
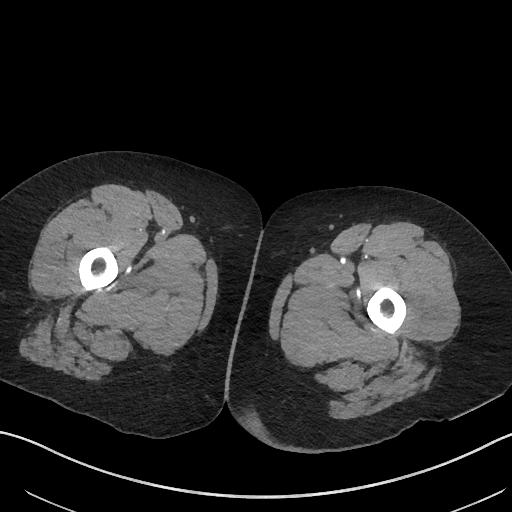
[im 361/452  soft-tissue]
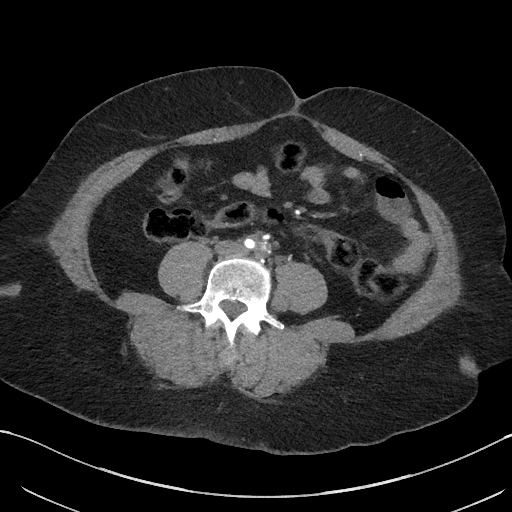

[4 of 16 positions shown; findings below may reference images not displayed]

FINDINGS: ARTERIAL

Aorta: Moderate atheromatous plaque throughout the length of the
aorta without aneurysm, dissection, or or high-grade stenosis.

Celiac axis: Short-segment stenosis at the level of the median
arcuate ligament of the diaphragm, otherwise widely patent.

Superior mesenteric: Noncalcified atheromatous plaque just beyond
the origin resulting in short segment stenosis of at least mild
severity, patent distally with classic distal branch anatomy.

Left renal:           Single, patent

Right renal: Circumferential ostial plaque resulting in short
segment stenosis of at least mild severity, patent distally.

Inferior mesenteric:  Origin stenosis, widely patent distally.

Left iliac: Proximal occlusion of the common iliac artery, with a
string sign through the length of the external iliac artery.
Apparent long segment occlusion of the internal iliac artery.

Right iliac: Mild atheromatous irregularity through the common iliac
artery. Long segment origin occlusion of the internal iliac artery.
Mild plaque through the length of the external iliac artery without
high-grade stenosis.

Left lower extremity: Eccentric mild plaque in the common femoral
artery. Deep femoral branches patent. Plaque through the SFA with
tandem areas of at least mild stenosis.

Popliteal artery is patent. Anterior tibial and peroneal runoff.
Posterior tibial artery occludes at the lower calf level.

Right lower extremity: Common femoral artery has some eccentric
nonocclusive plaque. Deep femoral branches patent. There is moderate
plaque throughout the SFA resulting in long segment moderate
stenosis in the mid and distal SFA, with occlusion at the level of
the adductor hiatus. There is collateral reconstitution of the
popliteal artery above the knee, patent across the knee with
peroneal and posterior tibial runoff. The anterior tibial artery
occludes mid calf.

VENOUS:

Dedicated dedicated venous phase imaging not obtained.

Review of the MIP images confirms the above findings.

NONVASCULAR

Lower chest: Mild dependent atelectasis posteriorly in both lower
lobes. No pleural or pericardial effusion.

Hepatobiliary: No masses or other significant abnormality.

Pancreas: No mass, inflammatory changes, or other significant
abnormality.

Spleen: Within normal limits in size and appearance.

Adrenals/Urinary Tract: 4.2 cm right adrenal adenoma, as previously
characterized. Left adrenal unremarkable. Kidneys unremarkable. No
hydronephrosis or mass. No urolithiasis. Urinary bladder
physiologically distended.

Stomach/Bowel: No evidence of obstruction, inflammatory process, or
abnormal fluid collections. Normal appendix.

Lymphatic: No pathologically enlarged lymph nodes.

Reproductive: NewNo mass or other significant abnormality.

Other: No ascites.  No free air.

Musculoskeletal: Mild degenerative disc disease in the visualized
lower thoracic spine. Mild spondylitic changes in the lower lumbar
spine. Negative for fracture.
IMPRESSION: 1. Progressive arterial occlusive disease through the LEFT iliac
arterial system as detailed above.
2. Two vessel LEFT lower extremity tibial runoff.
3. Progressive long segment arterial occlusive disease in the RIGHT
SFA and proximal popliteal artery, reconstituted distally with 2
vessel tibial runoff.

## 2017-12-27 ENCOUNTER — Other Ambulatory Visit: Payer: Self-pay | Admitting: *Deleted

## 2017-12-27 MED ORDER — CLOPIDOGREL BISULFATE 75 MG PO TABS: 75.0000 mg | ORAL_TABLET | Freq: Every day | ORAL | 1 refills | Status: DC

## 2018-03-10 ENCOUNTER — Other Ambulatory Visit: Payer: Self-pay | Admitting: Family

## 2018-03-10 MED ORDER — CLOPIDOGREL BISULFATE 75 MG PO TABS: 75.0000 mg | ORAL_TABLET | Freq: Every day | ORAL | 1 refills | Status: DC

## 2018-12-05 ENCOUNTER — Other Ambulatory Visit: Payer: Self-pay | Admitting: *Deleted

## 2018-12-05 DIAGNOSIS — Z20822 Contact with and (suspected) exposure to covid-19: Secondary | ICD-10-CM

## 2018-12-11 LAB — NOVEL CORONAVIRUS, NAA: SARS-CoV-2, NAA: NOT DETECTED

## 2018-12-25 ENCOUNTER — Other Ambulatory Visit: Payer: Self-pay | Admitting: Family

## 2018-12-25 DIAGNOSIS — Z1231 Encounter for screening mammogram for malignant neoplasm of breast: Secondary | ICD-10-CM

## 2019-01-15 ENCOUNTER — Other Ambulatory Visit: Payer: Self-pay

## 2019-01-15 DIAGNOSIS — I739 Peripheral vascular disease, unspecified: Secondary | ICD-10-CM

## 2019-01-15 DIAGNOSIS — I745 Embolism and thrombosis of iliac artery: Secondary | ICD-10-CM

## 2019-01-17 ENCOUNTER — Encounter (HOSPITAL_COMMUNITY): Payer: Medicare Other

## 2019-01-17 ENCOUNTER — Ambulatory Visit (HOSPITAL_COMMUNITY): Payer: Medicaid Other | Attending: Family

## 2019-01-17 ENCOUNTER — Ambulatory Visit: Payer: Medicare Other | Admitting: Family

## 2019-02-01 ENCOUNTER — Other Ambulatory Visit: Payer: Self-pay

## 2019-02-01 DIAGNOSIS — I745 Embolism and thrombosis of iliac artery: Secondary | ICD-10-CM

## 2019-02-01 DIAGNOSIS — I739 Peripheral vascular disease, unspecified: Secondary | ICD-10-CM

## 2019-02-02 ENCOUNTER — Telehealth (HOSPITAL_COMMUNITY): Payer: Self-pay

## 2019-02-02 NOTE — Telephone Encounter (Signed)

## 2019-02-05 ENCOUNTER — Ambulatory Visit: Payer: Medicare HMO | Admitting: Family

## 2019-02-05 ENCOUNTER — Ambulatory Visit (HOSPITAL_COMMUNITY): Payer: Medicare HMO

## 2019-02-08 ENCOUNTER — Ambulatory Visit: Payer: Medicare Other

## 2019-02-28 ENCOUNTER — Telehealth (HOSPITAL_COMMUNITY): Payer: Self-pay

## 2019-02-28 NOTE — Telephone Encounter (Signed)

## 2019-03-01 ENCOUNTER — Other Ambulatory Visit: Payer: Self-pay

## 2019-03-01 ENCOUNTER — Ambulatory Visit (HOSPITAL_COMMUNITY)
Admission: RE | Admit: 2019-03-01 | Discharge: 2019-03-01 | Disposition: A | Discharge status: Home / Self Care | Payer: Medicare HMO | Source: Ambulatory Visit | Attending: Family | Admitting: Family

## 2019-03-01 ENCOUNTER — Ambulatory Visit (INDEPENDENT_AMBULATORY_CARE_PROVIDER_SITE_OTHER): Payer: Medicare HMO | Admitting: Family

## 2019-03-01 ENCOUNTER — Encounter: Payer: Self-pay | Admitting: Family

## 2019-03-01 ENCOUNTER — Ambulatory Visit (INDEPENDENT_AMBULATORY_CARE_PROVIDER_SITE_OTHER)
Admission: RE | Admit: 2019-03-01 | Discharge: 2019-03-01 | Disposition: A | Discharge status: Home / Self Care | Payer: Medicare HMO | Source: Ambulatory Visit | Attending: Family | Admitting: Family

## 2019-03-01 VITALS — BP 135/87 | HR 80 | Temp 98.1°F | Resp 14 | Wt 193.8 lb

## 2019-03-01 DIAGNOSIS — I745 Embolism and thrombosis of iliac artery: Secondary | ICD-10-CM | POA: Insufficient documentation

## 2019-03-01 DIAGNOSIS — F17200 Nicotine dependence, unspecified, uncomplicated: Secondary | ICD-10-CM

## 2019-03-01 DIAGNOSIS — I779 Disorder of arteries and arterioles, unspecified: Secondary | ICD-10-CM

## 2019-03-01 DIAGNOSIS — I739 Peripheral vascular disease, unspecified: Secondary | ICD-10-CM | POA: Insufficient documentation

## 2019-03-01 MED ORDER — CLOPIDOGREL BISULFATE 75 MG PO TABS: 75.0000 mg | ORAL_TABLET | Freq: Every day | ORAL | 1 refills | Status: DC
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
## 2019-03-01 NOTE — Patient Instructions (Signed)

## 2019-06-18 ENCOUNTER — Ambulatory Visit: Payer: Medicare HMO | Admitting: Surgery

## 2019-06-18 ENCOUNTER — Other Ambulatory Visit: Payer: Self-pay

## 2019-06-18 ENCOUNTER — Ambulatory Visit (HOSPITAL_COMMUNITY): Payer: Medicare HMO | Attending: Surgery

## 2019-06-18 ENCOUNTER — Ambulatory Visit (HOSPITAL_COMMUNITY): Payer: Medicare HMO

## 2019-06-18 ENCOUNTER — Encounter (HOSPITAL_COMMUNITY): Payer: Medicare HMO

## 2019-06-18 DIAGNOSIS — I745 Embolism and thrombosis of iliac artery: Secondary | ICD-10-CM

## 2019-06-18 DIAGNOSIS — I779 Disorder of arteries and arterioles, unspecified: Secondary | ICD-10-CM

## 2019-07-02 ENCOUNTER — Encounter (HOSPITAL_COMMUNITY): Payer: Self-pay | Admitting: Emergency Medicine

## 2019-07-02 ENCOUNTER — Other Ambulatory Visit: Payer: Self-pay

## 2019-07-02 ENCOUNTER — Emergency Department (HOSPITAL_COMMUNITY)
Admission: EM | Admit: 2019-07-02 | Discharge: 2019-07-02 | Disposition: A | Discharge status: Home / Self Care | Payer: Medicare HMO | Attending: Emergency Medicine | Admitting: Emergency Medicine

## 2019-07-02 DIAGNOSIS — Z7984 Long term (current) use of oral hypoglycemic drugs: Secondary | ICD-10-CM | POA: Insufficient documentation

## 2019-07-02 DIAGNOSIS — I739 Peripheral vascular disease, unspecified: Secondary | ICD-10-CM | POA: Insufficient documentation

## 2019-07-02 DIAGNOSIS — G4489 Other headache syndrome: Secondary | ICD-10-CM | POA: Insufficient documentation

## 2019-07-02 DIAGNOSIS — Z20822 Contact with and (suspected) exposure to covid-19: Secondary | ICD-10-CM | POA: Diagnosis not present

## 2019-07-02 DIAGNOSIS — I10 Essential (primary) hypertension: Secondary | ICD-10-CM | POA: Insufficient documentation

## 2019-07-02 DIAGNOSIS — E119 Type 2 diabetes mellitus without complications: Secondary | ICD-10-CM | POA: Insufficient documentation

## 2019-07-02 DIAGNOSIS — F17210 Nicotine dependence, cigarettes, uncomplicated: Secondary | ICD-10-CM | POA: Insufficient documentation

## 2019-07-02 DIAGNOSIS — R42 Dizziness and giddiness: Secondary | ICD-10-CM | POA: Insufficient documentation

## 2019-07-02 DIAGNOSIS — R519 Headache, unspecified: Secondary | ICD-10-CM | POA: Diagnosis present

## 2019-07-02 DIAGNOSIS — G4459 Other complicated headache syndrome: Secondary | ICD-10-CM

## 2019-07-02 LAB — CBC
HCT: 42 % (ref 36.0–46.0)
Hemoglobin: 13.7 g/dL (ref 12.0–15.0)
MCH: 29.8 pg (ref 26.0–34.0)
MCHC: 32.6 g/dL (ref 30.0–36.0)
MCV: 91.3 fL (ref 80.0–100.0)
Platelets: 343 10*3/uL (ref 150–400)
RBC: 4.6 MIL/uL (ref 3.87–5.11)
RDW: 14.8 % (ref 11.5–15.5)
WBC: 8.4 10*3/uL (ref 4.0–10.5)
nRBC: 0 % (ref 0.0–0.2)

## 2019-07-02 LAB — BASIC METABOLIC PANEL
Anion gap: 13 (ref 5–15)
BUN: 14 mg/dL (ref 6–20)
CO2: 22 mmol/L (ref 22–32)
Calcium: 9.5 mg/dL (ref 8.9–10.3)
Chloride: 106 mmol/L (ref 98–111)
Creatinine, Ser: 1.11 mg/dL — ABNORMAL HIGH (ref 0.44–1.00)
GFR calc Af Amer: >60 mL/min (ref >60)
GFR calc non Af Amer: 57 mL/min — ABNORMAL LOW (ref >60)
Glucose, Bld: 116 mg/dL — ABNORMAL HIGH (ref 70–99)
Potassium: 3.6 mmol/L (ref 3.5–5.1)
Sodium: 141 mmol/L (ref 135–145)

## 2019-07-02 LAB — URINALYSIS, ROUTINE W REFLEX MICROSCOPIC
Bilirubin Urine: NEGATIVE
Glucose, UA: NEGATIVE mg/dL
Hgb urine dipstick: NEGATIVE
Ketones, ur: NEGATIVE mg/dL
Leukocytes,Ua: NEGATIVE
Nitrite: NEGATIVE
Protein, ur: 30 mg/dL — AB
Specific Gravity, Urine: 1.02 (ref 1.005–1.030)
pH: 5 (ref 5.0–8.0)

## 2019-07-02 LAB — POC SARS CORONAVIRUS 2 AG -  ED: SARS Coronavirus 2 Ag: NEGATIVE

## 2019-07-02 LAB — CBG MONITORING, ED: Glucose-Capillary: 113 mg/dL — ABNORMAL HIGH (ref 70–99)

## 2019-07-02 MED ORDER — SODIUM CHLORIDE 0.9% FLUSH
3.0000 mL | Freq: Once | INTRAVENOUS | Status: AC
  Administered 2019-07-02: 3 mL via INTRAVENOUS

## 2019-07-02 MED ORDER — ACETAMINOPHEN 325 MG PO TABS
650.0000 mg | ORAL_TABLET | Freq: Four times a day (QID) | ORAL | Status: DC | PRN
  Administered 2019-07-02: 14:00:00 650 mg via ORAL
  Filled 2019-07-02: qty 2

## 2019-07-02 MED ORDER — HYDROCHLOROTHIAZIDE 25 MG PO TABS
25.0000 mg | ORAL_TABLET | Freq: Every day | ORAL | Status: DC
  Administered 2019-07-02: 16:00:00 25 mg via ORAL
  Filled 2019-07-02: qty 1

## 2019-07-02 NOTE — ED Notes (Signed)
Pt. Called out asking for something for headache

## 2019-07-02 NOTE — ED Triage Notes (Signed)
Pt to triage via [facility] from home.  C/o headache and dizziness for the past few days.  Not taking her BP medication for a few days. No neuro deficits.  BP 198/124 per EMS.  CBG 122.  20g L AC.

## 2019-07-02 NOTE — ED Provider Notes (Signed)
[facility] EMERGENCY DEPARTMENT Provider Note   CSN: [id number] Arrival date & time: 07/02/19  1021     History Chief Complaint  Patient presents with  . Headache  . Dizziness  . Hypertension    Jordan Harvey is a 54 y.o. female.  The history is provided by the patient. No language interpreter was used.  Headache Pain location:  Generalized Radiates to:  Does not radiate Onset quality:  Gradual Duration:  2 days Timing:  Constant Context: not activity   Relieved by:  Nothing Worsened by:  Nothing Associated symptoms: dizziness   Dizziness Associated symptoms: headaches   Hypertension This is a recurrent problem. The current episode started more than 2 days ago. The problem occurs constantly. Associated symptoms include headaches. Nothing relieves the symptoms. She has tried nothing for the symptoms.  Pt reports she lives in [place] and is in [place] for a funeral.  Pt reports she left her blood pressure medication at home.  Pt became worried about blood pressure when she developed a headache.  Pt is unaware of the names of her medications      Past Medical History:  Diagnosis Date  . Cancer of thyroid ([place])   . Diabetes mellitus without complication ([place])   . DVT (deep venous thrombosis) ([place])   . Hypertension   . Thyroid disease   . TIA (transient ischemic attack)   . Vitreous floaters of left eye     Patient Active Problem List   Diagnosis Date Noted  . 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 Diabetes ([place]) 01/08/2016  . HTN (hypertension) 01/08/2016  . Hypothyroidism 01/08/2016  . PVD (peripheral vascular disease) ([place]) 01/08/2016  . Intermittent chest pain 01/08/2016  . HLD (hyperlipidemia) 01/08/2016  . Exertional angina ([facility])   . Preoperative cardiovascular examination     Past Surgical History:  Procedure Laterality Date  . CARDIAC CATHETERIZATION N/A 01/09/2016   Procedure: Left Heart Cath and Coronary Angiography;   Surgeon: Belva Crome, MD;  Location: [facility];  Service: Cardiovascular;  Laterality: N/A;  . EYE SURGERY    . INSERTION OF ILIAC STENT Left 01/09/2016   Procedure: Left Femoral Artery Exposure with INSERTION OF LEFT ILIAC STENT;  Surgeon: Serafina Mitchell, MD;  Location: [facility];  Service: Vascular;  Laterality: Left;  . THYROID SURGERY  2008   Not sure what they did  . TUBAL LIGATION  1991     OB History   No obstetric history on file.     Family History  Problem Relation Age of Onset  . Breast cancer Mother   . CAD Sister 74       MI    Social History   Tobacco Use  . Smoking status: Light Tobacco Smoker    Packs/day: 0.25    Types: Cigarettes  . Smokeless tobacco: Never Used  . Tobacco comment: 1/4 pack or less per day according to patient  Substance Use Topics  . Alcohol use: No  . Drug use: Yes    Frequency: 1.0 times per week    Types: Marijuana    Home Medications Prior to Admission medications   Medication Sig Start Date End Date Taking? Authorizing Provider  acetaminophen (TYLENOL) 325 MG tablet Take 2 tablets (650 mg total) by mouth every 6 (six) hours as needed. 01/02/17   Volanda Napoleon, PA-C  ALPRAZolam Duanne Moron) 0.5 MG tablet Take 0.5 mg by mouth at bedtime as needed for anxiety.  [provider]  aspirin EC 81 MG EC tablet Take 1 tablet (81 mg total) by mouth daily. 01/11/16   Dellia Nims, MD  atorvastatin (LIPITOR) 20 MG tablet Take 2 tablets (40 mg total) by mouth daily. Reported on 12/11/2015 04/27/16   Ward, Delice Bison, DO  Azilsartan-Chlorthalidone (EDARBYCLOR) 40-25 MG TABS Take 1 tablet by mouth daily.    [provider]  chlorthalidone (HYGROTON) 25 MG tablet Take 1 tablet (25 mg total) by mouth daily. 04/27/16   Ward, Delice Bison, DO  clopidogrel (PLAVIX) 75 MG tablet Take 1 tablet (75 mg total) by mouth daily. 03/01/19   Nickel, Sharmon Leyden, NP  dicyclomine (BENTYL) 20 MG tablet Take 1 tablet (20 mg total) by mouth 3 (three)  times daily before meals. As needed for abdominal cramping 04/27/16   Ward, Delice Bison, DO  furosemide (LASIX) 20 MG tablet Take 1 tablet (20 mg total) by mouth daily. 04/27/16   Ward, Delice Bison, DO  levothyroxine (SYNTHROID, LEVOTHROID) 125 MCG tablet Take 1 tablet (125 mcg total) by mouth daily before breakfast. 04/27/16   Ward, Delice Bison, DO  losartan (COZAAR) 100 MG tablet Take 1 tablet (100 mg total) by mouth daily. 04/27/16   Ward, Delice Bison, DO  metFORMIN (GLUCOPHAGE) 500 MG tablet Take 1 tablet (500 mg total) by mouth daily with breakfast. Reported on 12/11/2015 03/01/17   Nickel, Sharmon Leyden, NP  PROAIR HFA 108 (90 Base) MCG/ACT inhaler Inhale 2 puffs into the lungs 4 (four) times daily as needed. 09/16/17   [provider]  QUEtiapine (SEROQUEL) 25 MG tablet Take 25 mg by mouth at bedtime.    [provider]    Allergies    Bee venom  Review of Systems   Review of Systems  Neurological: Positive for dizziness and headaches.  All other systems reviewed and are negative.   Physical Exam Updated Vital Signs BP (!) 164/104   Pulse (!) 59   Temp 98.5 F (36.9 C) (Oral)   Resp 19   LMP 03/21/2014   SpO2 97%   Physical Exam Vitals and nursing note reviewed.  Constitutional:      Appearance: She is well-developed.  HENT:     Head: Normocephalic.  Eyes:     Comments: Right eye prosthetic   Cardiovascular:     Rate and Rhythm: Normal rate and regular rhythm.  Pulmonary:     Effort: Pulmonary effort is normal.  Abdominal:     General: There is no distension.  Musculoskeletal:        General: Normal range of motion.     Cervical back: Normal range of motion.  Skin:    General: Skin is warm.  Neurological:     Mental Status: She is alert and oriented to person, place, and time.  Psychiatric:        Speech: Speech normal.     ED Results / Procedures / Treatments   Labs (all labs ordered are listed, but only abnormal results are displayed) Labs Reviewed    BASIC METABOLIC PANEL - Abnormal; Notable for the following components:      Result Value   Glucose, Bld 116 (*)    Creatinine, Ser 1.11 (*)    GFR calc non Af Amer 57 (*)    All other components within normal limits  URINALYSIS, ROUTINE W REFLEX MICROSCOPIC - Abnormal; Notable for the following components:   APPearance HAZY (*)    Protein, ur 30 (*)    Bacteria, UA  RARE (*)    All other components within normal limits  CBG MONITORING, ED - Abnormal; Notable for the following components:   Glucose-Capillary 113 (*)    All other components within normal limits  CBC  POC SARS CORONAVIRUS 2 AG -  ED    EKG EKG Interpretation  Date/Time:  Monday July 02 2019 10:27:53 EST Ventricular Rate:  70 PR Interval:  178 QRS Duration: 100 QT Interval:  418 QTC Calculation: 451 R Axis:   -30 Text Interpretation: Normal sinus rhythm with sinus arrhythmia Left axis deviation Minimal voltage criteria for LVH, may be normal variant ( Cornell product ) ST & T wave abnormality, consider lateral ischemia Abnormal ECG since last tracing no significant change Non-specific ST-t changes are present. Otherwise no significant change Confirmed by Daleen Bo [id number]) on 07/02/2019 12:25:29 PM   Radiology No results found.  Procedures Procedures (including critical care time)  Medications Ordered in ED Medications  acetaminophen (TYLENOL) tablet 650 mg (650 mg Oral Given 07/02/19 1425)  sodium chloride flush (NS) 0.9 % injection 3 mL (3 mLs Intravenous Given 07/02/19 1321)    ED Course  I have reviewed the triage vital signs and the nursing notes.  Pertinent labs & imaging results that were available during my care of the patient were reviewed by me and considered in my medical decision making (see chart for details).    MDM Rules/Calculators/A&P                      MDM  Rapid covid negative, ua negative, EKg no acute change.  Pt denies chest pain.  Pt has been at a funeral but reports  everyone wore mask.  Final Clinical Impression(s) / ED Diagnoses Final diagnoses:  Hypertension, unspecified type  Other complicated headache syndrome    Rx / DC Orders ED Discharge Orders    None    An After Visit Summary was printed and given to the patient.    Fransico Meadow, Hershal Coria 07/02/19 1548    Daleen Bo, MD 07/03/19 [phone]

## 2019-07-02 NOTE — Discharge Instructions (Addendum)
Take your medication as directed.  See your Physician for recheck. Your covid test is pending

## 2019-07-03 LAB — NOVEL CORONAVIRUS, NAA (HOSP ORDER, SEND-OUT TO REF LAB; TAT 18-24 HRS): SARS-CoV-2, NAA: NOT DETECTED

## 2019-07-18 ENCOUNTER — Encounter: Payer: Self-pay | Admitting: Emergency Medicine

## 2019-07-18 ENCOUNTER — Emergency Department: Payer: Medicare HMO

## 2019-07-18 ENCOUNTER — Other Ambulatory Visit: Payer: Self-pay

## 2019-07-18 ENCOUNTER — Emergency Department
Admission: EM | Admit: 2019-07-18 | Discharge: 2019-07-18 | Disposition: A | Discharge status: Other Acute Inpatient Hospital | Payer: Medicare HMO | Attending: Emergency Medicine | Admitting: Emergency Medicine

## 2019-07-18 DIAGNOSIS — R519 Headache, unspecified: Secondary | ICD-10-CM | POA: Diagnosis present

## 2019-07-18 DIAGNOSIS — Z7902 Long term (current) use of antithrombotics/antiplatelets: Secondary | ICD-10-CM | POA: Insufficient documentation

## 2019-07-18 DIAGNOSIS — Z7984 Long term (current) use of oral hypoglycemic drugs: Secondary | ICD-10-CM | POA: Insufficient documentation

## 2019-07-18 DIAGNOSIS — Z7982 Long term (current) use of aspirin: Secondary | ICD-10-CM | POA: Diagnosis not present

## 2019-07-18 DIAGNOSIS — F17210 Nicotine dependence, cigarettes, uncomplicated: Secondary | ICD-10-CM | POA: Insufficient documentation

## 2019-07-18 DIAGNOSIS — I609 Nontraumatic subarachnoid hemorrhage, unspecified: Secondary | ICD-10-CM | POA: Insufficient documentation

## 2019-07-18 DIAGNOSIS — J449 Chronic obstructive pulmonary disease, unspecified: Secondary | ICD-10-CM | POA: Insufficient documentation

## 2019-07-18 DIAGNOSIS — I10 Essential (primary) hypertension: Secondary | ICD-10-CM | POA: Diagnosis not present

## 2019-07-18 DIAGNOSIS — E119 Type 2 diabetes mellitus without complications: Secondary | ICD-10-CM | POA: Diagnosis not present

## 2019-07-18 DIAGNOSIS — Z79899 Other long term (current) drug therapy: Secondary | ICD-10-CM | POA: Insufficient documentation

## 2019-07-18 DIAGNOSIS — Z8673 Personal history of transient ischemic attack (TIA), and cerebral infarction without residual deficits: Secondary | ICD-10-CM | POA: Diagnosis not present

## 2019-07-18 DIAGNOSIS — Z20822 Contact with and (suspected) exposure to covid-19: Secondary | ICD-10-CM | POA: Insufficient documentation

## 2019-07-18 DIAGNOSIS — Z85850 Personal history of malignant neoplasm of thyroid: Secondary | ICD-10-CM | POA: Diagnosis not present

## 2019-07-18 LAB — URINALYSIS, ROUTINE W REFLEX MICROSCOPIC
Bilirubin Urine: NEGATIVE
Glucose, UA: NEGATIVE mg/dL
Hgb urine dipstick: NEGATIVE
Ketones, ur: NEGATIVE mg/dL
Leukocytes,Ua: NEGATIVE
Nitrite: NEGATIVE
Protein, ur: 30 mg/dL — AB
Specific Gravity, Urine: 1.01 (ref 1.005–1.030)
pH: 6 (ref 5.0–8.0)

## 2019-07-18 LAB — RESPIRATORY PANEL BY RT PCR (FLU A&B, COVID)
Influenza A by PCR: NEGATIVE
Influenza B by PCR: NEGATIVE
SARS Coronavirus 2 by RT PCR: NEGATIVE

## 2019-07-18 LAB — CBC WITH DIFFERENTIAL/PLATELET
Abs Immature Granulocytes: 0.03 10*3/uL (ref 0.00–0.07)
Basophils Absolute: 0.1 10*3/uL (ref 0.0–0.1)
Basophils Relative: 1 %
Eosinophils Absolute: 0.2 10*3/uL (ref 0.0–0.5)
Eosinophils Relative: 2 %
HCT: 36.4 % (ref 36.0–46.0)
Hemoglobin: 12 g/dL (ref 12.0–15.0)
Immature Granulocytes: 0 %
Lymphocytes Relative: 28 %
Lymphs Abs: 2.9 10*3/uL (ref 0.7–4.0)
MCH: 29.9 pg (ref 26.0–34.0)
MCHC: 33 g/dL (ref 30.0–36.0)
MCV: 90.5 fL (ref 80.0–100.0)
Monocytes Absolute: 0.6 10*3/uL (ref 0.1–1.0)
Monocytes Relative: 6 %
Neutro Abs: 6.4 10*3/uL (ref 1.7–7.7)
Neutrophils Relative %: 63 %
Platelets: 386 10*3/uL (ref 150–400)
RBC: 4.02 MIL/uL (ref 3.87–5.11)
RDW: 14.8 % (ref 11.5–15.5)
WBC: 10.1 10*3/uL (ref 4.0–10.5)
nRBC: 0 % (ref 0.0–0.2)

## 2019-07-18 LAB — COMPREHENSIVE METABOLIC PANEL
ALT: 12 U/L (ref 0–44)
AST: 14 U/L — ABNORMAL LOW (ref 15–41)
Albumin: 3.8 g/dL (ref 3.5–5.0)
Alkaline Phosphatase: 70 U/L (ref 38–126)
Anion gap: 9 (ref 5–15)
BUN: 17 mg/dL (ref 6–20)
CO2: 26 mmol/L (ref 22–32)
Calcium: 9.2 mg/dL (ref 8.9–10.3)
Chloride: 104 mmol/L (ref 98–111)
Creatinine, Ser: 0.96 mg/dL (ref 0.44–1.00)
GFR calc Af Amer: >60 mL/min (ref >60)
GFR calc non Af Amer: >60 mL/min (ref >60)
Glucose, Bld: 121 mg/dL — ABNORMAL HIGH (ref 70–99)
Potassium: 3.8 mmol/L (ref 3.5–5.1)
Sodium: 139 mmol/L (ref 135–145)
Total Bilirubin: 0.6 mg/dL (ref 0.3–1.2)
Total Protein: 7.5 g/dL (ref 6.5–8.1)

## 2019-07-18 LAB — PROTIME-INR
INR: 0.9 (ref 0.8–1.2)
Prothrombin Time: 11.5 seconds (ref 11.4–15.2)

## 2019-07-18 LAB — TYPE AND SCREEN
ABO/RH(D): A NEG
Antibody Screen: NEGATIVE

## 2019-07-18 LAB — APTT: aPTT: 33 seconds (ref 24–36)

## 2019-07-18 LAB — TROPONIN I (HIGH SENSITIVITY): Troponin I (High Sensitivity): 11 ng/L (ref <18)

## 2019-07-18 LAB — LIPASE, BLOOD: Lipase: 39 U/L (ref 11–51)

## 2019-07-18 MED ORDER — NICARDIPINE HCL IN NACL 20-0.86 MG/200ML-% IV SOLN
3.0000 mg/h | INTRAVENOUS | Status: DC
  Administered 2019-07-18 (×2): 5 mg/h via INTRAVENOUS
  Filled 2019-07-18 (×2): qty 200

## 2019-07-18 MED ORDER — IOHEXOL 350 MG/ML SOLN
75.0000 mL | Freq: Once | INTRAVENOUS | Status: AC | PRN
  Administered 2019-07-18: 20:00:00 75 mL via INTRAVENOUS

## 2019-07-18 MED ORDER — METOCLOPRAMIDE HCL 5 MG/ML IJ SOLN
10.0000 mg | Freq: Once | INTRAMUSCULAR | Status: AC
  Administered 2019-07-18: 10 mg via INTRAVENOUS
  Filled 2019-07-18: qty 2

## 2019-07-18 MED ORDER — DIPHENHYDRAMINE HCL 50 MG/ML IJ SOLN
12.5000 mg | Freq: Once | INTRAMUSCULAR | Status: AC
  Administered 2019-07-18: 19:00:00 12.5 mg via INTRAVENOUS
  Filled 2019-07-18: qty 1

## 2019-07-18 MED ORDER — SODIUM CHLORIDE 0.9 % IV SOLN
2000.0000 mg | Freq: Once | INTRAVENOUS | Status: AC
  Administered 2019-07-18: 2000 mg via INTRAVENOUS
  Filled 2019-07-18: qty 20

## 2019-07-18 MED ORDER — ACETAMINOPHEN 500 MG PO TABS
1000.0000 mg | ORAL_TABLET | Freq: Once | ORAL | Status: AC
  Administered 2019-07-18: 1000 mg via ORAL
  Filled 2019-07-18: qty 2

## 2019-07-18 NOTE — ED Triage Notes (Signed)
Patient from home via [facility]. Patient states she was dx with sinus infection 2 weeks ago. Patient reports worsening headache since then. Also reports uncontrolled HTN. States she has been compliant with meds.   Given 4mg  zofran by EMS en route.

## 2019-07-18 NOTE — ED Provider Notes (Signed)
[facility] Emergency Department Provider Note  ____________________________________________   First MD Initiated Contact with Patient 07/18/19 1823     (approximate)  I have reviewed the triage vital signs and the nursing notes.   HISTORY  Chief Complaint Hypertension and Headache    HPI Jordan Harvey is a 54 y.o. female with diabetes, hypertension, prior DVT who comes in with headache with elevated blood pressures.  Patient was seen on 1/30 concern for sinusitis.  Patient was prescribed azithromycin and Toradol.  Her blood pressure was also noted to be high in the [id number] systolic.  Patient was supposed to follow-up with her primary care doctor.  Patient states she has an appointment for Friday.  Pt has had recent life stressors she reports. Daughter is in jail for killing her granddaughter. Her doctors are having hard time controlling it per family.   Pt reports steadily increasing headache over one week. The headache starts in the back of neck and moves upward. Not better with azithromycin.  Patient states that is worse with the light.  Patient has pain with palpation over the back of her head.  She denies any fevers, chest pain, shortness of breath, abdominal pain.  Patient states that she is on losartan and she is not sure what the other medication is that she does not have it with her.      Past Medical History:  Diagnosis Date  . Cancer of thyroid ([place])   . Diabetes mellitus without complication ([place])   . DVT (deep venous thrombosis) ([place])   . Hypertension   . Thyroid disease   . TIA (transient ischemic attack)   . Vitreous floaters of left eye     Patient Active Problem List   Diagnosis Date Noted  . 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 Diabetes ([place]) 01/08/2016  . HTN (hypertension) 01/08/2016  . Hypothyroidism 01/08/2016  . PVD (peripheral vascular disease) ([place]) 01/08/2016  . Intermittent chest pain 01/08/2016  . HLD  (hyperlipidemia) 01/08/2016  . Exertional angina (HCC)   . Preoperative cardiovascular examination     Past Surgical History:  Procedure Laterality Date  . CARDIAC CATHETERIZATION N/A 01/09/2016   Procedure: Left Heart Cath and Coronary Angiography;  Surgeon: Belva Crome, MD;  Location: [place] [facility];  Service: Cardiovascular;  Laterality: N/A;  . EYE SURGERY    . INSERTION OF ILIAC STENT Left 01/09/2016   Procedure: Left Femoral Artery Exposure with INSERTION OF LEFT ILIAC STENT;  Surgeon: Serafina Mitchell, MD;  Location: [place];  Service: Vascular;  Laterality: Left;  . THYROID SURGERY  2008   Not sure what they did  . TUBAL LIGATION  1991    Prior to Admission medications   Medication Sig Start Date End Date Taking? Authorizing Provider  acetaminophen (TYLENOL) 325 MG tablet Take 2 tablets (650 mg total) by mouth every 6 (six) hours as needed. 01/02/17   Volanda Napoleon, PA-C  ALPRAZolam Duanne Moron) 0.5 MG tablet Take 0.5 mg by mouth at bedtime as needed for anxiety.    [provider]  aspirin EC 81 MG EC tablet Take 1 tablet (81 mg total) by mouth daily. 01/11/16   Dellia Nims, MD  atorvastatin (LIPITOR) 20 MG tablet Take 2 tablets (40 mg total) by mouth daily. Reported on 12/11/2015 04/27/16   Ward, Delice Bison, DO  Azilsartan-Chlorthalidone (EDARBYCLOR) 40-25 MG TABS Take 1 tablet by mouth daily.    [provider]  chlorthalidone (HYGROTON) 25 MG  tablet Take 1 tablet (25 mg total) by mouth daily. 04/27/16   Ward, Delice Bison, DO  clopidogrel (PLAVIX) 75 MG tablet Take 1 tablet (75 mg total) by mouth daily. 03/01/19   Nickel, Sharmon Leyden, NP  dicyclomine (BENTYL) 20 MG tablet Take 1 tablet (20 mg total) by mouth 3 (three) times daily before meals. As needed for abdominal cramping 04/27/16   Ward, Delice Bison, DO  furosemide (LASIX) 20 MG tablet Take 1 tablet (20 mg total) by mouth daily. 04/27/16   Ward, Delice Bison, DO  levothyroxine (SYNTHROID, LEVOTHROID) 125 MCG tablet Take  1 tablet (125 mcg total) by mouth daily before breakfast. 04/27/16   Ward, Delice Bison, DO  losartan (COZAAR) 100 MG tablet Take 1 tablet (100 mg total) by mouth daily. 04/27/16   Ward, Delice Bison, DO  metFORMIN (GLUCOPHAGE) 500 MG tablet Take 1 tablet (500 mg total) by mouth daily with breakfast. Reported on 12/11/2015 03/01/17   Nickel, Sharmon Leyden, NP  PROAIR HFA 108 (90 Base) MCG/ACT inhaler Inhale 2 puffs into the lungs 4 (four) times daily as needed. 09/16/17   [provider]  QUEtiapine (SEROQUEL) 25 MG tablet Take 25 mg by mouth at bedtime.    [provider]    Allergies Bee venom  Family History  Problem Relation Age of Onset  . Breast cancer Mother   . CAD Sister 67       MI    Social History Social History   Tobacco Use  . Smoking status: Light Tobacco Smoker    Packs/day: 0.25    Types: Cigarettes  . Smokeless tobacco: Never Used  . Tobacco comment: 1/4 pack or less per day according to patient  Substance Use Topics  . Alcohol use: No  . Drug use: Yes    Frequency: 1.0 times per week    Types: Marijuana      Review of Systems Constitutional: No fever/chills Eyes: No visual changes. ENT: No sore throat. Cardiovascular: Denies chest pain. Respiratory: Denies shortness of breath. Gastrointestinal: No abdominal pain.  No nausea, no vomiting.  No diarrhea.  No constipation. Genitourinary: Negative for dysuria. Musculoskeletal: Negative for back pain. Skin: Negative for rash. Neurological: Positive headache, no focal weakness or numbness. All other ROS negative ____________________________________________   PHYSICAL EXAM:  VITAL SIGNS: Blood pressure (!) 196/138, pulse 61, temperature 98.8 F (37.1 C), temperature source Oral, resp. rate 18, height 5\' 3"  (1.6 m), weight 74.8 kg, last menstrual period 03/21/2014, SpO2 97 %.   Constitutional: Alert and oriented.  Patient is crying . Eyes: Conjunctivae are normal. EOMI on the left.  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  Pupils are reactive on the left Head: Atraumatic. Nose: No congestion/rhinnorhea. Mouth/Throat: Mucous membranes are moist.   Neck: No stridor. Trachea Midline. FROM Cardiovascular: Normal rate, regular rhythm. Grossly normal heart sounds.  Good peripheral circulation. Respiratory: Normal respiratory effort.  No retractions. Lungs CTAB. Gastrointestinal: Soft and nontender. No distention. No abdominal bruits.  Musculoskeletal: No lower extremity tenderness nor edema.  No joint effusions. Neurologic:  Normal speech and language. No gross focal neurologic deficits are appreciated.  Cranial nerves II through 12 are intact.  Equal strength in arms and legs Skin:  Skin is warm, dry and intact. No rash noted. Psychiatric: Patient is crying.  Patient states that she is been under a lot of stress. GU: Deferred   ____________________________________________   LABS (all labs ordered are listed, but only abnormal results are displayed)  Labs Reviewed  URINALYSIS, ROUTINE W REFLEX MICROSCOPIC - Abnormal; Notable for the following components:      Result Value   Color, Urine STRAW (*)    APPearance CLEAR (*)    Protein, ur 30 (*)    Bacteria, UA RARE (*)    All other components within normal limits  COMPREHENSIVE METABOLIC PANEL - Abnormal; Notable for the following components:   Glucose, Bld 121 (*)    AST 14 (*)    All other components within normal limits  RESPIRATORY PANEL BY RT PCR (FLU A&B, COVID)  CBC WITH DIFFERENTIAL/PLATELET  LIPASE, BLOOD  PROTIME-INR  APTT  TYPE AND SCREEN  TROPONIN I (HIGH SENSITIVITY)   ____________________________________________   ED ECG REPORT I, Vanessa [place], the attending physician, personally viewed and interpreted this ECG.  EKG is sinus rate of 62, no ST elevation, no T wave inversions, normal intervals ____________________________________________  RADIOLOGY Robert Bellow, personally viewed and evaluated  these images (plain radiographs) as part of my medical decision making, as well as reviewing the written report by the radiologist.  ED MD interpretation:  [facility] on CT   Official radiology report(s): CT Angio Head W or Wo Contrast  Result Date: 07/18/2019 CLINICAL DATA:  Worsening headache. Subarachnoid hemorrhage . Hypertension. EXAM: CT ANGIOGRAPHY HEAD AND NECK TECHNIQUE: Multidetector CT imaging of the head and neck was performed using the standard protocol during bolus administration of intravenous contrast. Multiplanar CT image reconstructions and MIPs were obtained to evaluate the vascular anatomy. Carotid stenosis measurements (when applicable) are obtained utilizing NASCET criteria, using the distal internal carotid diameter as the denominator. CONTRAST:  59mL OMNIPAQUE IOHEXOL 350 MG/ML SOLN COMPARISON:  Head CT same day FINDINGS: CTA NECK FINDINGS Aortic arch: Aortic atherosclerosis. No aneurysm or dissection. Branching pattern is normal. Right carotid system: Common carotid artery widely patent to the bifurcation region. Mild atherosclerotic plaque at the bifurcation and ICA bulb but no stenosis. Cervical ICA widely patent. Left carotid system: Common carotid artery widely patent to the bifurcation. Soft and calcified plaque at the carotid bifurcation and ICA bulb but no stenosis. Cervical ICA tortuous but widely patent. Vertebral arteries: Vertebral artery origins widely patent. Both vertebral arteries widely patent through the cervical region to the foramen magnum. Skeleton: Ordinary cervical spondylosis. Other neck: No mass or lymphadenopathy.  Previous thyroidectomy. Upper chest: Negative.  Minimal emphysema. Review of the MIP images confirms the above findings CTA HEAD FINDINGS Anterior circulation: Both internal carotid arteries patent through the skull base and siphon regions. 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 5 mm posterior communicating artery  aneurysm on the left. 2 mm aneurysm versus infundibulum at the right posterior communicating artery origin. Vessels are otherwise patent and normal. Posterior circulation: Both vertebral arteries patent at the foramen magnum. Both vertebral arteries contribute to the basilar. No basilar stenosis. Posterior circulation branch vessels are normal. Venous sinuses: Patent and normal. Anatomic variants: None significant. 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 2 mm right posterior communicating artery infundibulum versus aneurysm. 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 Electronically Signed   By: Nelson Chimes M.D.   On: 07/18/2019 20:02   CT Head Wo Contrast  Result Date: 07/18/2019 CLINICAL DATA:  Worsening headache, hypertension EXAM: CT HEAD WITHOUT CONTRAST TECHNIQUE: Contiguous axial images were obtained from the base of the skull through the  vertex without intravenous contrast. COMPARISON:  09/25/2017 FINDINGS: Brain: There is blood seen within the basilar cisterns. Exact source not seen on this noncontrast study. No intracerebral hemorrhage. Mild prominence of the lateral ventricles, increasing since prior study compatible with a degree of hydrocephalus. Vascular: No visible abnormality on this noncontrast study. Skull: No acute calvarial abnormality. Sinuses/Orbits: Medial orbital wall old bilateral blowout fractures. No acute findings. Prosthetic right eye and right orbital calcifications, stable. Other: None IMPRESSION: Acute subarachnoid hemorrhage seen within the basilar cisterns. Exact source not identified on this noncontrast study. Recommend CT a head to evaluate for aneurysm. Critical Value/emergent results were called by telephone at the time of interpretation on 07/18/2019 at 7:19 pm to provider [facility] , who verbally acknowledged these results. Electronically Signed   By: Rolm Baptise M.D.   On: 07/18/2019 19:19   CT Angio Neck W and/or Wo Contrast  Result Date: 07/18/2019 CLINICAL DATA:  Worsening headache. Subarachnoid hemorrhage . Hypertension. EXAM: CT ANGIOGRAPHY HEAD AND NECK TECHNIQUE: Multidetector CT imaging of the head and neck was performed using the standard protocol during bolus administration of intravenous contrast. Multiplanar CT image reconstructions and MIPs were obtained to evaluate the vascular anatomy. Carotid stenosis measurements (when applicable) are obtained utilizing NASCET criteria, using the distal internal carotid diameter as the denominator. CONTRAST:  26mL OMNIPAQUE IOHEXOL 350 MG/ML SOLN COMPARISON:  Head CT same day FINDINGS: CTA NECK FINDINGS Aortic arch: Aortic atherosclerosis. No aneurysm or dissection. Branching pattern is normal. Right carotid system: Common carotid artery widely patent to the bifurcation region. Mild atherosclerotic plaque at the bifurcation and ICA bulb but no stenosis. Cervical ICA widely patent. Left carotid system: Common carotid artery widely patent to the bifurcation. Soft and calcified plaque at the carotid bifurcation and ICA bulb but no stenosis. Cervical ICA tortuous but widely patent. Vertebral arteries: Vertebral artery origins widely patent. Both vertebral arteries widely patent through the cervical region to the foramen magnum. Skeleton: Ordinary cervical spondylosis. Other neck: No mass or lymphadenopathy.  Previous thyroidectomy. Upper chest: Negative.  Minimal emphysema. Review of the MIP images confirms the above findings CTA HEAD FINDINGS Anterior circulation: Both internal carotid arteries patent through the skull base and siphon regions. 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 5 mm posterior communicating artery aneurysm on the left. 2 mm aneurysm versus infundibulum at the right posterior communicating artery origin. Vessels are otherwise patent and normal. Posterior  circulation: Both vertebral arteries patent at the foramen magnum. Both vertebral arteries contribute to the basilar. No basilar stenosis. Posterior circulation branch vessels are normal. Venous sinuses: Patent and normal. Anatomic variants: None significant. 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 2 mm right posterior communicating artery infundibulum versus aneurysm. 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 Electronically Signed   By: Nelson Chimes M.D.   On: 07/18/2019 20:02    ____________________________________________   PROCEDURES  Procedure(s) performed (including Critical Care):  .Critical Care Performed by: Vanessa [place], MD Authorized by: Vanessa [place], MD   Critical care provider statement:    Critical care time (minutes):  35   Critical care was necessary to treat or prevent imminent or life-threatening deterioration of the following conditions:  CNS failure or compromise   Critical care was time spent personally by me on the following activities:  Discussions with consultants, evaluation of patient's response to  treatment, examination of patient, ordering and performing treatments and interventions, ordering and review of laboratory studies, ordering and review of radiographic studies, pulse oximetry, re-evaluation of patient's condition, obtaining history from patient or surrogate and review of old charts     ____________________________________________   INITIAL IMPRESSION / ASSESSMENT AND PLAN / ED COURSE  Lizzie Gitchell was evaluated in Emergency Department on 07/18/2019 for the symptoms described in the history of present illness. She was evaluated in the context of the global COVID-19 pandemic, which necessitated consideration that the patient might be at risk for infection with the SARS-CoV-2 virus that causes COVID-19. Institutional protocols and  algorithms that pertain to the evaluation of patients at risk for COVID-19 are in a state of rapid change based on information released by regulatory bodies including the CDC and federal and state organizations. These policies and algorithms were followed during the patient's care in the ED.    Patient is a 54 year old with known elevated blood pressure who is follow-up on Friday who comes in with headaches.  Patient is crying stated that she is been under a lot of stress recently.  The headache has been gradually worsening over the past week.  This could be a migraine versus hemorrhage from the her hypertension.  Given her hypertension will get CT head and ensure there is no evidence of intracranial hemorrhage versus tumor.  No fever to suggest meningitis.  CT scan is concerning for new acute subarachnoid hemorrhage.  Will get CTA.  We will start patient on nicardipine drip with a blood pressure goal of less than 160.    DW Dr. Cari Caraway from [place] and recommends transfer to [facility] ICU.   D/w the ICU team at [facility], patient was accepted by Dr.Laskowitz for ED to ED transfer.  Will keep blood pressures less than 160, loaded with 2 g of IV Keppra.  ____________________________________________   FINAL CLINICAL IMPRESSION(S) / ED DIAGNOSES   Final diagnoses:  Subarachnoid hemorrhage ([place])      MEDICATIONS GIVEN DURING THIS VISIT:  Medications  nicardipine (CARDENE) 20mg  in 0.86% saline 238ml IV infusion (0.1 mg/ml) (5 mg/hr Intravenous New Bag/Given 07/18/19 1952)  levETIRAcetam (KEPPRA) 2,000 mg in sodium chloride 0.9 % 250 mL IVPB (has no administration in time range)  acetaminophen (TYLENOL) tablet 1,000 mg (1,000 mg Oral Given 07/18/19 1849)  metoCLOPramide (REGLAN) injection 10 mg (10 mg Intravenous Given 07/18/19 1852)  diphenhydrAMINE (BENADRYL) injection 12.5 mg (12.5 mg Intravenous Given 07/18/19 1851)  iohexol (OMNIPAQUE) 350 MG/ML injection 75 mL (75 mLs Intravenous Contrast Given  07/18/19 1939)     ED Discharge Orders    None       Note:  This document was prepared using Dragon voice recognition software and may include unintentional dictation errors.   Vanessa [place], MD 07/18/19 2103

## 2019-08-20 ENCOUNTER — Ambulatory Visit: Payer: Medicare HMO | Admitting: Surgery

## 2019-08-20 ENCOUNTER — Encounter (HOSPITAL_COMMUNITY): Payer: Medicare HMO

## 2019-09-04 ENCOUNTER — Encounter: Payer: Self-pay | Admitting: Emergency Medicine

## 2019-09-04 ENCOUNTER — Emergency Department
Admission: EM | Admit: 2019-09-04 | Discharge: 2019-09-04 | Disposition: A | Discharge status: Home / Self Care | Payer: Medicare HMO | Attending: Student | Admitting: Student

## 2019-09-04 ENCOUNTER — Emergency Department: Payer: Medicare HMO

## 2019-09-04 ENCOUNTER — Other Ambulatory Visit: Payer: Self-pay

## 2019-09-04 DIAGNOSIS — Z7902 Long term (current) use of antithrombotics/antiplatelets: Secondary | ICD-10-CM | POA: Insufficient documentation

## 2019-09-04 DIAGNOSIS — R0602 Shortness of breath: Secondary | ICD-10-CM | POA: Insufficient documentation

## 2019-09-04 DIAGNOSIS — Z7982 Long term (current) use of aspirin: Secondary | ICD-10-CM | POA: Diagnosis not present

## 2019-09-04 DIAGNOSIS — R109 Unspecified abdominal pain: Secondary | ICD-10-CM | POA: Diagnosis present

## 2019-09-04 DIAGNOSIS — I10 Essential (primary) hypertension: Secondary | ICD-10-CM | POA: Insufficient documentation

## 2019-09-04 DIAGNOSIS — E119 Type 2 diabetes mellitus without complications: Secondary | ICD-10-CM | POA: Insufficient documentation

## 2019-09-04 DIAGNOSIS — Z85850 Personal history of malignant neoplasm of thyroid: Secondary | ICD-10-CM | POA: Insufficient documentation

## 2019-09-04 DIAGNOSIS — R079 Chest pain, unspecified: Secondary | ICD-10-CM | POA: Insufficient documentation

## 2019-09-04 DIAGNOSIS — Z7984 Long term (current) use of oral hypoglycemic drugs: Secondary | ICD-10-CM | POA: Insufficient documentation

## 2019-09-04 DIAGNOSIS — F17210 Nicotine dependence, cigarettes, uncomplicated: Secondary | ICD-10-CM | POA: Insufficient documentation

## 2019-09-04 DIAGNOSIS — Z79899 Other long term (current) drug therapy: Secondary | ICD-10-CM | POA: Diagnosis not present

## 2019-09-04 DIAGNOSIS — E890 Postprocedural hypothyroidism: Secondary | ICD-10-CM | POA: Insufficient documentation

## 2019-09-04 LAB — CBC WITH DIFFERENTIAL/PLATELET
Abs Immature Granulocytes: 0.02 10*3/uL (ref 0.00–0.07)
Basophils Absolute: 0.1 10*3/uL (ref 0.0–0.1)
Basophils Relative: 1 %
Eosinophils Absolute: 0.1 10*3/uL (ref 0.0–0.5)
Eosinophils Relative: 2 %
HCT: 38.1 % (ref 36.0–46.0)
Hemoglobin: 12.3 g/dL (ref 12.0–15.0)
Immature Granulocytes: 0 %
Lymphocytes Relative: 26 %
Lymphs Abs: 2 10*3/uL (ref 0.7–4.0)
MCH: 29.1 pg (ref 26.0–34.0)
MCHC: 32.3 g/dL (ref 30.0–36.0)
MCV: 90.1 fL (ref 80.0–100.0)
Monocytes Absolute: 0.5 10*3/uL (ref 0.1–1.0)
Monocytes Relative: 6 %
Neutro Abs: 5 10*3/uL (ref 1.7–7.7)
Neutrophils Relative %: 65 %
Platelets: 343 10*3/uL (ref 150–400)
RBC: 4.23 MIL/uL (ref 3.87–5.11)
RDW: 14.1 % (ref 11.5–15.5)
WBC: 7.7 10*3/uL (ref 4.0–10.5)
nRBC: 0 % (ref 0.0–0.2)

## 2019-09-04 LAB — URINALYSIS, COMPLETE (UACMP) WITH MICROSCOPIC
Bacteria, UA: NONE SEEN
Bilirubin Urine: NEGATIVE
Glucose, UA: NEGATIVE mg/dL
Hgb urine dipstick: NEGATIVE
Ketones, ur: NEGATIVE mg/dL
Nitrite: NEGATIVE
Protein, ur: NEGATIVE mg/dL
Specific Gravity, Urine: 1.014 (ref 1.005–1.030)
pH: 7 (ref 5.0–8.0)

## 2019-09-04 LAB — COMPREHENSIVE METABOLIC PANEL
ALT: 14 U/L (ref 0–44)
AST: 14 U/L — ABNORMAL LOW (ref 15–41)
Albumin: 4 g/dL (ref 3.5–5.0)
Alkaline Phosphatase: 98 U/L (ref 38–126)
Anion gap: 9 (ref 5–15)
BUN: 12 mg/dL (ref 6–20)
CO2: 25 mmol/L (ref 22–32)
Calcium: 9.5 mg/dL (ref 8.9–10.3)
Chloride: 106 mmol/L (ref 98–111)
Creatinine, Ser: 0.88 mg/dL (ref 0.44–1.00)
GFR calc Af Amer: >60 mL/min (ref >60)
GFR calc non Af Amer: >60 mL/min (ref >60)
Glucose, Bld: 99 mg/dL (ref 70–99)
Potassium: 3.6 mmol/L (ref 3.5–5.1)
Sodium: 140 mmol/L (ref 135–145)
Total Bilirubin: 0.6 mg/dL (ref 0.3–1.2)
Total Protein: 7.5 g/dL (ref 6.5–8.1)

## 2019-09-04 LAB — TROPONIN I (HIGH SENSITIVITY)
Troponin I (High Sensitivity): 10 ng/L (ref <18)
Troponin I (High Sensitivity): 11 ng/L (ref <18)

## 2019-09-04 MED ORDER — IOHEXOL 350 MG/ML SOLN
100.0000 mL | Freq: Once | INTRAVENOUS | Status: AC | PRN
  Administered 2019-09-04: 100 mL via INTRAVENOUS

## 2019-09-04 MED ORDER — LIDOCAINE 5 % EX PTCH: 1.0000 | MEDICATED_PATCH | CUTANEOUS | Status: DC

## 2019-09-04 MED ORDER — CARVEDILOL 6.25 MG PO TABS: 6.2500 mg | ORAL_TABLET | Freq: Two times a day (BID) | ORAL | 0 refills | Status: DC

## 2019-09-04 MED ORDER — CARVEDILOL 6.25 MG PO TABS: 6.2500 mg | ORAL_TABLET | Freq: Two times a day (BID) | ORAL | Status: DC

## 2019-09-04 MED ORDER — CARVEDILOL 6.25 MG PO TABS
6.2500 mg | ORAL_TABLET | Freq: Once | ORAL | Status: AC
  Administered 2019-09-04: 6.25 mg via ORAL

## 2019-09-04 MED ORDER — KETOROLAC TROMETHAMINE 30 MG/ML IJ SOLN: 15.0000 mg | Freq: Once | INTRAMUSCULAR | Status: DC

## 2019-09-04 MED ORDER — FENTANYL CITRATE (PF) 100 MCG/2ML IJ SOLN
50.0000 ug | Freq: Once | INTRAMUSCULAR | Status: AC
  Administered 2019-09-04: 50 ug via INTRAVENOUS
  Filled 2019-09-04: qty 2

## 2019-09-04 NOTE — ED Notes (Addendum)
Pt was seen in this ED in Feb and was transferred to [facility] for brain surgery r/t aneurysm - pt started Saturday with left flank pain that radiates into spine - pt also hypertensive and states she took her medication today as prescribed  - pt reports BP has been high for the last few days   Pt reports that earlier today she had some chest pain and shob that has since resolved

## 2019-09-04 NOTE — Discharge Instructions (Addendum)
For your blood pressure - stop your LOSARTAN. Start taking the COREG (carvedilol) for your blood pressure.  Follow-up with your primary care doctor to review your ER visit and follow-up on your medications. Return to the emergency department for any new or worsening symptoms.

## 2019-09-04 NOTE — ED Provider Notes (Signed)
[facility] Emergency Department Provider Note  ____________________________________________   First MD Initiated Contact with Patient 09/04/19 1158     (approximate)  I have reviewed the triage vital signs and the nursing notes.  History  Chief Complaint Flank Pain    HPI Jordan Harvey is a 54 y.o. 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 Pain has been ongoing and constant x 3 days. Sharp and aching. 8/10 in severity. No radiation. Worsened with twisting or positional changes. Was playing with her grandchild when symptoms first started, but denies any preceding heavy lifting or injury. No apparent alleviating components. Denies any associated fevers, nausea, vomiting, diarrhea. No dysuria, hematuria, or hx of kidney stones. Had some mild chest pain and SOB earlier today that has since resolved. None currently. No numbness or tingling. On arrival to the ED her BP is elevated. She states during her hospitalization at [facility] (2/10 to 2/25) her BP was better controlled. On chart review it seems that at [facility] they were able to control her BP better with Coreg (as opposed to the losartan that she was on previously). On review of her prescriptions that she brought in today, it appears this switch hasn't been made as an outpatient and she remains only on losartan for BP. She thinks this is the likely etiology of her elevated BP today.   Past Medical Hx Past Medical History:  Diagnosis Date  . Cancer of thyroid ([place])   . Diabetes mellitus without complication ([place])   . DVT (deep venous thrombosis) ([place])   . Hypertension   . Thyroid disease   . TIA (transient ischemic attack)   . Vitreous floaters of left eye     Problem List Patient Active Problem List   Diagnosis Date Noted  . 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 Diabetes ([place])  01/08/2016  . HTN (hypertension) 01/08/2016  . Hypothyroidism 01/08/2016  . PVD (peripheral vascular disease) ([place]) 01/08/2016  . Intermittent chest pain 01/08/2016  . HLD (hyperlipidemia) 01/08/2016  . Exertional angina (HCC)   . Preoperative cardiovascular examination     Past Surgical Hx Past Surgical History:  Procedure Laterality Date  . CARDIAC CATHETERIZATION N/A 01/09/2016   Procedure: Left Heart Cath and Coronary Angiography;  Surgeon: Belva Crome, MD;  Location: [place] [facility];  Service: Cardiovascular;  Laterality: N/A;  . EYE SURGERY    . INSERTION OF ILIAC STENT Left 01/09/2016   Procedure: Left Femoral Artery Exposure with INSERTION OF LEFT ILIAC STENT;  Surgeon: Serafina Mitchell, MD;  Location: [place];  Service: Vascular;  Laterality: Left;  . THYROID SURGERY  2008   Not sure what they did  . TUBAL LIGATION  1991    Medications Prior to Admission medications   Medication Sig Start Date End Date Taking? Authorizing Provider  acetaminophen (TYLENOL) 325 MG tablet Take 2 tablets (650 mg total) by mouth every 6 (six) hours as needed. 01/02/17   Volanda Napoleon, PA-C  ALPRAZolam Duanne Moron) 0.5 MG tablet Take 0.5 mg by mouth at bedtime as needed for anxiety.    [provider]  aspirin EC 81 MG EC tablet Take 1 tablet (81 mg total) by mouth daily. 01/11/16   Dellia Nims, MD  atorvastatin (LIPITOR) 20 MG tablet Take 2 tablets (40 mg total) by mouth daily. Reported on 12/11/2015  04/27/16   Ward, Delice Bison, DO  Azilsartan-Chlorthalidone (EDARBYCLOR) 40-25 MG TABS Take 1 tablet by mouth daily.    [provider]  chlorthalidone (HYGROTON) 25 MG tablet Take 1 tablet (25 mg total) by mouth daily. 04/27/16   Ward, Delice Bison, DO  clopidogrel (PLAVIX) 75 MG tablet Take 1 tablet (75 mg total) by mouth daily. 03/01/19   Nickel, Sharmon Leyden, NP  dicyclomine (BENTYL) 20 MG tablet Take 1 tablet (20 mg total) by mouth 3 (three) times daily before meals. As needed for abdominal  cramping 04/27/16   Ward, Delice Bison, DO  furosemide (LASIX) 20 MG tablet Take 1 tablet (20 mg total) by mouth daily. 04/27/16   Ward, Delice Bison, DO  levothyroxine (SYNTHROID, LEVOTHROID) 125 MCG tablet Take 1 tablet (125 mcg total) by mouth daily before breakfast. 04/27/16   Ward, Delice Bison, DO  losartan (COZAAR) 100 MG tablet Take 1 tablet (100 mg total) by mouth daily. 04/27/16   Ward, Delice Bison, DO  metFORMIN (GLUCOPHAGE) 500 MG tablet Take 1 tablet (500 mg total) by mouth daily with breakfast. Reported on 12/11/2015 03/01/17   Nickel, Sharmon Leyden, NP  PROAIR HFA 108 (90 Base) MCG/ACT inhaler Inhale 2 puffs into the lungs 4 (four) times daily as needed. 09/16/17   [provider]  QUEtiapine (SEROQUEL) 25 MG tablet Take 25 mg by mouth at bedtime.    [provider]    Allergies Bee venom  Family Hx Family History  Problem Relation Age of Onset  . Breast cancer Mother   . CAD Sister 67       MI    Social Hx Social History   Tobacco Use  . Smoking status: Light Tobacco Smoker    Packs/day: 0.25    Types: Cigarettes  . Smokeless tobacco: Never Used  . Tobacco comment: 1/4 pack or less per day according to patient  Substance Use Topics  . Alcohol use: No  . Drug use: Yes    Frequency: 1.0 times per week    Types: Marijuana     Review of Systems  Constitutional: Negative for fever. Negative for chills. Eyes: Negative for visual changes. ENT: Negative for sore throat. Cardiovascular: + for chest pain. Respiratory: + for shortness of breath. Gastrointestinal: Negative for nausea. Negative for vomiting. + flank pain Genitourinary: Negative for dysuria. Musculoskeletal: Negative for leg swelling. Skin: Negative for rash. Neurological: Negative for headaches.   Physical Exam  Vital Signs: ED Triage Vitals  Enc Vitals Group     BP 09/04/19 1150 (!) 222/128     Pulse Rate 09/04/19 1149 87     Resp 09/04/19 1149 16     Temp 09/04/19 1149 98.5 F (36.9 C)       Temp Source 09/04/19 1149 Oral     SpO2 09/04/19 1149 96 %     Weight 09/04/19 1148 180 lb (81.6 kg)     Height 09/04/19 1148 5\' 3"  (1.6 m)     Head Circumference --      Peak Flow --      Pain Score 09/04/19 1148 8     Pain Loc --      Pain Edu? --      Excl. in [place]? --     Constitutional: Alert and oriented. Well appearing. NAD.  Head: Normocephalic. Atraumatic. Eyes: Conjunctivae clear. Sclera anicteric. Pupils equal and symmetric. Nose: No masses or lesions. No congestion or rhinorrhea. Mouth/Throat: Wearing mask.  Neck: No stridor. Trachea midline.  Cardiovascular: Normal rate, regular rhythm. Extremities well perfused. Equal and symmetric radial and DP pulses.  Respiratory: Normal respiratory effort.  Lungs CTAB. Gastrointestinal: Soft. Non-distended. Non-tender. Left flank TTP. No rashes or lesions. Genitourinary: Deferred. Musculoskeletal: No lower extremity edema. No deformities. Neurologic:  Normal speech and language. No gross focal or lateralizing neurologic deficits are appreciated.  Skin: Skin is warm, dry and intact. No rash noted. Psychiatric: Mood and affect are appropriate for situation.  EKG  Personally reviewed and interpreted by myself.   Rate: 70 Rhythm: sinus Axis: LAD Intervals: WNL No STEMI    Radiology  CT dissection  IMPRESSION:  Negative for aortic dissection or aneurysm. No acute abnormality or finding to explain the patient's symptoms. Trace bilateral pleural effusions noted.  Emphysema.  Atherosclerosis.  Unchanged right adrenal adenoma.  Unchanged small fat containing umbilical hernia.   CXR IMPRESSION:  No acute abnormalities.   Procedures  Procedure(s) performed (including critical care):  Procedures   Initial Impression / Assessment and Plan / MDM / ED Course  54 y.o. female who presents to the ED for left flank pain. Also with episode of chest pain and SOB this AM which is now resolved. Hypertensive on arrival,  suspect due to medication mixup.  Ddx: nephrolithiasis, dissection (given earlier chest pain + elevated blood pressure), occlusion of L sided iliac stent, atypical lower lobe PNA, PVD involving spleen, MSK.  No rashes or lesions or hyperesthesias on exam to suggest shingles.  Will plan for labs, EKG, imaging   Clinical Course as of Sep 04 1714  Tue Sep 04, 2019  1427 CT scan negative for dissection, stone, or acute abnormality. UA negative for infection. Troponin x 2 negative. Given this, suspect likely MSK etiology. Ordered Coreg for her BP based on Care Everywhere review of her recent [facility] admission, BP improved with this. Updated patient on results. She feels comfortable with d/c at this time. Will proceed with discharge, provide Rx for Coreg, and advised PCP follow-up especially in setting of medication adjustment. Advised OTC supportive care with NSAID. Patient voices understanding and is comfortable with plan and discharge.    [SM]    Clinical Course User Index [SM] Lilia Pro., MD     _______________________________   As part of my medical decision making I have reviewed available labs, radiology tests, reviewed old records, obtained additional history from family.  Final Clinical Impression(s) / ED Diagnosis  Final diagnoses:  Left flank pain       Note:  This document was prepared using Dragon voice recognition software and may include unintentional dictation errors.   Lilia Pro., MD 09/04/19 [phone]

## 2019-09-04 NOTE — ED Triage Notes (Signed)
Pt c/o right flank pain X 3 days. Denies hx of stones. No fever, NVD.  Elevated BP in triage. Pt reports sometimes it does run this high. Has taken bp meds.

## 2019-10-15 ENCOUNTER — Ambulatory Visit (LOCAL_COMMUNITY_HEALTH_CENTER): Payer: Self-pay

## 2019-10-15 ENCOUNTER — Other Ambulatory Visit: Payer: Self-pay

## 2019-10-15 DIAGNOSIS — Z111 Encounter for screening for respiratory tuberculosis: Secondary | ICD-10-CM

## 2019-10-18 ENCOUNTER — Ambulatory Visit (LOCAL_COMMUNITY_HEALTH_CENTER): Payer: Medicare HMO

## 2019-10-18 ENCOUNTER — Other Ambulatory Visit: Payer: Self-pay

## 2019-10-18 DIAGNOSIS — Z111 Encounter for screening for respiratory tuberculosis: Secondary | ICD-10-CM

## 2019-10-18 LAB — TB SKIN TEST
Induration: 17 mm
TB Skin Test: POSITIVE

## 2019-10-18 NOTE — Progress Notes (Signed)
Due to previously scheduled appt for 11 am, client could not stay today for positive PPD evaluation (17 mm) today. Appt scheduled for 10/29/2019. Rich Number, RN

## 2019-10-29 ENCOUNTER — Ambulatory Visit (LOCAL_COMMUNITY_HEALTH_CENTER): Payer: Self-pay

## 2019-10-29 ENCOUNTER — Other Ambulatory Visit: Payer: Self-pay

## 2019-10-29 DIAGNOSIS — Z111 Encounter for screening for respiratory tuberculosis: Secondary | ICD-10-CM

## 2019-10-31 ENCOUNTER — Ambulatory Visit (LOCAL_COMMUNITY_HEALTH_CENTER): Payer: Self-pay

## 2019-10-31 ENCOUNTER — Other Ambulatory Visit: Payer: Self-pay | Admitting: Family Medicine

## 2019-10-31 VITALS — Ht 60.0 in | Wt 199.0 lb

## 2019-10-31 DIAGNOSIS — R7611 Nonspecific reaction to tuberculin skin test without active tuberculosis: Secondary | ICD-10-CM

## 2019-10-31 LAB — TB SKIN TEST
Induration: 17 mm
TB Skin Test: POSITIVE

## 2019-10-31 NOTE — Progress Notes (Signed)
Patient with 45mm PPD on 10/18/19 but couldn't stay for EPI and CXR.  Discussed with patient LTBI vs. Active TB, tx options and need for CXR. Patient is interested in LTBI tx. EPI completed via phone; when patient returns to clinic for employer letter and Rifampin start will draw HIV/RPR and other baseline labs.   Patient reports had positive PPD around 1980 while incarcerated but never received tx. No records re: hx of +PPD.   Patient recently hospitalized for cerebral aneurysm repair. Aileen Fass, RN

## 2019-11-01 ENCOUNTER — Other Ambulatory Visit: Payer: Self-pay

## 2019-11-12 ENCOUNTER — Telehealth: Payer: Self-pay

## 2019-11-12 NOTE — Telephone Encounter (Signed)
TC with patient. Co needs to go for CXR d/t +PPD in May 2021. Patient reports will try and go for CXR next week. TB RN will f/u with patient Aileen Fass, RN

## 2019-11-22 ENCOUNTER — Encounter: Payer: Self-pay | Admitting: Surgery

## 2020-01-07 ENCOUNTER — Ambulatory Visit: Payer: Medicare HMO | Admitting: Surgery

## 2020-01-07 ENCOUNTER — Inpatient Hospital Stay (HOSPITAL_COMMUNITY): Admission: RE | Admit: 2020-01-07 | Payer: Medicare HMO | Source: Ambulatory Visit

## 2020-01-28 ENCOUNTER — Encounter (HOSPITAL_COMMUNITY): Payer: Self-pay

## 2020-01-28 ENCOUNTER — Ambulatory Visit: Payer: Medicare HMO | Admitting: Surgery

## 2020-01-28 ENCOUNTER — Inpatient Hospital Stay (HOSPITAL_COMMUNITY): Admission: RE | Admit: 2020-01-28 | Payer: Medicare HMO | Source: Ambulatory Visit

## 2020-06-24 ENCOUNTER — Ambulatory Visit: Payer: Medicare HMO | Admitting: Neurology

## 2020-07-22 ENCOUNTER — Other Ambulatory Visit: Payer: Self-pay | Admitting: *Deleted

## 2020-07-22 ENCOUNTER — Encounter: Payer: Self-pay | Admitting: *Deleted

## 2020-07-23 ENCOUNTER — Encounter: Payer: Self-pay | Admitting: Neurology

## 2020-07-23 ENCOUNTER — Ambulatory Visit: Payer: Medicare HMO | Admitting: Neurology

## 2020-12-07 ENCOUNTER — Encounter: Payer: Self-pay | Admitting: Emergency Medicine

## 2020-12-07 ENCOUNTER — Other Ambulatory Visit: Payer: Self-pay

## 2020-12-07 ENCOUNTER — Emergency Department: Payer: 59

## 2020-12-07 ENCOUNTER — Emergency Department
Admission: EM | Admit: 2020-12-07 | Discharge: 2020-12-07 | Disposition: A | Discharge status: Home / Self Care | Payer: 59 | Attending: Emergency Medicine | Admitting: Emergency Medicine

## 2020-12-07 DIAGNOSIS — I509 Heart failure, unspecified: Secondary | ICD-10-CM | POA: Diagnosis not present

## 2020-12-07 DIAGNOSIS — Z7982 Long term (current) use of aspirin: Secondary | ICD-10-CM | POA: Insufficient documentation

## 2020-12-07 DIAGNOSIS — I2510 Atherosclerotic heart disease of native coronary artery without angina pectoris: Secondary | ICD-10-CM | POA: Diagnosis not present

## 2020-12-07 DIAGNOSIS — M545 Low back pain, unspecified: Secondary | ICD-10-CM | POA: Insufficient documentation

## 2020-12-07 DIAGNOSIS — R202 Paresthesia of skin: Secondary | ICD-10-CM | POA: Diagnosis not present

## 2020-12-07 DIAGNOSIS — I130 Hypertensive heart and chronic kidney disease with heart failure and stage 1 through stage 4 chronic kidney disease, or unspecified chronic kidney disease: Secondary | ICD-10-CM | POA: Diagnosis not present

## 2020-12-07 DIAGNOSIS — F17210 Nicotine dependence, cigarettes, uncomplicated: Secondary | ICD-10-CM | POA: Diagnosis not present

## 2020-12-07 DIAGNOSIS — Z7902 Long term (current) use of antithrombotics/antiplatelets: Secondary | ICD-10-CM | POA: Insufficient documentation

## 2020-12-07 DIAGNOSIS — M25511 Pain in right shoulder: Secondary | ICD-10-CM | POA: Diagnosis present

## 2020-12-07 DIAGNOSIS — N183 Chronic kidney disease, stage 3 unspecified: Secondary | ICD-10-CM | POA: Diagnosis not present

## 2020-12-07 DIAGNOSIS — Z85828 Personal history of other malignant neoplasm of skin: Secondary | ICD-10-CM | POA: Diagnosis not present

## 2020-12-07 DIAGNOSIS — E1165 Type 2 diabetes mellitus with hyperglycemia: Secondary | ICD-10-CM | POA: Diagnosis not present

## 2020-12-07 DIAGNOSIS — Z85850 Personal history of malignant neoplasm of thyroid: Secondary | ICD-10-CM | POA: Diagnosis not present

## 2020-12-07 DIAGNOSIS — R252 Cramp and spasm: Secondary | ICD-10-CM | POA: Diagnosis not present

## 2020-12-07 DIAGNOSIS — G43001 Migraine without aura, not intractable, with status migrainosus: Secondary | ICD-10-CM

## 2020-12-07 DIAGNOSIS — Z79899 Other long term (current) drug therapy: Secondary | ICD-10-CM | POA: Insufficient documentation

## 2020-12-07 DIAGNOSIS — E039 Hypothyroidism, unspecified: Secondary | ICD-10-CM | POA: Diagnosis not present

## 2020-12-07 DIAGNOSIS — G43901 Migraine, unspecified, not intractable, with status migrainosus: Secondary | ICD-10-CM | POA: Diagnosis not present

## 2020-12-07 DIAGNOSIS — E1122 Type 2 diabetes mellitus with diabetic chronic kidney disease: Secondary | ICD-10-CM | POA: Diagnosis not present

## 2020-12-07 LAB — CBC WITH DIFFERENTIAL/PLATELET
Abs Immature Granulocytes: 0.01 10*3/uL (ref 0.00–0.07)
Basophils Absolute: 0.1 10*3/uL (ref 0.0–0.1)
Basophils Relative: 1 %
Eosinophils Absolute: 0.2 10*3/uL (ref 0.0–0.5)
Eosinophils Relative: 2 %
HCT: 37.7 % (ref 36.0–46.0)
Hemoglobin: 12.4 g/dL (ref 12.0–15.0)
Immature Granulocytes: 0 %
Lymphocytes Relative: 30 %
Lymphs Abs: 2.3 10*3/uL (ref 0.7–4.0)
MCH: 29.4 pg (ref 26.0–34.0)
MCHC: 32.9 g/dL (ref 30.0–36.0)
MCV: 89.3 fL (ref 80.0–100.0)
Monocytes Absolute: 0.5 10*3/uL (ref 0.1–1.0)
Monocytes Relative: 6 %
Neutro Abs: 4.6 10*3/uL (ref 1.7–7.7)
Neutrophils Relative %: 61 %
Platelets: 249 10*3/uL (ref 150–400)
RBC: 4.22 MIL/uL (ref 3.87–5.11)
RDW: 14.4 % (ref 11.5–15.5)
WBC: 7.5 10*3/uL (ref 4.0–10.5)
nRBC: 0 % (ref 0.0–0.2)

## 2020-12-07 LAB — COMPREHENSIVE METABOLIC PANEL
ALT: 19 U/L (ref 0–44)
AST: 24 U/L (ref 15–41)
Albumin: 4 g/dL (ref 3.5–5.0)
Alkaline Phosphatase: 97 U/L (ref 38–126)
Anion gap: 9 (ref 5–15)
BUN: 16 mg/dL (ref 6–20)
CO2: 22 mmol/L (ref 22–32)
Calcium: 9.1 mg/dL (ref 8.9–10.3)
Chloride: 108 mmol/L (ref 98–111)
Creatinine, Ser: 0.88 mg/dL (ref 0.44–1.00)
GFR, Estimated: >60 mL/min (ref >60)
Glucose, Bld: 105 mg/dL — ABNORMAL HIGH (ref 70–99)
Potassium: 3.4 mmol/L — ABNORMAL LOW (ref 3.5–5.1)
Sodium: 139 mmol/L (ref 135–145)
Total Bilirubin: 0.6 mg/dL (ref 0.3–1.2)
Total Protein: 7.5 g/dL (ref 6.5–8.1)

## 2020-12-07 MED ORDER — SODIUM CHLORIDE 0.9 % IV BOLUS
500.0000 mL | Freq: Once | INTRAVENOUS | Status: AC
  Administered 2020-12-07: 500 mL via INTRAVENOUS

## 2020-12-07 MED ORDER — NAPROXEN 375 MG PO TBEC: 1.0000 | DELAYED_RELEASE_TABLET | Freq: Two times a day (BID) | ORAL | 0 refills | Status: DC

## 2020-12-07 MED ORDER — ONDANSETRON HCL 4 MG/2ML IJ SOLN
4.0000 mg | Freq: Once | INTRAMUSCULAR | Status: AC
  Administered 2020-12-07: 4 mg via INTRAVENOUS
  Filled 2020-12-07: qty 2

## 2020-12-07 MED ORDER — KETOROLAC TROMETHAMINE 30 MG/ML IJ SOLN
30.0000 mg | Freq: Once | INTRAMUSCULAR | Status: AC
  Administered 2020-12-07: 30 mg via INTRAVENOUS
  Filled 2020-12-07: qty 1

## 2020-12-07 NOTE — Discharge Instructions (Addendum)
You were seen today for a migraine.  Your CT head showed some calcifications around your shunt which is something you should follow-up with your neurologist about.  You received fluids, Toradol and Zofran through your IV.  The x-ray of your right shoulder did show some bone spurs.  Your x-ray of your back does show some arthritic changes. I have given you a prescription for naproxen to take twice daily for the next 5 to 7 days.  Please take this with food and do not take this with meloxicam.  The numbness and tingling in your legs and muscle cramps could be related to your diabetes.  We drew an A1c but these results are pending.  Please follow-up with your primary care physician for further evaluation.

## 2020-12-07 NOTE — ED Notes (Signed)
Patient transported to CT 

## 2020-12-07 NOTE — ED Provider Notes (Signed)
[facility] Emergency Department Provider Note ____________________________________________  Time seen: 1630  I have reviewed the triage vital signs and the nursing notes.  HISTORY  Chief Complaint  Shoulder Pain   HPI Jordan Harvey is a 55 y.o. female presents to the ER today with multiple complaints.  She reports a headache that started 2 weeks ago.  The headache is located in the crown of her head.  She describes the pain as sharp and stabbing.  The pain radiates down the right side of her face into her right ear.  She reports associated sensitivity to light, lightheadedness and nausea.  She denies sensitivity to sound or vomiting.  She reports history of migraines but reports this is not her typical migraine.  She has taken Tylenol OTC with minimal relief of symptoms.  She also complains of right shoulder pain.  She reports this started 2 weeks ago.  She describes the shoulder pain as sharp and stabbing.  The pain radiates down into her bicep.  She denies neck pain.  She denies numbness or tingling in her right arm but has noticed some weakness and decreased range of motion.  She denies any current injury to the right shoulder but reports remote history of shoulder injury 20 years ago.  She is right-handed and reports she works in [employer].  She has taken Tylenol OTC with minimal relief of symptoms.  She also reports low back pain.  She reports this started a few months ago.  She describes the back pain as burning.  The pain does not radiate.  The pain is worse with movement.  She has not noticed any rash in the area.  She reports she does bend over quite frequently for work and thinks this could be related.  She has taken Tylenol OTC with minimal relief of symptoms.  She also reports cramping, numbness, tingling and burning in her lower extremities.  She reports this has been an ongoing issue for months.  She denies low back pain.  She reports the  symptoms are intermittent.  She has not taken anything OTC for this.   Past Medical History:  Diagnosis Date   Anxiety    CAD (coronary artery disease)    Cancer of thyroid ([place])    CKD (chronic kidney disease), stage III (HCC)    COPD (chronic obstructive pulmonary disease) (HCC)    Diabetes mellitus without complication (HCC)    DVT (deep venous thrombosis) (HCC)    Emphysema lung (HCC)    GERD (gastroesophageal reflux disease)    Heart failure (HCC)    CHF   Hyperlipidemia    Hypertension    Insomnia    MDD (major depressive disorder)    Osteoarthritis    PAD (peripheral artery disease) (HCC)    Skin cancer    cheek, squamous cell   Subarachnoid hemorrhage (HCC)    Thyroid cancer (HCC)    Thyroid disease    hypo   TIA (transient ischemic attack)    Vitreous floaters of left eye     Patient Active Problem List   Diagnosis Date Noted   Iliac artery occlusion ([place]) 01/08/2016   Diabetes ([place]) 01/08/2016   HTN (hypertension) 01/08/2016   Hypothyroidism 01/08/2016   PVD (peripheral vascular disease) ([place]) 01/08/2016   Intermittent chest pain 01/08/2016   HLD (hyperlipidemia) 01/08/2016   Exertional angina (HCC)    Preoperative cardiovascular examination     Past Surgical History:  Procedure Laterality Date   CARDIAC CATHETERIZATION  N/A 01/09/2016   Procedure: Left Heart Cath and Coronary Angiography;  Surgeon: Belva Crome, MD;  Location: [place] [facility];  Service: Cardiovascular;  Laterality: N/A;   CESAREAN SECTION     EYE SURGERY     INSERTION OF ILIAC STENT Left 01/09/2016   Procedure: Left Femoral Artery Exposure with INSERTION OF LEFT ILIAC STENT;  Surgeon: Serafina Mitchell, MD;  Location: [place];  Service: Vascular;  Laterality: Left;   THYROID SURGERY  2008   Not sure what they did   [place]    Prior to Admission medications   Medication Sig Start Date End Date Taking? Authorizing Provider  Naproxen 375 MG TBEC Take 1 tablet (375 mg total)  by mouth 2 (two) times daily. 12/07/20  Yes Jearld Fenton, NP  acetaminophen (TYLENOL) 325 MG tablet Take 2 tablets (650 mg total) by mouth every 6 (six) hours as needed. 01/02/17   Volanda Napoleon, PA-C  ALPRAZolam Duanne Moron) 0.5 MG tablet Take 0.5 mg by mouth at bedtime as needed for anxiety.    [provider]  amLODipine (NORVASC) 10 MG tablet Take 10 mg by mouth daily. 09/14/19   [provider]  aspirin EC 81 MG EC tablet Take 1 tablet (81 mg total) by mouth daily. 01/11/16   Dellia Nims, MD  atorvastatin (LIPITOR) 20 MG tablet Take 2 tablets (40 mg total) by mouth daily. Reported on 12/11/2015 04/27/16   Ward, Delice Bison, DO  Azilsartan-Chlorthalidone (EDARBYCLOR) 40-25 MG TABS Take 1 tablet by mouth daily.    [provider]  carvedilol (COREG) 6.25 MG tablet Take 1 tablet (6.25 mg total) by mouth 2 (two) times daily. 09/04/19 10/04/19  Lilia Pro., MD  chlorthalidone (HYGROTON) 25 MG tablet Take 1 tablet (25 mg total) by mouth daily. 04/27/16   Ward, Delice Bison, DO  clopidogrel (PLAVIX) 75 MG tablet Take 1 tablet (75 mg total) by mouth daily. 03/01/19   Nickel, Sharmon Leyden, NP  cyclobenzaprine (FLEXERIL) 5 MG tablet  05/22/19   [provider]  diclofenac Sodium (VOLTAREN) 1 % GEL  05/22/19   [provider]  dicyclomine (BENTYL) 20 MG tablet Take 1 tablet (20 mg total) by mouth 3 (three) times daily before meals. As needed for abdominal cramping 04/27/16   Ward, Delice Bison, DO  doxazosin (CARDURA) 1 MG tablet Take 1 mg by mouth 2 (two) times daily. 08/01/19 07/31/20  [provider]  furosemide (LASIX) 20 MG tablet Take 1 tablet (20 mg total) by mouth daily. 04/27/16   Ward, Delice Bison, DO  losartan (COZAAR) 100 MG tablet Take 1 tablet (100 mg total) by mouth daily. 04/27/16   Ward, Delice Bison, DO  meloxicam (MOBIC) 7.5 MG tablet Take 7.5 mg by mouth daily. 04/27/19   [provider]  metFORMIN (GLUCOPHAGE) 500 MG tablet Take 1 tablet (500  mg total) by mouth daily with breakfast. Reported on 12/11/2015 03/01/17   Nickel, Sharmon Leyden, NP  oxybutynin (DITROPAN-XL) 10 MG 24 hr tablet Take 10 mg by mouth daily.    [provider]  pantoprazole (PROTONIX) 40 MG tablet Take by mouth daily. 04/27/19   [provider]  PROAIR HFA 108 (90 Base) MCG/ACT inhaler Inhale 2 puffs into the lungs 4 (four) times daily as needed. 09/16/17   [provider]  QUEtiapine (SEROQUEL) 25 MG tablet Take 25 mg by mouth at bedtime.    [provider]    Allergies Bee venom  Family History  Problem Relation Age of Onset   Breast cancer Mother    CAD Sister 44       MI    Social History Social History   Tobacco Use   Smoking status: Light Smoker    Packs/day: 0.25    Pack years: 0.00    Types: Cigarettes   Smokeless tobacco: Never   Tobacco comments:    1/4 pack or less per day according to patient  Substance Use Topics   Alcohol use: No   Drug use: Yes    Frequency: 1.0 times per week    Types: Marijuana    Review of Systems  Constitutional: Negative for fever, chills or body aches. Eyes: Positive for sensitivity to light.  Negative for visual changes. ENT: Negative for runny nose, nasal congestion, ear pain or sore throat. Cardiovascular: Negative for chest pain or chest tightness. Respiratory: Negative for cough or shortness of breath. Gastrointestinal: Positive for intermittent nausea.  Negative for abdominal pain, vomiting, constipation and diarrhea. Genitourinary: Negative for urinary urgency, frequency dysuria or blood in her urine. Musculoskeletal: Positive for right shoulder pain, low back pain and muscle cramping of her lower extremities.  Negative for neck pain. Skin: Negative for rash. Neurological: Positive for headache, focal weakness of the right upper extremity, numbness/tingling/burning of her lower extremities.   ____________________________________________  PHYSICAL EXAM:  VITAL  SIGNS: ED Triage Vitals [12/07/20 1455]  Enc Vitals Group     BP (!) 131/95     Pulse Rate 93     Resp 16     Temp 98.8 F (37.1 C)     Temp Source Oral     SpO2 100 %     Weight 175 lb (79.4 kg)     Height 5\' 1"  (1.549 m)     Head Circumference      Peak Flow      Pain Score 7     Pain Loc      Pain Edu?      Excl. in [place]?     Constitutional: Alert and oriented.  Obese, in no distress. Head: Normocephalic. Eyes: PERRL.  Strabismus noted of left eye. Cardiovascular: Normal rate, regular rhythm.  Radial and pedal pulses 2+ bilaterally. Respiratory: Normal respiratory effort. No wheezes/rales/rhonchi. Gastrointestinal: Soft and nontender. Musculoskeletal: Decreased external rotation of the right shoulder.  Normal internal rotation of the right shoulder.  Negative drop can test on the right.  Pain with palpation of the right AC joint, subacromial bursa and anterior proximal biceps tendon.  Strength 5/5 BUE/BLE.  No bony tenderness noted over the lumbar spine. Pain with palpation of bilateral para lumbar muscles. Handgrips equal. Neurologic:  Normal gait without ataxia. Normal speech and language. No gross focal neurologic deficits are appreciated. Skin:  Skin is warm, dry and intact. No rash noted.  ____________________________________________   LABS  Labs Reviewed  COMPREHENSIVE METABOLIC PANEL - Abnormal; Notable for the following components:      Result Value   Potassium 3.4 (*)    Glucose, Bld 105 (*)    All other components within normal limits  CBC WITH DIFFERENTIAL/PLATELET  HEMOGLOBIN A1C    ____________________________________________   RADIOLOGY Imaging Orders  CT Head Wo Contrast  DG Shoulder Right  DG Lumbar Spine 2-3 Views   IMPRESSION: 1. No acute findings. 2. First degree anterolisthesis of L4 on L5. 3. Mild degenerative disc disease.   IMPRESSION: Trace acromioclavicular spurring.   IMPRESSION: 1. No acute intracranial findings are identified.  2.  Very unusual appearance of scattered coarse calcifications along the ependymal margin of the lateral ventricles, especially along the frontal horn of the right lateral ventricle, with none of these calcifications having been present on 07/18/2019. Reportedly the patient had a right frontal ventriculostomy shunt around the time her aneurysm was treated; presumably this highly unusual progression could reflect a case of post-ventriculostomy dystrophic calcification. Follow up of this unusual appearance with the patient's neurologist would be suggested although it is unlikely that this is contributing to the patient's symptoms. 3. Left-sided PCOM aneurysm coils noted. 4. Old right medial orbital wall fracture in stable appearance of right orbital prosthesis.  ____________________________________________   INITIAL IMPRESSION / ASSESSMENT AND PLAN / ED COURSE  Acute Headache, Sensitivity to Light, Nausea:  CT head shows some calcification around her VP shunt, not acute- advised  her to follow up with her neurologist regarding this CBC, CMET unremarkable 500 ml NS bolus IV x 1 Toradol 30 mg IV x 1 Zofran 4 mg IV x 1  Acute Right Shoulder Pain:  Xray of right shoulder shows bone spurs of the AC joint Toradol 30 mg PO x 1 RX for Naproxen 375 mg BID x 5 days Shoulder exercises given Work note provided   Acute Low Back Pain, Paresthesia and Muscle Cramping of BLE:  Xray lumbar spine shows some degenerative changes but no acute findings A1C pending RX for Naproxen 375 mg BID x 5 days Encouraged adequate water and electrolyte intake Encouraged daily stretching  She will follow up with PCP and neurology as an outpatient ____________________________________________  FINAL CLINICAL IMPRESSION(S) / ED DIAGNOSES  Final diagnoses:  Migraine without aura and with status migrainosus, not intractable  Acute pain of right shoulder  Acute midline low back pain without sciatica  Paresthesia of both  lower extremities  Muscle cramping  Type 2 diabetes mellitus with hyperglycemia, unspecified whether long term insulin use ([place])      Jearld Fenton, NP 12/07/20 1932    Duffy Bruce, MD 12/09/20 1206

## 2020-12-07 NOTE — ED Triage Notes (Signed)
Pt via POV from home. Pt c/o R shoulder pain last week, she said she had a R shoulder injury 20 years ago. Pt states she does do strenuous work. Pt also c/o headache, nausea, and light sensitivity. Pt states that she does have a hx of migraines but does take anything for it. Pt is A&Ox4 and NAD.

## 2020-12-09 LAB — HEMOGLOBIN A1C
Hgb A1c MFr Bld: 6.7 % — ABNORMAL HIGH (ref 4.8–5.6)
Mean Plasma Glucose: 146 mg/dL

## 2021-05-04 ENCOUNTER — Other Ambulatory Visit: Payer: Self-pay | Admitting: Family Medicine

## 2021-05-04 DIAGNOSIS — R109 Unspecified abdominal pain: Secondary | ICD-10-CM

## 2022-04-26 LAB — EXTERNAL GENERIC LAB PROCEDURE: COLOGUARD: NEGATIVE

## 2022-04-26 LAB — COLOGUARD: COLOGUARD: NEGATIVE

## 2022-05-07 ENCOUNTER — Emergency Department (HOSPITAL_COMMUNITY): Payer: Medicare Other

## 2022-05-07 ENCOUNTER — Other Ambulatory Visit: Payer: Self-pay

## 2022-05-07 ENCOUNTER — Emergency Department (HOSPITAL_COMMUNITY)
Admission: EM | Admit: 2022-05-07 | Discharge: 2022-05-08 | Disposition: A | Discharge status: Home / Self Care | Payer: Medicare Other | Attending: Emergency Medicine | Admitting: Emergency Medicine

## 2022-05-07 DIAGNOSIS — Z79899 Other long term (current) drug therapy: Secondary | ICD-10-CM | POA: Insufficient documentation

## 2022-05-07 DIAGNOSIS — Z7982 Long term (current) use of aspirin: Secondary | ICD-10-CM | POA: Insufficient documentation

## 2022-05-07 DIAGNOSIS — Z7984 Long term (current) use of oral hypoglycemic drugs: Secondary | ICD-10-CM | POA: Diagnosis not present

## 2022-05-07 DIAGNOSIS — Z7902 Long term (current) use of antithrombotics/antiplatelets: Secondary | ICD-10-CM | POA: Insufficient documentation

## 2022-05-07 DIAGNOSIS — J449 Chronic obstructive pulmonary disease, unspecified: Secondary | ICD-10-CM | POA: Insufficient documentation

## 2022-05-07 DIAGNOSIS — Z85828 Personal history of other malignant neoplasm of skin: Secondary | ICD-10-CM | POA: Diagnosis not present

## 2022-05-07 DIAGNOSIS — R519 Headache, unspecified: Secondary | ICD-10-CM | POA: Diagnosis present

## 2022-05-07 DIAGNOSIS — Z85850 Personal history of malignant neoplasm of thyroid: Secondary | ICD-10-CM | POA: Insufficient documentation

## 2022-05-07 DIAGNOSIS — I639 Cerebral infarction, unspecified: Secondary | ICD-10-CM | POA: Diagnosis not present

## 2022-05-07 DIAGNOSIS — I130 Hypertensive heart and chronic kidney disease with heart failure and stage 1 through stage 4 chronic kidney disease, or unspecified chronic kidney disease: Secondary | ICD-10-CM | POA: Diagnosis not present

## 2022-05-07 DIAGNOSIS — G4489 Other headache syndrome: Secondary | ICD-10-CM | POA: Diagnosis not present

## 2022-05-07 DIAGNOSIS — N183 Chronic kidney disease, stage 3 unspecified: Secondary | ICD-10-CM | POA: Diagnosis not present

## 2022-05-07 DIAGNOSIS — I509 Heart failure, unspecified: Secondary | ICD-10-CM | POA: Insufficient documentation

## 2022-05-07 DIAGNOSIS — I2510 Atherosclerotic heart disease of native coronary artery without angina pectoris: Secondary | ICD-10-CM | POA: Insufficient documentation

## 2022-05-07 DIAGNOSIS — E1122 Type 2 diabetes mellitus with diabetic chronic kidney disease: Secondary | ICD-10-CM | POA: Diagnosis not present

## 2022-05-07 LAB — CBC WITH DIFFERENTIAL/PLATELET
Abs Immature Granulocytes: 0.02 10*3/uL (ref 0.00–0.07)
Basophils Absolute: 0.1 10*3/uL (ref 0.0–0.1)
Basophils Relative: 1 %
Eosinophils Absolute: 0.2 10*3/uL (ref 0.0–0.5)
Eosinophils Relative: 3 %
HCT: 39.3 % (ref 36.0–46.0)
Hemoglobin: 13.2 g/dL (ref 12.0–15.0)
Immature Granulocytes: 0 %
Lymphocytes Relative: 27 %
Lymphs Abs: 2.5 10*3/uL (ref 0.7–4.0)
MCH: 30.4 pg (ref 26.0–34.0)
MCHC: 33.6 g/dL (ref 30.0–36.0)
MCV: 90.6 fL (ref 80.0–100.0)
Monocytes Absolute: 0.4 10*3/uL (ref 0.1–1.0)
Monocytes Relative: 4 %
Neutro Abs: 6 10*3/uL (ref 1.7–7.7)
Neutrophils Relative %: 65 %
Platelets: 342 10*3/uL (ref 150–400)
RBC: 4.34 MIL/uL (ref 3.87–5.11)
RDW: 14.7 % (ref 11.5–15.5)
WBC: 9.2 10*3/uL (ref 4.0–10.5)
nRBC: 0 % (ref 0.0–0.2)

## 2022-05-07 LAB — COMPREHENSIVE METABOLIC PANEL
ALT: 15 U/L (ref 0–44)
AST: 20 U/L (ref 15–41)
Albumin: 3.9 g/dL (ref 3.5–5.0)
Alkaline Phosphatase: 85 U/L (ref 38–126)
Anion gap: 10 (ref 5–15)
BUN: 13 mg/dL (ref 6–20)
CO2: 23 mmol/L (ref 22–32)
Calcium: 9.3 mg/dL (ref 8.9–10.3)
Chloride: 105 mmol/L (ref 98–111)
Creatinine, Ser: 1.28 mg/dL — ABNORMAL HIGH (ref 0.44–1.00)
GFR, Estimated: 49 mL/min — ABNORMAL LOW (ref >60)
Glucose, Bld: 166 mg/dL — ABNORMAL HIGH (ref 70–99)
Potassium: 3.6 mmol/L (ref 3.5–5.1)
Sodium: 138 mmol/L (ref 135–145)
Total Bilirubin: 0.6 mg/dL (ref 0.3–1.2)
Total Protein: 7.2 g/dL (ref 6.5–8.1)

## 2022-05-07 LAB — TROPONIN I (HIGH SENSITIVITY)
Troponin I (High Sensitivity): 6 ng/L (ref <18)
Troponin I (High Sensitivity): 7 ng/L (ref <18)

## 2022-05-07 NOTE — ED Triage Notes (Signed)
Pt in with sharp HA x 1 wk, also having blurred vision. States hx of brain aneurysm with surgical intervention back in February. Pt also reports dizziness and L leg pain and numbness x 3 days. Pt reports central stabbing cp began today. Reports near syncope today

## 2022-05-07 NOTE — ED Provider Triage Note (Signed)
Emergency Medicine Provider Triage Evaluation Note  Barbara Ahart , a 56 y.o. female  was evaluated in triage.  Pt complains of headache that is been ongoing for the past week.  Prior history of aneurysm repair.  Has been taking Excedrin for pain without any improvement in symptoms.  Symptoms are exacerbated with any type of movement and bending over.  Prior history of aneurysm repair back in February by [employer].  Also endorsing central chest pressure that began earlier today, accompanied by shortness of breath.  Review of Systems  Positive: Chest pain, headache, sob Negative: Vision changes, fever, neck pain  Physical Exam  BP (!) 144/95   Pulse 100   Temp 98.4 F (36.9 C) (Oral)   Resp 18   LMP 03/21/2014   SpO2 96%  Gen:   Awake, no distress   Resp:  Normal effort  MSK:   Moves extremities without difficulty  Other:  Bilateral upper and lower extremities with equal strength.  Neuroexam is benign, ambulatory with a steady gait.  Medical Decision Making  Medically screening exam initiated at 7:52 PM.  Appropriate orders placed.  Eva Griffo was informed that the remainder of the evaluation will be completed by another provider, this initial triage assessment does not replace that evaluation, and the importance of remaining in the ED until their evaluation is complete.     Janeece Fitting, PA-C 05/07/22 1956

## 2022-05-08 ENCOUNTER — Emergency Department (HOSPITAL_COMMUNITY): Payer: Medicare Other

## 2022-05-08 ENCOUNTER — Encounter (HOSPITAL_COMMUNITY): Payer: Self-pay | Admitting: Emergency Medicine

## 2022-05-08 DIAGNOSIS — I639 Cerebral infarction, unspecified: Secondary | ICD-10-CM

## 2022-05-08 DIAGNOSIS — G4489 Other headache syndrome: Secondary | ICD-10-CM | POA: Diagnosis not present

## 2022-05-08 MED ORDER — PROCHLORPERAZINE EDISYLATE 10 MG/2ML IJ SOLN
10.0000 mg | Freq: Once | INTRAMUSCULAR | Status: AC
  Administered 2022-05-08: 10 mg via INTRAVENOUS
  Filled 2022-05-08: qty 2

## 2022-05-08 NOTE — ED Provider Notes (Signed)
[facility] EMERGENCY DEPARTMENT Provider Note   CSN: [id number] Arrival date & time: 05/07/22  1933     History  Chief Complaint  Patient presents with   Headache   Dizziness    Jordan Harvey is a 56 y.o. female.  The history is provided by the patient.  Patient with extensive history including CAD, COPD, diabetes, previous DVT on anticoagulation, previous history of subarachnoid hemorrhage with aneurysm coiled at [facility] presents with multiple complaints.  She reports for the past 2-3 days she has been having headache whenever she bends down.  It is been gradual in onset.  It is not sudden onset.  She reports intermittent dizziness but no falls or syncope.  No fevers or vomiting.  She also reports pain in her left neck, and is felt weak in her left arm and left leg for several days. She also had some chest pressure prior to arrival    Past Medical History:  Diagnosis Date   Anxiety    CAD (coronary artery disease)    Cancer of thyroid (HCC)    CKD (chronic kidney disease), stage III (HCC)    COPD (chronic obstructive pulmonary disease) (HCC)    Diabetes mellitus without complication (HCC)    DVT (deep venous thrombosis) (HCC)    Emphysema lung (HCC)    GERD (gastroesophageal reflux disease)    Heart failure (HCC)    CHF   Hyperlipidemia    Hypertension    Insomnia    MDD (major depressive disorder)    Osteoarthritis    PAD (peripheral artery disease) (HCC)    Skin cancer    cheek, squamous cell   Subarachnoid hemorrhage (HCC)    Thyroid cancer (HCC)    Thyroid disease    hypo   TIA (transient ischemic attack)    Vitreous floaters of left eye     Home Medications Prior to Admission medications   Medication Sig Start Date End Date Taking? Authorizing Provider  acetaminophen (TYLENOL) 325 MG tablet Take 2 tablets (650 mg total) by mouth every 6 (six) hours as needed. 01/02/17   Volanda Napoleon, PA-C  ALPRAZolam Duanne Moron) 0.5 MG  tablet Take 0.5 mg by mouth at bedtime as needed for anxiety.    [provider]  amLODipine (NORVASC) 10 MG tablet Take 10 mg by mouth daily. 09/14/19   [provider]  aspirin EC 81 MG EC tablet Take 1 tablet (81 mg total) by mouth daily. 01/11/16   Dellia Nims, MD  atorvastatin (LIPITOR) 20 MG tablet Take 2 tablets (40 mg total) by mouth daily. Reported on 12/11/2015 04/27/16   Ward, Delice Bison, DO  Azilsartan-Chlorthalidone (EDARBYCLOR) 40-25 MG TABS Take 1 tablet by mouth daily.    [provider]  carvedilol (COREG) 6.25 MG tablet Take 1 tablet (6.25 mg total) by mouth 2 (two) times daily. 09/04/19 10/04/19  Lilia Pro., MD  chlorthalidone (HYGROTON) 25 MG tablet Take 1 tablet (25 mg total) by mouth daily. 04/27/16   Ward, Delice Bison, DO  clopidogrel (PLAVIX) 75 MG tablet Take 1 tablet (75 mg total) by mouth daily. 03/01/19   Nickel, Sharmon Leyden, NP  cyclobenzaprine (FLEXERIL) 5 MG tablet  05/22/19   [provider]  diclofenac Sodium (VOLTAREN) 1 % GEL  05/22/19   [provider]  dicyclomine (BENTYL) 20 MG tablet Take 1 tablet (20 mg total) by mouth 3 (three) times daily before meals. As needed for abdominal cramping 04/27/16   Ward,  Kristen N, DO  doxazosin (CARDURA) 1 MG tablet Take 1 mg by mouth 2 (two) times daily. 08/01/19 07/31/20  [provider]  furosemide (LASIX) 20 MG tablet Take 1 tablet (20 mg total) by mouth daily. 04/27/16   Ward, Delice Bison, DO  losartan (COZAAR) 100 MG tablet Take 1 tablet (100 mg total) by mouth daily. 04/27/16   Ward, Delice Bison, DO  meloxicam (MOBIC) 7.5 MG tablet Take 7.5 mg by mouth daily. 04/27/19   [provider]  metFORMIN (GLUCOPHAGE) 500 MG tablet Take 1 tablet (500 mg total) by mouth daily with breakfast. Reported on 12/11/2015 03/01/17   Nickel, Sharmon Leyden, NP  Naproxen 375 MG TBEC Take 1 tablet (375 mg total) by mouth 2 (two) times daily. 12/07/20   Jearld Fenton, NP  oxybutynin (DITROPAN-XL) 10  MG 24 hr tablet Take 10 mg by mouth daily.    [provider]  pantoprazole (PROTONIX) 40 MG tablet Take by mouth daily. 04/27/19   [provider]  PROAIR HFA 108 (90 Base) MCG/ACT inhaler Inhale 2 puffs into the lungs 4 (four) times daily as needed. 09/16/17   [provider]  QUEtiapine (SEROQUEL) 25 MG tablet Take 25 mg by mouth at bedtime.    [provider]      Allergies    Bee venom    Review of Systems   Review of Systems  Constitutional:  Negative for fever.  Neurological:  Positive for dizziness and headaches. Negative for seizures.    Physical Exam Updated Vital Signs BP 101/60   Pulse 69   Temp 98 F (36.7 C)   Resp 17   Wt 79.4 kg   LMP 03/21/2014   SpO2 91%   BMI 33.07 kg/m  Physical Exam CONSTITUTIONAL: Well developed/well nourished HEAD: Normocephalic/atraumatic EYES: Right eye prosthetic.  Left eye pupil constricted ENMT: Mucous membranes moist NECK: supple no meningeal signs Mild left cervical paraspinal tenderness.  No erythema or warmth noted CV: S1/S2 noted, no murmurs/rubs/gallops noted LUNGS: Lungs are clear to auscultation bilaterally, no apparent distress ABDOMEN: soft, nontender, no rebound or guarding GU:no cva tenderness NEURO:Awake/alert, face symmetric, no arm or leg drift is noted Equal 5/5 strength with shoulder abduction, elbow flex/extension, wrist flex/extension in upper extremities and equal hand grips bilaterally Equal 5/5 strength with hip flexion,knee flex/extension, foot dorsi/plantar flexion Cranial nerves are grossly intact Sensation to light touch intact in all extremities EXTREMITIES: pulses normal, full ROM Distal pulses are intact.  Pulses in left arm and left leg found by Doppler.  She reports previous stenting in left leg No deformities to her extremities.  No erythema.  No discoloration. SKIN: warm, color normal PSYCH: no abnormalities of mood noted  ED Results / Procedures /  Treatments   Labs (all labs ordered are listed, but only abnormal results are displayed) Labs Reviewed  COMPREHENSIVE METABOLIC PANEL - Abnormal; Notable for the following components:      Result Value   Glucose, Bld 166 (*)    Creatinine, Ser 1.28 (*)    GFR, Estimated 49 (*)    All other components within normal limits  CBC WITH DIFFERENTIAL/PLATELET  TROPONIN I (HIGH SENSITIVITY)  TROPONIN I (HIGH SENSITIVITY)    EKG EKG Interpretation  Date/Time:  Saturday May 08 2022 00:33:36 EST Ventricular Rate:  74 PR Interval:  210 QRS Duration: 112 QT Interval:  444 QTC Calculation: 493 R Axis:   -39 Text Interpretation: Sinus rhythm Prolonged PR interval Incomplete left bundle branch block  Borderline prolonged QT interval Interpretation limited secondary to artifact Confirmed by Ripley Fraise [id number]) on 05/08/2022 1:10:44 AM  Radiology CT HEAD WO CONTRAST (5MM)  Result Date: 05/07/2022 CLINICAL DATA:  Headache and blurry vision EXAM: CT HEAD WITHOUT CONTRAST TECHNIQUE: Contiguous axial images were obtained from the base of the skull through the vertex without intravenous contrast. RADIATION DOSE REDUCTION: This exam was performed according to the departmental dose-optimization program which includes automated exposure control, adjustment of the mA and/or kV according to patient size and/or use of iterative reconstruction technique. COMPARISON:  12/07/2020 FINDINGS: Brain: No acute abnormality. Subacute minimal calcifications in the frontal horn of the right lateral ventricle. Vascular: Left ICA terminus/P-comm origin area aneurysm clip. Skull: The visualized skull base, calvarium and extracranial soft tissues are normal. Sinuses/Orbits: No fluid levels or advanced mucosal thickening of the visualized paranasal sinuses. No mastoid or middle ear effusion. Right ocular prosthesis. IMPRESSION: 1. No acute intracranial abnormality. 2. Left ICA terminus/P-comm origin area aneurysm clip.  Electronically Signed   By: Ulyses Jarred M.D.   On: 05/07/2022 20:28    Procedures Procedures    Medications Ordered in ED Medications  prochlorperazine (COMPAZINE) injection 10 mg (10 mg Intravenous Given 05/08/22 0320)    ED Course/ Medical Decision Making/ A&P Clinical Course as of 05/08/22 0748  Sat May 08, 2022  0309 Patient is a poor historian with multiple complaints.  It appears her main concern is headache with position and dizziness.  There is no focal weakness.  Initial CT head negative.  She does not describe symptoms of a subarachnoid hemorrhage.  Will consult neurology [DW]  0408 D/w dr Cheral Marker, he will see patient [DW]  0745 Patient seen by neurology.  Dr. Cheral Marker feels patient needs MRI.  However it is unclear if her previous coiling is MRI compatible.  He recommends neurosurgery evaluation [DW]  [phone] Discussed with neurosurgery.  They will attempt to find out if this is MRI safe. [DW]  [phone] Patient currently resting comfortably.  Signed out to Dr. Zenia Resides with nsgy recs pending.  Would recommend MRI brain/cspine if possible.  If negative she can be discharged  [DW]    Clinical Course User Index [DW] Ripley Fraise, MD                           Medical Decision Making Risk Prescription drug management.   This patient presents to the ED for concern of headache, this involves an extensive number of treatment options, and is a complaint that carries with it a high risk of complications and morbidity.  The differential diagnosis includes but is not limited to subarachnoid hemorrhage, intracranial hemorrhage, meningitis, encephalitis, CVST, temporal arteritis, idiopathic intracranial hypertension, migraine    Comorbidities that complicate the patient evaluation: Patient's presentation is complicated by their history of DVT, aneurysm  Additional history obtained: Records reviewed Care Everywhere/External Records  Lab Tests: I Ordered, and personally interpreted labs.   The pertinent results include: Mild hyperglycemia  Imaging Studies ordered: I ordered imaging studies including CT scan head   I independently visualized and interpreted imaging which showed no acute findings I agree with the radiologist interpretation  Cardiac Monitoring: The patient was maintained on a cardiac monitor.  I personally viewed and interpreted the cardiac monitor which showed an underlying rhythm of:  sinus rhythm  Medicines ordered and prescription drug management: I ordered medication including Compazine for headache Reevaluation of the patient after these medicines showed that the patient  improved   Consultations Obtained: I requested consultation with the consultant neurology , and discussed  findings as well as pertinent plan - they recommend: recommends MRI  Reevaluation: After the interventions noted above, I reevaluated the patient and found that they have :improved  Complexity of problems addressed: Patient's presentation is most consistent with  acute presentation with potential threat to life or bodily function            Final Clinical Impression(s) / ED Diagnoses Final diagnoses:  Other headache syndrome    Rx / DC Orders ED Discharge Orders     None         Ripley Fraise, MD 05/08/22 [phone]

## 2022-05-08 NOTE — Consult Note (Signed)
NEURO HOSPITALIST CONSULT NOTE   Requestig physician: Dr. Christy Gentles  Reason for Consult: One week history of sharp headache that changes with position, in conjunction with blurred vision, dizziness, left leg pain and numbness  History obtained from:  Patient and Chart     HPI:                                                                                                                                          Jordan Harvey is an 56 y.o. female with a PMHx of CAD, thyroid cancer, anxiety, CKD, COPD, DM, DVT, GERD, CHF, HLD, HTN, MDD, insomnia, osteoarthritis, PAD s/p left iliac artery stenting, subarachnoid hemorrhage, aneurysm s/p clipping and TIA who presents to th ED with a one week history of sharp headache that changes with position, in conjunction with blurred vision and dizziness. She also has had left leg pain and numbness x 3 days. She had a near-syncopal episode prior to presentation and also complains of central stabbing pain that began on Friday.   Home medications include ASA, Plavix and atorvastatin.   Past Medical History:  Diagnosis Date   Anxiety    CAD (coronary artery disease)    Cancer of thyroid ([place])    CKD (chronic kidney disease), stage III (HCC)    COPD (chronic obstructive pulmonary disease) (HCC)    Diabetes mellitus without complication (HCC)    DVT (deep venous thrombosis) (HCC)    Emphysema lung (HCC)    GERD (gastroesophageal reflux disease)    Heart failure (HCC)    CHF   Hyperlipidemia    Hypertension    Insomnia    MDD (major depressive disorder)    Osteoarthritis    PAD (peripheral artery disease) (HCC)    Skin cancer    cheek, squamous cell   Subarachnoid hemorrhage (HCC)    Thyroid cancer (HCC)    Thyroid disease    hypo   TIA (transient ischemic attack)    Vitreous floaters of left eye     Past Surgical History:  Procedure Laterality Date   CARDIAC CATHETERIZATION N/A 01/09/2016   Procedure: Left Heart Cath  and Coronary Angiography;  Surgeon: Belva Crome, MD;  Location: [facility];  Service: Cardiovascular;  Laterality: N/A;   CESAREAN SECTION     EYE SURGERY     INSERTION OF ILIAC STENT Left 01/09/2016   Procedure: Left Femoral Artery Exposure with INSERTION OF LEFT ILIAC STENT;  Surgeon: Serafina Mitchell, MD;  Location: [facility] OR;  Service: Vascular;  Laterality: Left;   THYROID SURGERY  2008   Not sure what they did   TUBAL LIGATION  1991    Family History  Problem Relation Age of Onset   Breast cancer Mother    CAD  Sister 58       MI              Social History:  reports that she has been smoking cigarettes. She has been smoking an average of .25 packs per day. She has never used smokeless tobacco. She reports current drug use. Frequency: 1.00 time per week. Drug: Marijuana. She reports that she does not drink alcohol.  Allergies  Allergen Reactions   Bee Venom Shortness Of Breath    MEDICATIONS:                                                                                                                     No current facility-administered medications on file prior to encounter.   Current Outpatient Medications on File Prior to Encounter  Medication Sig Dispense Refill   acetaminophen (TYLENOL) 325 MG tablet Take 2 tablets (650 mg total) by mouth every 6 (six) hours as needed. 30 tablet 0   ALPRAZolam (XANAX) 0.5 MG tablet Take 0.5 mg by mouth at bedtime as needed for anxiety.     amLODipine (NORVASC) 10 MG tablet Take 10 mg by mouth daily.     aspirin EC 81 MG EC tablet Take 1 tablet (81 mg total) by mouth daily. 30 tablet 0   atorvastatin (LIPITOR) 20 MG tablet Take 2 tablets (40 mg total) by mouth daily. Reported on 12/11/2015 30 tablet 1   Azilsartan-Chlorthalidone (EDARBYCLOR) 40-25 MG TABS Take 1 tablet by mouth daily.     carvedilol (COREG) 6.25 MG tablet Take 1 tablet (6.25 mg total) by mouth 2 (two) times daily. 60 tablet 0   chlorthalidone (HYGROTON) 25 MG tablet  Take 1 tablet (25 mg total) by mouth daily. 30 tablet 1   clopidogrel (PLAVIX) 75 MG tablet Take 1 tablet (75 mg total) by mouth daily. 30 tablet 1   cyclobenzaprine (FLEXERIL) 5 MG tablet      diclofenac Sodium (VOLTAREN) 1 % GEL      dicyclomine (BENTYL) 20 MG tablet Take 1 tablet (20 mg total) by mouth 3 (three) times daily before meals. As needed for abdominal cramping 20 tablet 0   doxazosin (CARDURA) 1 MG tablet Take 1 mg by mouth 2 (two) times daily.     furosemide (LASIX) 20 MG tablet Take 1 tablet (20 mg total) by mouth daily. 30 tablet 1   losartan (COZAAR) 100 MG tablet Take 1 tablet (100 mg total) by mouth daily. 30 tablet 1   meloxicam (MOBIC) 7.5 MG tablet Take 7.5 mg by mouth daily.     metFORMIN (GLUCOPHAGE) 500 MG tablet Take 1 tablet (500 mg total) by mouth daily with breakfast. Reported on 12/11/2015 30 tablet 1   Naproxen 375 MG TBEC Take 1 tablet (375 mg total) by mouth 2 (two) times daily. 14 tablet 0   oxybutynin (DITROPAN-XL) 10 MG 24 hr tablet Take 10 mg by mouth daily.     pantoprazole (PROTONIX) 40 MG tablet Take by mouth daily.  PROAIR HFA 108 (90 Base) MCG/ACT inhaler Inhale 2 puffs into the lungs 4 (four) times daily as needed.  5   QUEtiapine (SEROQUEL) 25 MG tablet Take 25 mg by mouth at bedtime.      ROS:                                                                                                                                       As per HPI.    Blood pressure (!) 118/58, pulse 76, temperature 98.3 F (36.8 C), resp. rate 20, weight 79.4 kg, last menstrual period 03/21/2014, SpO2 96 %.   General Examination:                                                                                                       Physical Exam  HEENT-  [place]/AT. No nuchal rigidity    Lungs-  Respirations unlabored Extremities- No edema  Neurological Examination Mental Status: Awake and alert. Oriented x 5. Speech fluent without evidence of aphasia.  Able to follow all  commands without difficulty. Cranial Nerves: II: Has a prosthetic right eye. Left pupil reactive. Visual fields intact OS without extinction to DSS.   III,IV, VI: EOMI left eye; no nystagmus. Has glass right eye.  V: Temp sensation equal bilaterally VII: Smile symmetric.  VIII: Hearing intact to voice IX,X: No hypophonia or hoarseness XI: Symmetric XII: Midline tongue extension Motor: Right : Upper extremity   5/5    Left:     Upper extremity   5/5  Lower extremity   5/5     Lower extremity   5/5 No pronator drift Sensory: Light touch intact throughout, bilaterally. Decreased temp sensation to LUE and LLE. There is extinction to LUE with DSS to BUE but no extinction when testing BLE.  Deep Tendon Reflexes: 2+ and symmetric throughout Plantars: Right: downgoing   Left: downgoing Cerebellar: No ataxia with FNF bilaterally Gait: Deferred   Lab Results: Basic Metabolic Panel: Recent Labs  Lab 05/07/22 2001  NA 138  K 3.6  CL 105  CO2 23  GLUCOSE 166*  BUN 13  CREATININE 1.28*  CALCIUM 9.3    CBC: Recent Labs  Lab 05/07/22 2001  WBC 9.2  NEUTROABS 6.0  HGB 13.2  HCT 39.3  MCV 90.6  PLT 342    Cardiac Enzymes: No results for input(s): "CKTOTAL", "CKMB", "CKMBINDEX", "TROPONINI" in the last 168 hours.  Lipid Panel: No results for input(s): "CHOL", "TRIG", "HDL", "CHOLHDL", "VLDL", "LDLCALC"  in the last 168 hours.  Imaging: CT HEAD WO CONTRAST (5MM)  Result Date: 05/07/2022 CLINICAL DATA:  Headache and blurry vision EXAM: CT HEAD WITHOUT CONTRAST TECHNIQUE: Contiguous axial images were obtained from the base of the skull through the vertex without intravenous contrast. RADIATION DOSE REDUCTION: This exam was performed according to the departmental dose-optimization program which includes automated exposure control, adjustment of the mA and/or kV according to patient size and/or use of iterative reconstruction technique. COMPARISON:  12/07/2020 FINDINGS: Brain: No acute  abnormality. Subacute minimal calcifications in the frontal horn of the right lateral ventricle. Vascular: Left ICA terminus/P-comm origin area aneurysm clip. Skull: The visualized skull base, calvarium and extracranial soft tissues are normal. Sinuses/Orbits: No fluid levels or advanced mucosal thickening of the visualized paranasal sinuses. No mastoid or middle ear effusion. Right ocular prosthesis. IMPRESSION: 1. No acute intracranial abnormality. 2. Left ICA terminus/P-comm origin area aneurysm clip. Electronically Signed   By: Ulyses Jarred M.D.   On: 05/07/2022 20:28     Assessment: 56 y.o. female with a PMHx of CAD, thyroid cancer, anxiety, CKD, COPD, DM, DVT, GERD, CHF, HLD, HTN, MDD, insomnia, osteoarthritis, PAD s/p left iliac artery stenting, subarachnoid hemorrhage, aneurysm s/p clipping and TIA who presents to th ED with a one week history of sharp headache that changes with position, in conjunction with blurred vision and dizziness. She also has had left leg pain and numbness x 3 days. She had a near-syncopal episode prior to presentation and also complains of central stabbing pain that began on Friday.  - Exam reveals decreased sensation to temperature LUE and LLE. There is extinction to LUE with DSS to BUE but no extinction when testing BLE.  - CT head: No acute intracranial abnormality. Left ICA terminus/P-comm origin area aneurysm clip. - She needs an MRI due to left sided numbness. DDx includes right thalamic lacune. Had clip [facility] at [facility] in February 2023 therefore most likely not ferromagnetic. May need to curbside Neurosurgery to get exact info on the composition of the aneurysm clip.   Recommendations: - Determination of the type of aneurysm clip she has in order to obtain MRI, is in progress. May need Neurosurgery input. Most modern neurosurgical instrumentation is non-ferromagnetic and safe for MRI, but will need to verify regarding the specific clip that she has.  - Management of  headache pain per ED. For tension type headache benzodiazepine or NSAID may be effective. For suspected migraine, Compazine or Phenergan IV are often effective.    Electronically signed: Dr. Kerney Elbe 05/08/2022, 3:29 AM

## 2022-05-08 NOTE — ED Provider Notes (Signed)
Patient signed to me by Dr. Christy Gentles pending results of brain MRI which came back negative.  Patient has no neurological exam at this time.  Will discharge   Jordan Leigh, MD 05/08/22 1054

## 2022-05-08 NOTE — Discharge Instructions (Signed)
Followup with your doctor as needed

## 2022-10-26 IMAGING — CR DG LUMBAR SPINE 2-3V
1 series · 2 of 2 positions shown · non-contrast
Comparison: CT AP 04/27/2016

CLINICAL DATA: Low back pain

EXAM:
LUMBAR SPINE - 2-3 VIEW

[Series 1: dg lumbar spine 2-3 views · 0.14mm/px · 2 of 2 slices shown]
[im 1/2]
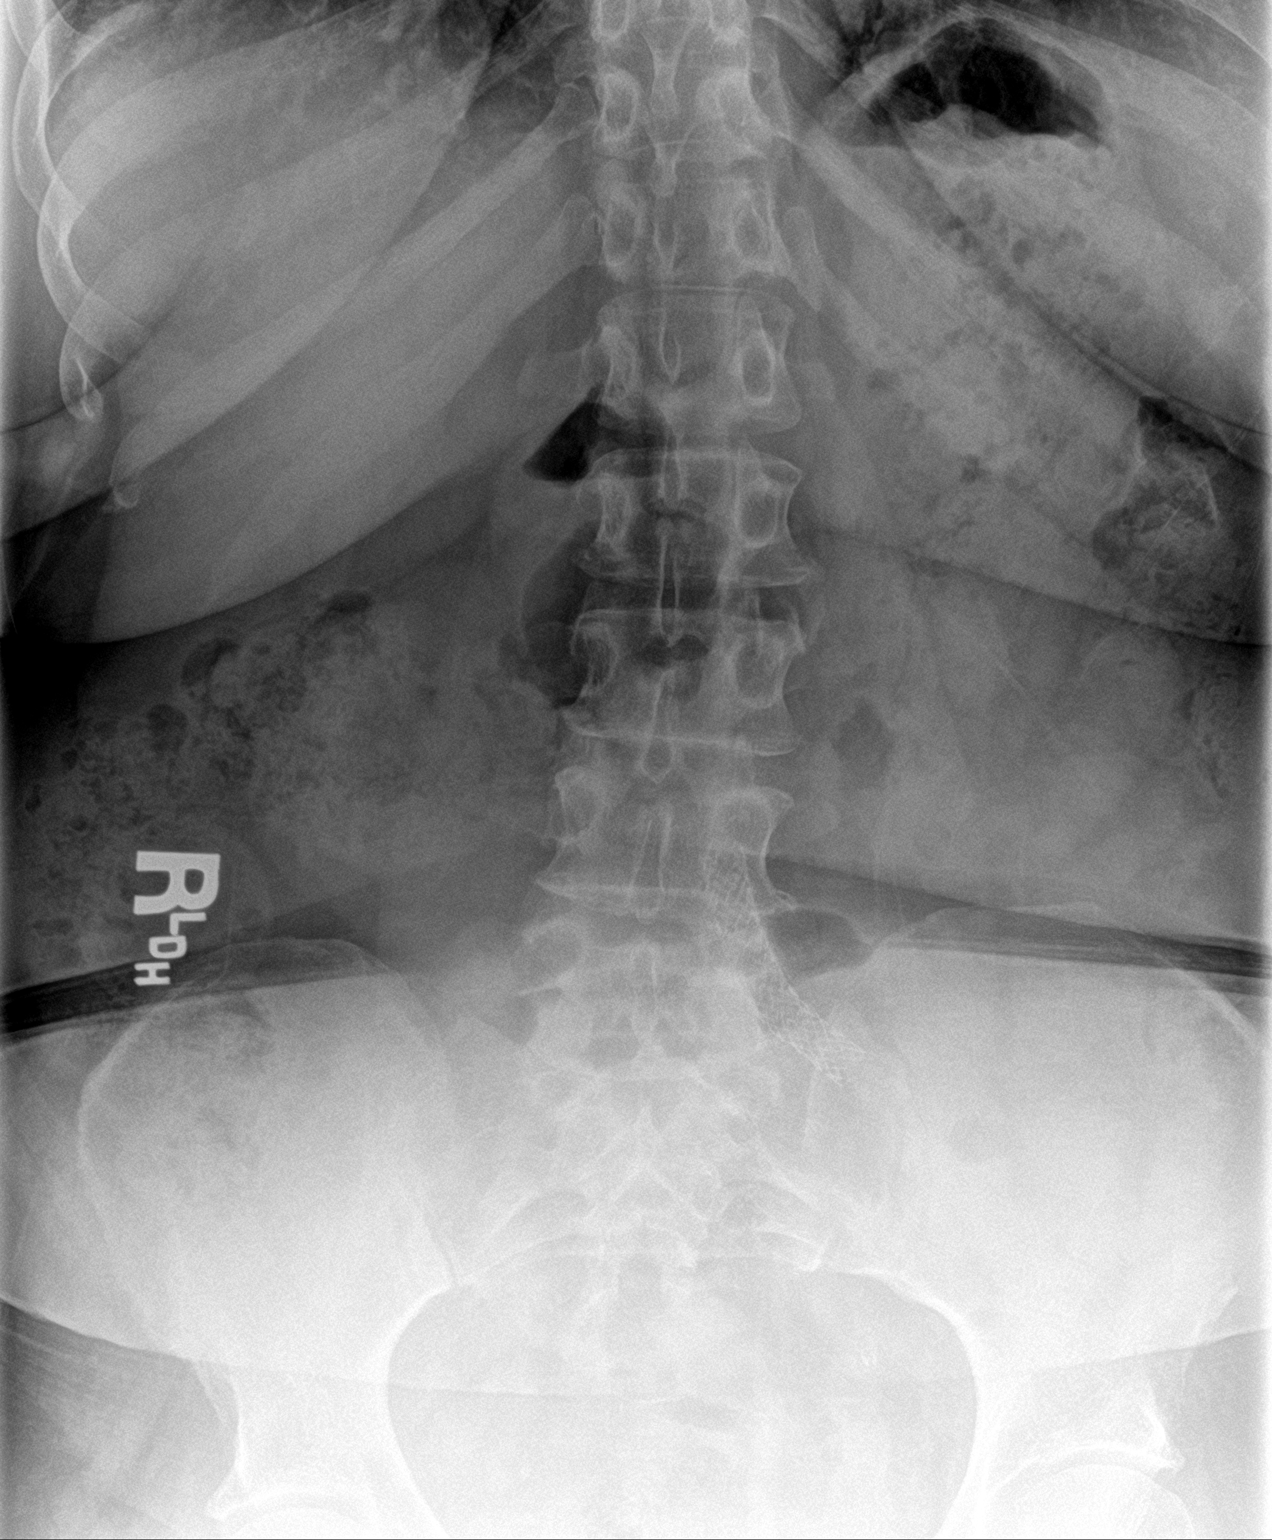
[im 2/2]
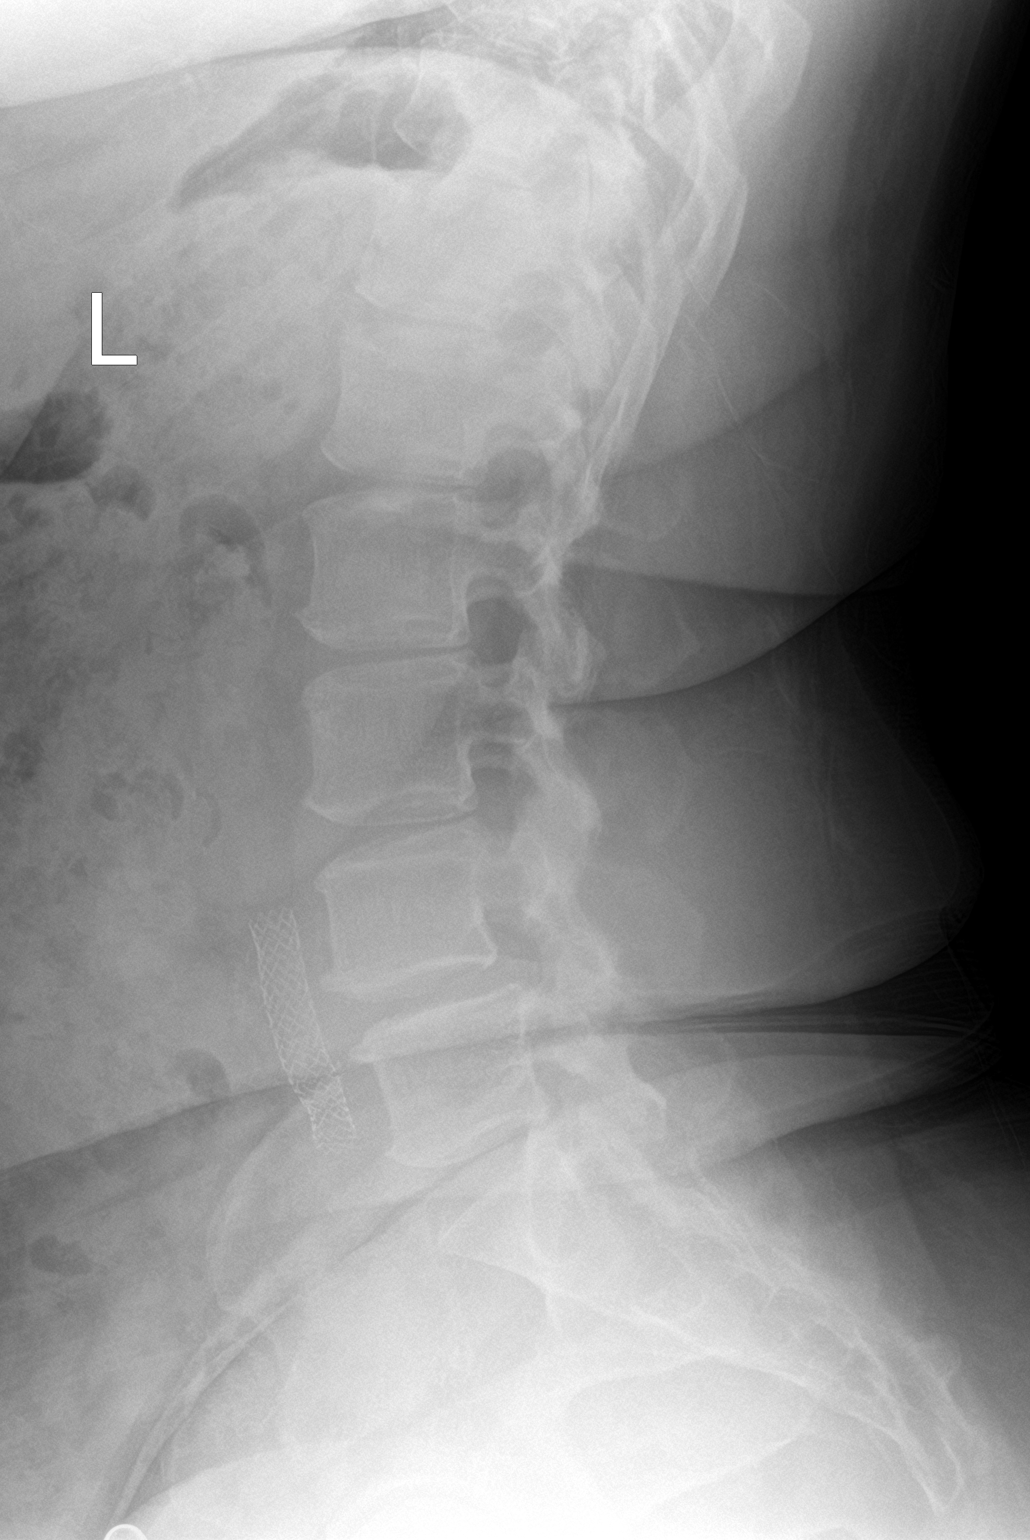

[2 of 2 positions shown; findings below may reference images not displayed]

FINDINGS: There is no evidence of lumbar spine fracture. First degree
anterolisthesis of L4 on L5 noted. Left common iliac artery stent
identified. Mild multilevel disc space narrowing and ventral
endplate spurring.
IMPRESSION: 1. No acute findings.
2. First degree anterolisthesis of L4 on L5.
3. Mild degenerative disc disease.

## 2023-01-29 ENCOUNTER — Emergency Department (HOSPITAL_COMMUNITY): Payer: 59

## 2023-01-29 ENCOUNTER — Inpatient Hospital Stay (HOSPITAL_COMMUNITY): Payer: 59

## 2023-01-29 ENCOUNTER — Inpatient Hospital Stay (HOSPITAL_COMMUNITY)
Admission: EM | Admit: 2023-01-29 | Discharge: 2023-01-31 | DRG: 292 | Disposition: A | Discharge status: Home / Self Care | Payer: 59 | Attending: Internal Medicine | Admitting: Internal Medicine

## 2023-01-29 ENCOUNTER — Encounter (HOSPITAL_COMMUNITY): Payer: Self-pay

## 2023-01-29 ENCOUNTER — Other Ambulatory Visit: Payer: Self-pay

## 2023-01-29 DIAGNOSIS — Z85850 Personal history of malignant neoplasm of thyroid: Secondary | ICD-10-CM

## 2023-01-29 DIAGNOSIS — R112 Nausea with vomiting, unspecified: Secondary | ICD-10-CM | POA: Diagnosis not present

## 2023-01-29 DIAGNOSIS — Z85828 Personal history of other malignant neoplasm of skin: Secondary | ICD-10-CM | POA: Diagnosis not present

## 2023-01-29 DIAGNOSIS — E1151 Type 2 diabetes mellitus with diabetic peripheral angiopathy without gangrene: Secondary | ICD-10-CM | POA: Diagnosis present

## 2023-01-29 DIAGNOSIS — Z7901 Long term (current) use of anticoagulants: Secondary | ICD-10-CM

## 2023-01-29 DIAGNOSIS — I2510 Atherosclerotic heart disease of native coronary artery without angina pectoris: Secondary | ICD-10-CM | POA: Diagnosis present

## 2023-01-29 DIAGNOSIS — J439 Emphysema, unspecified: Secondary | ICD-10-CM | POA: Diagnosis present

## 2023-01-29 DIAGNOSIS — Z86718 Personal history of other venous thrombosis and embolism: Secondary | ICD-10-CM

## 2023-01-29 DIAGNOSIS — R1033 Periumbilical pain: Secondary | ICD-10-CM | POA: Diagnosis not present

## 2023-01-29 DIAGNOSIS — Z8249 Family history of ischemic heart disease and other diseases of the circulatory system: Secondary | ICD-10-CM

## 2023-01-29 DIAGNOSIS — K56600 Partial intestinal obstruction, unspecified as to cause: Secondary | ICD-10-CM | POA: Diagnosis present

## 2023-01-29 DIAGNOSIS — I130 Hypertensive heart and chronic kidney disease with heart failure and stage 1 through stage 4 chronic kidney disease, or unspecified chronic kidney disease: Principal | ICD-10-CM | POA: Diagnosis present

## 2023-01-29 DIAGNOSIS — Z7984 Long term (current) use of oral hypoglycemic drugs: Secondary | ICD-10-CM | POA: Diagnosis not present

## 2023-01-29 DIAGNOSIS — E785 Hyperlipidemia, unspecified: Secondary | ICD-10-CM | POA: Diagnosis present

## 2023-01-29 DIAGNOSIS — E1122 Type 2 diabetes mellitus with diabetic chronic kidney disease: Secondary | ICD-10-CM | POA: Diagnosis present

## 2023-01-29 DIAGNOSIS — Z7902 Long term (current) use of antithrombotics/antiplatelets: Secondary | ICD-10-CM

## 2023-01-29 DIAGNOSIS — F17210 Nicotine dependence, cigarettes, uncomplicated: Secondary | ICD-10-CM | POA: Diagnosis present

## 2023-01-29 DIAGNOSIS — I959 Hypotension, unspecified: Secondary | ICD-10-CM | POA: Diagnosis present

## 2023-01-29 DIAGNOSIS — E279 Disorder of adrenal gland, unspecified: Secondary | ICD-10-CM | POA: Diagnosis present

## 2023-01-29 DIAGNOSIS — E860 Dehydration: Secondary | ICD-10-CM | POA: Diagnosis present

## 2023-01-29 DIAGNOSIS — M1990 Unspecified osteoarthritis, unspecified site: Secondary | ICD-10-CM | POA: Diagnosis present

## 2023-01-29 DIAGNOSIS — Z9851 Tubal ligation status: Secondary | ICD-10-CM

## 2023-01-29 DIAGNOSIS — E890 Postprocedural hypothyroidism: Secondary | ICD-10-CM | POA: Diagnosis present

## 2023-01-29 DIAGNOSIS — N183 Chronic kidney disease, stage 3 unspecified: Secondary | ICD-10-CM | POA: Diagnosis present

## 2023-01-29 DIAGNOSIS — R55 Syncope and collapse: Secondary | ICD-10-CM | POA: Diagnosis present

## 2023-01-29 DIAGNOSIS — Z1152 Encounter for screening for COVID-19: Secondary | ICD-10-CM

## 2023-01-29 DIAGNOSIS — E876 Hypokalemia: Secondary | ICD-10-CM | POA: Diagnosis present

## 2023-01-29 DIAGNOSIS — Z91030 Bee allergy status: Secondary | ICD-10-CM

## 2023-01-29 DIAGNOSIS — K219 Gastro-esophageal reflux disease without esophagitis: Secondary | ICD-10-CM | POA: Diagnosis present

## 2023-01-29 DIAGNOSIS — Z91148 Patient's other noncompliance with medication regimen for other reason: Secondary | ICD-10-CM

## 2023-01-29 DIAGNOSIS — Z8673 Personal history of transient ischemic attack (TIA), and cerebral infarction without residual deficits: Secondary | ICD-10-CM

## 2023-01-29 DIAGNOSIS — Z7982 Long term (current) use of aspirin: Secondary | ICD-10-CM

## 2023-01-29 DIAGNOSIS — Z79899 Other long term (current) drug therapy: Secondary | ICD-10-CM

## 2023-01-29 LAB — COMPREHENSIVE METABOLIC PANEL
ALT: 41 U/L (ref 0–44)
AST: 45 U/L — ABNORMAL HIGH (ref 15–41)
Albumin: 4.1 g/dL (ref 3.5–5.0)
Alkaline Phosphatase: 64 U/L (ref 38–126)
Anion gap: 11 (ref 5–15)
BUN: 14 mg/dL (ref 6–20)
CO2: 21 mmol/L — ABNORMAL LOW (ref 22–32)
Calcium: 8.9 mg/dL (ref 8.9–10.3)
Chloride: 103 mmol/L (ref 98–111)
Creatinine, Ser: 1.71 mg/dL — ABNORMAL HIGH (ref 0.44–1.00)
GFR, Estimated: 35 mL/min — ABNORMAL LOW (ref >60)
Glucose, Bld: 105 mg/dL — ABNORMAL HIGH (ref 70–99)
Potassium: 3.3 mmol/L — ABNORMAL LOW (ref 3.5–5.1)
Sodium: 135 mmol/L (ref 135–145)
Total Bilirubin: 0.9 mg/dL (ref 0.3–1.2)
Total Protein: 7.3 g/dL (ref 6.5–8.1)

## 2023-01-29 LAB — CBG MONITORING, ED: Glucose-Capillary: 106 mg/dL — ABNORMAL HIGH (ref 70–99)

## 2023-01-29 LAB — CBC
HCT: 34.8 % — ABNORMAL LOW (ref 36.0–46.0)
Hemoglobin: 11.8 g/dL — ABNORMAL LOW (ref 12.0–15.0)
MCH: 30.6 pg (ref 26.0–34.0)
MCHC: 33.9 g/dL (ref 30.0–36.0)
MCV: 90.2 fL (ref 80.0–100.0)
Platelets: 243 10*3/uL (ref 150–400)
RBC: 3.86 MIL/uL — ABNORMAL LOW (ref 3.87–5.11)
RDW: 16.3 % — ABNORMAL HIGH (ref 11.5–15.5)
WBC: 8.6 10*3/uL (ref 4.0–10.5)
nRBC: 0 % (ref 0.0–0.2)

## 2023-01-29 LAB — GLUCOSE, CAPILLARY
Glucose-Capillary: 110 mg/dL — ABNORMAL HIGH (ref 70–99)
Glucose-Capillary: 109 mg/dL — ABNORMAL HIGH (ref 70–99)

## 2023-01-29 LAB — RESP PANEL BY RT-PCR (RSV, FLU A&B, COVID)  RVPGX2
Influenza A by PCR: NEGATIVE
Influenza B by PCR: NEGATIVE
Resp Syncytial Virus by PCR: NEGATIVE
SARS Coronavirus 2 by RT PCR: NEGATIVE

## 2023-01-29 LAB — LIPASE, BLOOD: Lipase: 32 U/L (ref 11–51)

## 2023-01-29 MED ORDER — ATORVASTATIN CALCIUM 40 MG PO TABS
40.0000 mg | ORAL_TABLET | Freq: Every day | ORAL | Status: DC
  Administered 2023-01-29 – 2023-01-31 (×3): 40 mg via ORAL
  Filled 2023-01-29 (×3): qty 1

## 2023-01-29 MED ORDER — GABAPENTIN 300 MG PO CAPS
300.0000 mg | ORAL_CAPSULE | Freq: Three times a day (TID) | ORAL | Status: DC
  Administered 2023-01-30 – 2023-01-31 (×4): 300 mg via ORAL
  Filled 2023-01-29 (×4): qty 1

## 2023-01-29 MED ORDER — ACETAMINOPHEN 325 MG PO TABS
650.0000 mg | ORAL_TABLET | Freq: Four times a day (QID) | ORAL | Status: DC | PRN
  Administered 2023-01-29: 650 mg via ORAL
  Filled 2023-01-29: qty 2

## 2023-01-29 MED ORDER — OXYBUTYNIN CHLORIDE ER 10 MG PO TB24
10.0000 mg | ORAL_TABLET | Freq: Every day | ORAL | Status: DC
  Administered 2023-01-29 – 2023-01-31 (×3): 10 mg via ORAL
  Filled 2023-01-29 (×3): qty 1

## 2023-01-29 MED ORDER — MELOXICAM 7.5 MG PO TABS
7.5000 mg | ORAL_TABLET | Freq: Every day | ORAL | Status: DC
  Administered 2023-01-29 – 2023-01-31 (×3): 7.5 mg via ORAL
  Filled 2023-01-29 (×3): qty 1

## 2023-01-29 MED ORDER — POTASSIUM CHLORIDE 20 MEQ PO PACK
40.0000 meq | PACK | Freq: Once | ORAL | Status: AC
  Administered 2023-01-29: 40 meq via ORAL
  Filled 2023-01-29: qty 2

## 2023-01-29 MED ORDER — ASPIRIN 81 MG PO TBEC
81.0000 mg | DELAYED_RELEASE_TABLET | Freq: Every day | ORAL | Status: DC
  Administered 2023-01-29 – 2023-01-31 (×3): 81 mg via ORAL
  Filled 2023-01-29 (×3): qty 1

## 2023-01-29 MED ORDER — SENNA 8.6 MG PO TABS
1.0000 | ORAL_TABLET | Freq: Every day | ORAL | Status: DC
  Administered 2023-01-29 – 2023-01-31 (×3): 8.6 mg via ORAL
  Filled 2023-01-29 (×3): qty 1

## 2023-01-29 MED ORDER — LACTATED RINGERS IV BOLUS
1000.0000 mL | Freq: Once | INTRAVENOUS | Status: AC
  Administered 2023-01-29: 1000 mL via INTRAVENOUS

## 2023-01-29 MED ORDER — IOHEXOL 350 MG/ML SOLN
60.0000 mL | Freq: Once | INTRAVENOUS | Status: AC | PRN
  Administered 2023-01-29: 60 mL via INTRAVENOUS

## 2023-01-29 MED ORDER — POLYETHYLENE GLYCOL 3350 17 G PO PACK
17.0000 g | PACK | Freq: Every day | ORAL | Status: DC
  Administered 2023-01-29 – 2023-01-31 (×3): 17 g via ORAL
  Filled 2023-01-29 (×3): qty 1

## 2023-01-29 MED ORDER — APIXABAN 5 MG PO TABS: 5.0000 mg | ORAL_TABLET | Freq: Two times a day (BID) | ORAL | Status: DC

## 2023-01-29 MED ORDER — ONDANSETRON HCL 4 MG PO TABS
4.0000 mg | ORAL_TABLET | Freq: Three times a day (TID) | ORAL | Status: DC | PRN
  Administered 2023-01-29: 4 mg via ORAL
  Filled 2023-01-29: qty 1

## 2023-01-29 MED ORDER — ENOXAPARIN SODIUM 40 MG/0.4ML IJ SOSY
40.0000 mg | PREFILLED_SYRINGE | INTRAMUSCULAR | Status: DC
  Administered 2023-01-29 – 2023-01-30 (×2): 40 mg via SUBCUTANEOUS
  Filled 2023-01-29 (×2): qty 0.4

## 2023-01-29 MED ORDER — HYDROXYZINE HCL 25 MG PO TABS
50.0000 mg | ORAL_TABLET | Freq: Three times a day (TID) | ORAL | Status: DC
  Administered 2023-01-30 – 2023-01-31 (×4): 50 mg via ORAL
  Filled 2023-01-29 (×4): qty 2

## 2023-01-29 MED ORDER — ACETAMINOPHEN 500 MG PO TABS
1000.0000 mg | ORAL_TABLET | Freq: Four times a day (QID) | ORAL | Status: DC
  Administered 2023-01-30 – 2023-01-31 (×5): 1000 mg via ORAL
  Filled 2023-01-29 (×6): qty 2

## 2023-01-29 MED ORDER — HYDROMORPHONE HCL 1 MG/ML IJ SOLN
0.5000 mg | Freq: Once | INTRAMUSCULAR | Status: AC
  Administered 2023-01-29: 0.5 mg via INTRAVENOUS
  Filled 2023-01-29: qty 0.5

## 2023-01-29 MED ORDER — POLYETHYLENE GLYCOL 3350 17 GM/SCOOP PO POWD
1.0000 | Freq: Once | ORAL | Status: DC
  Filled 2023-01-29: qty 255

## 2023-01-29 NOTE — ED Triage Notes (Addendum)
Pt BIB [facility] coming from home. EMS report pt was found unresponsive on the ground. When pt woke up pt was initially confused. EMS placed pt on 2L via N/[place]. Episode was unwitnessed and 9-1-1 was activated by her 57 y/o grandson. Pt A/O4 on arrival. Pt c/o abd pain. Pt is on anticoagulants.   EMS vitals  CBG 128 120/100 RR 18 SpO2 90% on R/A

## 2023-01-29 NOTE — ED Notes (Signed)
ED TO INPATIENT HANDOFF REPORT  ED Nurse Name and Phone #: Britt Boozer [phone]  S Name/Age/Gender Jordan Harvey 57 y.o. female Room/Bed: 006C/006C  Code Status   Code Status: Full Code  Home/SNF/Other Home Patient oriented to: self, place, time, and situation Is this baseline? Yes   Triage Complete: Triage complete  Chief Complaint Syncope and collapse [R55]  Triage Note Pt BIB [facility] coming from home. EMS report pt was found unresponsive on the ground. When pt woke up pt was initially confused. EMS placed pt on 2L via N/C. Episode was unwitnessed and 9-1-1 was activated by her 35 y/o grandson. Pt A/O4 on arrival. Pt c/o abd pain. Pt is on anticoagulants.   EMS vitals  CBG 128 120/100 RR 18 SpO2 90% on R/A   Allergies Allergies  Allergen Reactions   Bee Venom Shortness Of Breath    Level of Care/Admitting Diagnosis ED Disposition     ED Disposition  Admit   Condition  --   Comment  Hospital Area: [facility] [[id number]]  Level of Care: Telemetry Medical [104]  May admit patient to [facility] or [facility] if equivalent level of care is available:: No  Covid Evaluation: Asymptomatic - no recent exposure (last 10 days) testing not required  Diagnosis: Syncope and collapse [780.2.ICD-9-CM]  Admitting Physician: Gust Rung [[id number]]  Attending Physician: Gust Rung [phone]  Certification:: I certify this patient will need inpatient services for at least 2 midnights  Expected Medical Readiness: 01/31/2023          B Medical/Surgery History Past Medical History:  Diagnosis Date   Anxiety    CAD (coronary artery disease)    Cancer of thyroid (HCC)    CKD (chronic kidney disease), stage III (HCC)    COPD (chronic obstructive pulmonary disease) (HCC)    Diabetes mellitus without complication (HCC)    DVT (deep venous thrombosis) (HCC)    Emphysema lung (HCC)    GERD (gastroesophageal reflux disease)    Heart failure (HCC)    CHF    Hyperlipidemia    Hypertension    Insomnia    MDD (major depressive disorder)    Osteoarthritis    PAD (peripheral artery disease) (HCC)    Skin cancer    cheek, squamous cell   Subarachnoid hemorrhage (HCC)    Thyroid cancer (HCC)    Thyroid disease    hypo   TIA (transient ischemic attack)    Vitreous floaters of left eye    Past Surgical History:  Procedure Laterality Date   CARDIAC CATHETERIZATION N/A 01/09/2016   Procedure: Left Heart Cath and Coronary Angiography;  Surgeon: Lyn Records, MD;  Location: [facility] INVASIVE CV LAB;  Service: Cardiovascular;  Laterality: N/A;   CESAREAN SECTION     EYE SURGERY     INSERTION OF ILIAC STENT Left 01/09/2016   Procedure: Left Femoral Artery Exposure with INSERTION OF LEFT ILIAC STENT;  Surgeon: Nada Libman, MD;  Location: [facility] OR;  Service: Vascular;  Laterality: Left;   THYROID SURGERY  2008   Not sure what they did   TUBAL LIGATION  1991     A IV Location/Drains/Wounds Patient Lines/Drains/Airways Status     Active Line/Drains/Airways     Name Placement date Placement time Site Days   Peripheral IV 01/29/23 20 G Left Antecubital 01/29/23  1233  Antecubital  less than 1   Incision (Closed) 01/09/16 Groin Left 01/09/16  1752  -- 4782  Intake/Output Last 24 hours  Intake/Output Summary (Last 24 hours) at 01/29/2023 1520 Last data filed at 01/29/2023 1454 Gross per 24 hour  Intake 1000 ml  Output --  Net 1000 ml    Labs/Imaging Results for orders placed or performed during the hospital encounter of 01/29/23 (from the past 48 hour(s))  CBG monitoring, ED     Status: Abnormal   Collection Time: 01/29/23 11:38 AM  Result Value Ref Range   Glucose-Capillary 106 (H) 70 - 99 mg/dL    Comment: Glucose reference range applies only to samples taken after fasting for at least 8 hours.  CBC     Status: Abnormal   Collection Time: 01/29/23 12:35 PM  Result Value Ref Range   WBC 8.6 4.0 - 10.5 K/uL   RBC 3.86 (L) 3.87  - 5.11 MIL/uL   Hemoglobin 11.8 (L) 12.0 - 15.0 g/dL   HCT 40.9 (L) 81.1 - 91.4 %   MCV 90.2 80.0 - 100.0 fL   MCH 30.6 26.0 - 34.0 pg   MCHC 33.9 30.0 - 36.0 g/dL   RDW 78.2 (H) 95.6 - 21.3 %   Platelets 243 150 - 400 K/uL   nRBC 0.0 0.0 - 0.2 %    Comment: Performed at [facility], [address], [place], [place] [address]  Resp panel by RT-PCR (RSV, Flu A&B, Covid) Anterior Nasal Swab     Status: None   Collection Time: 01/29/23 12:35 PM   Specimen: Anterior Nasal Swab  Result Value Ref Range   SARS Coronavirus 2 by RT PCR NEGATIVE NEGATIVE   Influenza A by PCR NEGATIVE NEGATIVE   Influenza B by PCR NEGATIVE NEGATIVE    Comment: (NOTE) The Xpert Xpress SARS-CoV-2/FLU/RSV plus assay is intended as an aid in the diagnosis of influenza from Nasopharyngeal swab specimens and should not be used as a sole basis for treatment. Nasal washings and aspirates are unacceptable for Xpert Xpress SARS-CoV-2/FLU/RSV testing.  Fact Sheet for Patients: BloggerCourse.com  Fact Sheet for Healthcare Providers: SeriousBroker.it  This test is not yet approved or cleared by the [place] FDA and has been authorized for detection and/or diagnosis of SARS-CoV-2 by FDA under an Emergency Use Authorization (EUA). This EUA will remain in effect (meaning this test can be used) for the duration of the COVID-19 declaration under Section 564(b)(1) of the Act, 21 U.S.C. section 360bbb-3(b)(1), unless the authorization is terminated or revoked.     Resp Syncytial Virus by PCR NEGATIVE NEGATIVE    Comment: (NOTE) Fact Sheet for Patients: BloggerCourse.com  Fact Sheet for Healthcare Providers: SeriousBroker.it  This test is not yet approved or cleared by the [place] FDA and has been authorized for detection and/or diagnosis of SARS-CoV-2 by FDA under an Emergency Use Authorization (EUA).  This EUA will remain in effect (meaning this test can be used) for the duration of the COVID-19 declaration under Section 564(b)(1) of the Act, 21 U.S.C. section 360bbb-3(b)(1), unless the authorization is terminated or revoked.  Performed at [facility], [address], [place], [place] [address]   Comprehensive metabolic panel     Status: Abnormal   Collection Time: 01/29/23 12:35 PM  Result Value Ref Range   Sodium 135 135 - 145 mmol/L   Potassium 3.3 (L) 3.5 - 5.1 mmol/L   Chloride 103 98 - 111 mmol/L   CO2 21 (L) 22 - 32 mmol/L   Glucose, Bld 105 (H) 70 - 99 mg/dL    Comment: Glucose  reference range applies only to samples taken after fasting for at least 8 hours.   BUN 14 6 - 20 mg/dL   Creatinine, Ser 0.86 (H) 0.44 - 1.00 mg/dL   Calcium 8.9 8.9 - 57.8 mg/dL   Total Protein 7.3 6.5 - 8.1 g/dL   Albumin 4.1 3.5 - 5.0 g/dL   AST 45 (H) 15 - 41 U/L   ALT 41 0 - 44 U/L   Alkaline Phosphatase 64 38 - 126 U/L   Total Bilirubin 0.9 0.3 - 1.2 mg/dL   GFR, Estimated 35 (L) >60 mL/min    Comment: (NOTE) Calculated using the CKD-EPI Creatinine Equation (2021)    Anion gap 11 5 - 15    Comment: Performed at [facility], [address] [place], [place] [address]  Lipase, blood     Status: None   Collection Time: 01/29/23 12:35 PM  Result Value Ref Range   Lipase 32 11 - 51 U/L    Comment: Performed at [facility], [address], [place], [place] [address]   CT Head Wo Contrast  Result Date: 01/29/2023 CLINICAL DATA:  Head trauma, moderate to severe. Unexplained loss of consciousness. EXAM: CT HEAD WITHOUT CONTRAST TECHNIQUE: Contiguous axial images were obtained from the base of the skull through the vertex without intravenous contrast. RADIATION DOSE REDUCTION: This exam was performed according to the departmental dose-optimization program which includes automated exposure control, adjustment of the mA and/or kV according to patient size and/or use of  iterative reconstruction technique. COMPARISON:  05/07/2022 FINDINGS: Brain: Scattered intraventricular calcifications without hydrocephalus or change. Mild low-density in the right frontal white matter, likely from old ventriculostomy. Accounting for streak artifact there is no convincing infarct. No hemorrhage, hydrocephalus, or collection. Vascular: No hyperdense vessel or unexpected calcification. Aneurysm coil mass along the left circle-of-Willis. Skull: Negative for fracture or bone lesion. Sinuses/Orbits: Right enucleation with prosthesis. Left cataract resection. IMPRESSION: No acute or interval finding. Electronically Signed   By: Tiburcio Pea M.D.   On: 01/29/2023 12:37    Pending Labs Unresulted Labs (From admission, onward)     Start     Ordered   01/29/23 1511  HIV Antibody (routine testing w rflx)  (HIV Antibody (Routine testing w reflex) panel)  Once,   R        01/29/23 1512   01/29/23 1142  Urinalysis, Routine w reflex microscopic -Urine, Clean Catch  Once,   URGENT       Question:  Specimen Source  Answer:  Urine, Clean Catch   01/29/23 1141            Vitals/Pain Today's Vitals   01/29/23 1310 01/29/23 1400 01/29/23 1502 01/29/23 1515  BP: 95/69 99/73 101/87 118/82  Pulse: 67 67 67   Resp: 12 10 16 14   Temp:      TempSrc:      SpO2: 100% 99% 96%   Weight:      Height:      PainSc:        Isolation Precautions No active isolations  Medications Medications  lactated ringers bolus 1,000 mL (has no administration in time range)  lactated ringers bolus 1,000 mL (0 mLs Intravenous Stopped 01/29/23 1454)    Mobility walks     R Recommendations: See Admitting Provider Note  Report given to:   Additional Notes: Found down by grandson, confused with EMS, A&Ox4 now, was hypotension and brady, no longer either, c/o abd pain, providers aware.

## 2023-01-29 NOTE — H&P (Cosign Needed Addendum)
Date: 01/29/2023               Patient Name:  Jordan Harvey MRN: [id number]  DOB: 1966/05/12 Age / Sex: 57 y.o., female   PCP: Raymon Mutton., FNP         Medical Service: Internal Medicine Teaching Service         Attending Physician: Dr. Gust Rung, DO      First Contact: Dr. Laretta Bolster, MD Pager [phone]    Second Contact: Dr. 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 Presented to the ED on 01/29/2023 via EMS after found on the toilet by her grandson. There where no witness to her syncopal event.  She states she does not know what happened, but heard from from her grandson she was moaning on the toilet and unaware of the events. She denies any history of similar events and denies any seizure history. States last bowel movement was 2-3 days ago and was constipated then as well, hard stool no red or black discoloration.  Patient states she ran out of all of her medications about 4 days ago, during this time she also has had no energy, has been feeling cold, has been constipated and endorses that she has not had an appetite because all food tastes bland. She states he has had on and off pain and pointed to her belly button stated it was 10/10 intensity and localized to the area without radiation.   ED Course: Initial triage patient AxO4, vitals wnl, back to baseline, Heart rate later found to drip to 30s, BP dropped 83/50 patient received a liter of LR fluids raising BP in 95/69 CMP showed K of 3.3, sodium 135, chloride 103, glucose of 105,  creatinine 1.71 Lipase 32 CBC - wbc 8.6, hb 11.8, hct 34.8, platlets 243 Viral panel- negative for flu covid and rsv EKGs- obtained and normal sinus rhythm  Images- CT head ordered no acute changes,    Meds:  Current Meds  Medication Sig   acetaminophen (TYLENOL) 325 MG tablet Take 2 tablets (650 mg total) by mouth every 6 (six) hours as needed.   amLODipine (NORVASC) 10 MG tablet Take 10 mg by mouth daily.   aspirin EC 81 MG EC tablet Take 1 tablet (81 mg total) by mouth daily.   atorvastatin (LIPITOR) 20 MG tablet Take 2 tablets (40 mg total) by mouth daily. Reported on 12/11/2015   carvedilol (COREG) 6.25 MG tablet Take 6.25 mg by mouth 2 (two) times daily with a meal.   doxazosin (CARDURA) 1 MG tablet Take 1 mg by mouth 2 (two) times daily.   ELIQUIS 5 MG TABS tablet Take 5 mg by mouth 2 (two) times daily.   gabapentin (NEURONTIN) 300 MG capsule Take 300 mg by mouth 3 (three) times daily.   hydrochlorothiazide (HYDRODIURIL) 25 MG tablet Take 25 mg by mouth daily.   hydrOXYzine (ATARAX) 50 MG tablet Take 50 mg by mouth 3 (three) times daily.   losartan (COZAAR) 100 MG tablet Take 1 tablet (100 mg  total) by mouth daily.   meloxicam (MOBIC) 7.5 MG tablet Take 7.5 mg by mouth daily.   montelukast (SINGULAIR) 10 MG tablet Take 10 mg by mouth at bedtime.   oxybutynin (DITROPAN-XL) 10 MG 24 hr tablet Take 10 mg by mouth daily.   pantoprazole (PROTONIX) 40 MG tablet Take by mouth daily.   venlafaxine XR (EFFEXOR-XR) 37.5 MG 24 hr capsule Take 37.5 mg by mouth daily.  Xanax .5mg   Amlodipine 10mg   Aspirin 81mg Lipitor 20mg  Chlorthalidone 25mg   Plavix 75mg   Flexeril 5mg   Lasix 20mg   Doxazosin 1mg   Losartan 100mg   Metformin 500mg  BID  Seroquel 25mg    Past Medical History  Past Surgical History:  Procedure Laterality Date   CARDIAC CATHETERIZATION N/A 01/09/2016   Procedure: Left Heart Cath and Coronary Angiography;  Surgeon: Lyn Records, MD;  Location: [facility] INVASIVE CV LAB;  Service:  Cardiovascular;  Laterality: N/A;   CESAREAN SECTION     EYE SURGERY     INSERTION OF ILIAC STENT Left 01/09/2016   Procedure: Left Femoral Artery Exposure with INSERTION OF LEFT ILIAC STENT;  Surgeon: Nada Libman, MD;  Location: [facility] OR;  Service: Vascular;  Laterality: Left;   THYROID SURGERY  2008   Not sure what they did   TUBAL LIGATION  1991   Past Surgical History:  Procedure Laterality Date   CARDIAC CATHETERIZATION N/A 01/09/2016   Procedure: Left Heart Cath and Coronary Angiography;  Surgeon: Lyn Records, MD;  Location: [facility] INVASIVE CV LAB;  Service: Cardiovascular;  Laterality: N/A;   CESAREAN SECTION     EYE SURGERY     INSERTION OF ILIAC STENT Left 01/09/2016   Procedure: Left Femoral Artery Exposure with INSERTION OF LEFT ILIAC STENT;  Surgeon: Nada Libman, MD;  Location: [facility] OR;  Service: Vascular;  Laterality: Left;   THYROID SURGERY  2008   Not sure what they did   TUBAL LIGATION  1991     Social:  Lives With: alone in an apartment with grandson Occupation: PCA takes care of patients Support: Her guy friend  Level of Function: Normally independent with all ADLs PCP: unknown Substances: 1 pack per 2 weeks for last 15 years. Does not drink alcohol, never done cocaine, smokes marijuana reccreationally.  Travel: None  Social History   Tobacco Use   Smoking status: Light Smoker    Current packs/day: 0.25    Types: Cigarettes   Smokeless tobacco: Never   Tobacco comments:    1/4 pack or less per day according to patient  Substance Use Topics   Alcohol use: No   Drug use: Yes    Frequency: 1.0 times per week    Types: Marijuana     Family History:  Family History  Problem Relation Age of Onset   Breast cancer Mother    CAD Sister 77       MI     Allergies: Allergies as of 01/29/2023 - Review Complete 01/29/2023  Allergen Reaction Noted   Bee venom Shortness Of Breath 04/11/2012    Review of Systems: A complete ROS was negative except as per HPI.   Review of Systems  Constitutional:  Positive for malaise/fatigue and weight loss.  HENT: Negative.    Eyes:  Negative for blurred vision, double vision and pain.       Patient wearing artificial right eye  Cardiovascular:  Negative for chest pain, palpitations and leg swelling.  Gastrointestinal:  Positive for abdominal pain, heartburn, nausea and vomiting. Negative for  blood in stool, diarrhea and melena.  Genitourinary: Negative.  Negative for flank pain, frequency and urgency.  Neurological:  Positive for loss of consciousness. Negative for speech change, focal weakness, seizures, weakness and headaches.  Psychiatric/Behavioral:  Positive for substance abuse.     OBJECTIVE:   Physical Exam: Blood pressure 118/82, pulse 67, temperature 97.6 F (36.4 C), temperature source Oral, resp. rate 14, height 5\' 4"  (1.626 m), weight 90.7 kg, last menstrual period 03/21/2014, SpO2 96%.  Constitutional: Chronically ill, well developed female, Appears older than stated age, lying in bed, in no acute distress HENT: normocephalic atraumatic, mucous membranes moist Eyes: Right eye artificial, left reactive to light  Neck: supple Cardiovascular: regular rate and rhythm, no m/r/g Pulmonary/Chest: normal work of breathing on room air, lungs clear to auscultation bilaterally Abdominal: Large, soft, non distended, non tender to palpation, no rebound or guarding  MSK: normal bulk and tone Neurological: alert & oriented to person time and place, Left eye reactive to light and normal EOM, symmetrical strength in BL upper and lower extremity Skin: warm and dry  Labs: CBC    Component Value Date/Time   WBC 8.6 01/29/2023 1235   RBC 3.86 (L) 01/29/2023 1235   HGB 11.8 (L) 01/29/2023 1235   HCT 34.8 (L) 01/29/2023 1235   PLT 243 01/29/2023 1235   MCV 90.2 01/29/2023 1235   MCH 30.6 01/29/2023 1235   MCHC 33.9 01/29/2023 1235   RDW 16.3 (H) 01/29/2023 1235   LYMPHSABS 2.5 05/07/2022 2001   MONOABS  0.4 05/07/2022 2001   EOSABS 0.2 05/07/2022 2001   BASOSABS 0.1 05/07/2022 2001     CMP     Component Value Date/Time   NA 135 01/29/2023 1235   K 3.3 (L) 01/29/2023 1235   CL 103 01/29/2023 1235   CO2 21 (L) 01/29/2023 1235   GLUCOSE 105 (H) 01/29/2023 1235   BUN 14 01/29/2023 1235   CREATININE 1.71 (H) 01/29/2023 1235   CALCIUM 8.9 01/29/2023 1235   PROT 7.3 01/29/2023 1235   ALBUMIN 4.1 01/29/2023 1235   AST 45 (H) 01/29/2023 1235   ALT 41 01/29/2023 1235   ALKPHOS 64 01/29/2023 1235   BILITOT 0.9 01/29/2023 1235   GFRNONAA 35 (L) 01/29/2023 1235   GFRAA >60 09/04/2019 1212  Lipase     Component Value Date/Time   LIPASE 32 01/29/2023 1235   CBG (last 3)  Recent Labs    01/29/23 1138  GLUCAP 106*     Imaging: CT Head Wo Contrast  Result Date: 01/29/2023 CLINICAL DATA:  Head trauma, moderate to severe. Unexplained loss of consciousness. EXAM: CT HEAD WITHOUT CONTRAST TECHNIQUE: Contiguous axial images were obtained from the base of the skull through the vertex without intravenous contrast. RADIATION DOSE REDUCTION: This exam was performed according to the departmental dose-optimization program which includes automated exposure control, adjustment of the mA and/or kV according to patient size and/or use of iterative reconstruction technique. COMPARISON:  05/07/2022 FINDINGS: Brain: Scattered intraventricular calcifications without hydrocephalus or change. Mild low-density in the right frontal white matter, likely from old ventriculostomy. Accounting for streak artifact there is no convincing infarct. No hemorrhage, hydrocephalus, or collection. Vascular: No hyperdense vessel or unexpected calcification. Aneurysm coil mass along the left circle-of-Willis. Skull: Negative for fracture or bone lesion. Sinuses/Orbits: Right enucleation with prosthesis. Left cataract resection. IMPRESSION: No acute or interval finding. Electronically Signed   By: Tiburcio Pea M.D.   On:  01/29/2023 12:37     EKG: personally reviewed my  interpretation is rate of about 60/regular ventricular rate, normal sinus rhythm. Prologed PR interval without dropped beats, QRS with left axis deviation and rightward rotations seen in lateral precordial leads. No evidence of LVH, some minimal voltage criteria.  Prior EKG was observed and appears the changes are chronic and seen in old ekgs evidence of gradually prolonging PR interval   EKG Interpretation Date/Time:  Saturday January 29 2023 11:38:12 EDT Ventricular Rate:  65 PR Interval:  215 QRS Duration:  107 QT Interval:  431 QTC Calculation: 449 R Axis:   -31  Text Interpretation: Sinus or ectopic atrial rhythm Prolonged PR interval Left axis deviation Low voltage, precordial leads Consider anterior infarct Nonspecific T abnormalities, lateral leads No acute changes No significant change since last tracing Confirmed by Derwood Kaplan [phone]) on 01/29/2023 11:43:25 AM   ASSESSMENT & PLAN:   Assessment & Plan by Problem: Principal Problem:   Syncope and collapse   Yanessa Threats is a 57 y.o. 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 Confused about the events surrounding this episode. Out of most meds for about 4 days, has had nausea 1 episode of vomiting prior to arrival. Low likelihood of cardiogenic syncope however PR is prolonged compared to past and patient had a bradycardic and hypotensive episode in the ED that responded to fluids. Differential diagnosis considered includes but is not limited to Sepsis, acute mesenteric ischemia (due to the  pain out of proportion to patients exam), SBO, Diabetic gastroparesis, cardiogenic syncope, vasovagal syncope, orthostatic syncope, ACS, arrhyhtmia, heart block/AV block with the patients prolonging PR interval. Patient still appears dry and volume depleted after 3L of fluids, hypotension likely 2/2 profound dehydration. - Patient on Cardiac telemetry  - EKG obtained and analyzed above - Zofran for nausea (QT interval 431) - Tylenol for pain  - Start clear liquid diet - Orthostatic vitals to r/o orthostatic syncope - Dilaudid for pain management  - Senna 8.6mg  for constipation/bowel regimen  - Order PT/OT therapy  - Obtain CT abdomen with and without contrast (history of adrenal mass on CT, history of cancer, r/o diabetic gastroparesis vs SBO vs illeus, )  # Hypokalemia Patient potassium found to be 3.3 prior to liter of LR bolus.  - Continue to monitor potassium and replete as needed - Continued cardiac monitoring due to syncope, hypotension and bradycardia episode  # Medication Noncompliance  # Hx Thyroid Cancer  # Hx of Throid Surgery # Adrenal Mass/Incidentaloma prior CT  Patient endorses symptoms including being cold, no appetite, no energy, constipated and new alteration in taste. Rule out hypothyroidism and Myxedema coma. - TSH with reflux to T4 to r/o hypothyroidism  - Serum cortisol in am to rule out adrenal insufficiency   # Marijuana Use  # Tobacco Use Patient endorses marijuana use for pain relief, and tobacco use. Denies IV drugs, or other recreations substances, endorses social alcohol use. - Patient out of medications for 4 days, grandson appears withdrawn. - Social consult/TOC for social determinants of health  - Patient lives with grandson and patient - Obtain UDS f/u with results   History of DVT Hx of Subarachnoid  aneurysm s/p coil  - CT head done in ED-No acute or interval finding. - Patient on Eliquis    Chronic Conditions # HLD- home atorvastatin #  Osteoarthritis- home gabapentin, and mobic   Diet: Clear liquid for now  VTE: DOAC IVF: LR,Bolus Code: Full  Prior to Admission Living Arrangement: Apartment alone with grandson  Anticipated Discharge Location: Home Barriers to Discharge: Pending syncope and periumbilical abdominal pain w/u   Dispo: Admit patient to Observation with expected length of stay less than 2 midnights.  Signed: Martie Round, Medical Student  01/29/2023, 3:27 PM   Attestation for Student Documentation:  I personally was present and re-performed the history, physical exam and medical decision-making activities of this service and have verified that the service and findings are accurately documented in the student's note.  Olegario Messier, MD 01/29/2023, 5:20 PM

## 2023-01-29 NOTE — ED Provider Notes (Addendum)
[place] [facility] AT [facility] Provider Note   CSN: [id number] Arrival date & time: 01/29/23  1128     History  No chief complaint on file.   Jordan Harvey is a 57 y.o. 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  She was found unresponsive at home by her 2 year old grandson and he called 911.  She states that before this, she was making pizza and then felt nauseous.  She does not remember passing out.  She states she has not been eating well lately and has been off of her medications for the past 4 days because they were not delivered on time.  She did take her Plavix today.  She notes some epigastric abdominal pain for the past week, no nausea or vomiting, no fever or chills.  She denies any head pain, nausea or vomiting, changes in vision, chest pain, shortness of breath, any other complaints at this time.  She feels at her baseline currently.  Denies any drug use aside from marijuana yesterday.  Denies any alcohol use.  HPI     Home Medications Prior to Admission medications   Medication Sig Start Date End Date Taking? Authorizing Provider  acetaminophen (TYLENOL) 325 MG tablet Take 2 tablets (650 mg total) by mouth every 6 (six) hours as needed. 01/02/17  Yes Maxwell Caul, PA-C  ALPRAZolam Prudy Feeler) 0.5 MG tablet Take 0.5 mg by mouth at bedtime as needed for anxiety.   Yes [provider]  amLODipine (NORVASC) 10 MG tablet Take 10 mg by mouth daily. 09/14/19  Yes [provider]  aspirin EC 81 MG EC tablet Take 1 tablet (81 mg total) by mouth daily. 01/11/16  Yes Ahmed, Elyn Peers, MD  atorvastatin (LIPITOR) 20 MG tablet Take 2 tablets (40 mg total) by mouth daily. Reported on 12/11/2015 04/27/16  Yes Ward, Layla Maw, DO  Azilsartan-Chlorthalidone (EDARBYCLOR) 40-25 MG TABS Take 1  tablet by mouth daily.   Yes [provider]  chlorthalidone (HYGROTON) 25 MG tablet Take 1 tablet (25 mg total) by mouth daily. 04/27/16   Ward, Layla Maw, DO  clopidogrel (PLAVIX) 75 MG tablet Take 1 tablet (75 mg total) by mouth daily. 03/01/19   Nickel, Carma Lair, NP  cyclobenzaprine (FLEXERIL) 5 MG tablet  05/22/19   [provider]  diclofenac Sodium (VOLTAREN) 1 % GEL  05/22/19   [provider]  dicyclomine (BENTYL) 20 MG tablet Take 1 tablet (20 mg total) by mouth 3 (three) times daily before meals. As needed for abdominal cramping 04/27/16   Ward, Layla Maw, DO  doxazosin (CARDURA) 1 MG tablet Take 1 mg by mouth 2 (two) times daily. 08/01/19 07/31/20  [provider]  furosemide (LASIX) 20 MG tablet Take 1 tablet (20 mg total) by mouth daily. 04/27/16   Ward, Layla Maw, DO  losartan (COZAAR) 100 MG tablet Take 1 tablet (100 mg total) by mouth daily. 04/27/16   Ward, Layla Maw, DO  meloxicam (MOBIC) 7.5 MG tablet Take 7.5 mg by mouth daily. 04/27/19   [provider]  metFORMIN (GLUCOPHAGE) 500 MG tablet Take 1 tablet (500 mg total) by mouth daily with breakfast. Reported on 12/11/2015 03/01/17   Nickel, Carma Lair, NP  Naproxen 375 MG TBEC Take 1 tablet (375 mg total) by mouth 2 (two) times daily. 12/07/20   Lorre Munroe,  NP  oxybutynin (DITROPAN-XL) 10 MG 24 hr tablet Take 10 mg by mouth daily.    [provider]  pantoprazole (PROTONIX) 40 MG tablet Take by mouth daily. 04/27/19   [provider]  PROAIR HFA 108 (90 Base) MCG/ACT inhaler Inhale 2 puffs into the lungs 4 (four) times daily as needed. 09/16/17   [provider]  QUEtiapine (SEROQUEL) 25 MG tablet Take 25 mg by mouth at bedtime.    [provider]      Allergies    Bee venom    Review of Systems   Review of Systems  Neurological:  Positive for syncope.    Physical Exam Updated Vital Signs BP 99/73   Pulse 67   Temp 97.6 F (36.4 C) (Oral)    Resp 10   Ht 5\' 4"  (1.626 m)   Wt 90.7 kg   LMP 03/21/2014   SpO2 99%   BMI 34.33 kg/m  Physical Exam Vitals and nursing note reviewed.  Constitutional:      General: She is not in acute distress.    Appearance: She is well-developed.     Comments: Alert and well-appearing  HENT:     Head: Normocephalic and atraumatic.  Eyes:     Conjunctiva/sclera: Conjunctivae normal.     Comments: Has an artificial right eye, left eye with pupillary reflex and EOM intact  Neck:     Comments: Able to rotate neck left and right 45 degrees without difficulty Cardiovascular:     Rate and Rhythm: Normal rate and regular rhythm.     Heart sounds: No murmur heard.    Comments: 2+ radial pulse bilaterally Pulmonary:     Effort: Pulmonary effort is normal. No respiratory distress.     Breath sounds: Normal breath sounds.  Abdominal:     General: Abdomen is flat. Bowel sounds are normal.     Palpations: Abdomen is soft.     Tenderness: There is no abdominal tenderness.     Comments: Nontender to palpation diffusely of the abdomen, no obvious hernias or rashes  Musculoskeletal:        General: No swelling. Normal range of motion.     Cervical back: Normal range of motion and neck supple.     Right lower leg: No edema.     Left lower leg: No edema.     Comments: No tenderness palpation of the spinous processes  Skin:    General: Skin is warm and dry.     Capillary Refill: Capillary refill takes less than 2 seconds.  Neurological:     General: No focal deficit present.     Mental Status: She is alert.     Comments: Alert and oriented x 4 Cranial nerves III through XII intact 5/5 strength in the upper and lower extremities bilaterally, sensation intact in the upper and lower extremities bilaterally  Psychiatric:        Mood and Affect: Mood normal.     ED Results / Procedures / Treatments   Labs (all labs ordered are listed, but only abnormal results are displayed) Labs Reviewed  CBC -  Abnormal; Notable for the following components:      Result Value   RBC 3.86 (*)    Hemoglobin 11.8 (*)    HCT 34.8 (*)    RDW 16.3 (*)    All other components within normal limits  COMPREHENSIVE METABOLIC PANEL - Abnormal; Notable for the following components:   Potassium 3.3 (*)  CO2 21 (*)    Glucose, Bld 105 (*)    Creatinine, Ser 1.71 (*)    AST 45 (*)    GFR, Estimated 35 (*)    All other components within normal limits  CBG MONITORING, ED - Abnormal; Notable for the following components:   Glucose-Capillary 106 (*)    All other components within normal limits  RESP PANEL BY RT-PCR (RSV, FLU A&B, COVID)  RVPGX2  LIPASE, BLOOD  URINALYSIS, ROUTINE W REFLEX MICROSCOPIC  CBG MONITORING, ED    EKG EKG Interpretation Date/Time:  Saturday January 29 2023 11:38:12 EDT Ventricular Rate:  65 PR Interval:  215 QRS Duration:  107 QT Interval:  431 QTC Calculation: 449 R Axis:   -31  Text Interpretation: Sinus or ectopic atrial rhythm Prolonged PR interval Left axis deviation Low voltage, precordial leads Consider anterior infarct Nonspecific T abnormalities, lateral leads No acute changes No significant change since last tracing Confirmed by Derwood Kaplan ([id number]) on 01/29/2023 11:43:25 AM  Radiology CT Head Wo Contrast  Result Date: 01/29/2023 CLINICAL DATA:  Head trauma, moderate to severe. Unexplained loss of consciousness. EXAM: CT HEAD WITHOUT CONTRAST TECHNIQUE: Contiguous axial images were obtained from the base of the skull through the vertex without intravenous contrast. RADIATION DOSE REDUCTION: This exam was performed according to the departmental dose-optimization program which includes automated exposure control, adjustment of the mA and/or kV according to patient size and/or use of iterative reconstruction technique. COMPARISON:  05/07/2022 FINDINGS: Brain: Scattered intraventricular calcifications without hydrocephalus or change. Mild low-density in the right frontal  white matter, likely from old ventriculostomy. Accounting for streak artifact there is no convincing infarct. No hemorrhage, hydrocephalus, or collection. Vascular: No hyperdense vessel or unexpected calcification. Aneurysm coil mass along the left circle-of-Willis. Skull: Negative for fracture or bone lesion. Sinuses/Orbits: Right enucleation with prosthesis. Left cataract resection. IMPRESSION: No acute or interval finding. Electronically Signed   By: Tiburcio Pea M.D.   On: 01/29/2023 12:37    Procedures .Critical Care  Performed by: Arabella Merles, PA-C Authorized by: Arabella Merles, PA-C   Critical care provider statement:    Critical care time (minutes):  30   Critical care was necessary to treat or prevent imminent or life-threatening deterioration of the following conditions: sycope, BP management.   Critical care was time spent personally by me on the following activities:  Development of treatment plan with patient or surrogate, discussions with consultants, evaluation of patient's response to treatment, examination of patient, ordering and review of laboratory studies, ordering and review of radiographic studies, ordering and performing treatments and interventions, pulse oximetry, re-evaluation of patient's condition and review of old charts Comments:     Multiple reevaluations of the patient and blood pressure management     Medications Ordered in ED Medications  lactated ringers bolus 1,000 mL (has no administration in time range)  lactated ringers bolus 1,000 mL (1,000 mLs Intravenous New Bag/Given 01/29/23 1242)    ED Course/ Medical Decision Making/ A&P                                 Medical Decision Making Amount and/or Complexity of Data Reviewed Labs: ordered. Radiology: ordered.  Risk Decision regarding hospitalization.   57 y.o. 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  Her vital signs are all within normal limits.  She was found at home after syncopal event by her grandson and brought in by EMS.  She was making pizza and felt nauseous before the syncopal event, does not remember passing out.  Now feels back to baseline with no complaints aside from some epigastric abdominal pain for the past week.  She has been out of her medications for the past 4 days, and they were just delivered today.  She did take her Plavix today.  CT head was obtained which showed no acute intracranial abnormalities.  CBG at 106, low suspicion for hypoglycemia causing her syncopal event. 12:50 PM I was notified by nursing that the patient's heart rate keeps dropping into the 30s, and her blood pressure has also dropped to 83/50.  She has been started on LR bolus. Will attempt to capture this on EKG, however, current EKG shows normal sinus rhythm with a rate in the 60s. 1:17 PM patient blood pressure coming back up to 95/69 after 1 L LR bolus.  She still endorses some epigastric abdominal pain, will hold off on pain medication currently as renal function is unknown and will avoid opioids at this point given her lower heart rate and blood pressure. Currently stable   Impression: Unwitnessed syncopal event Hypotension  Disposition:  Patient admitted to hospitalist service for further observation and workup  Lab Tests: I Ordered, and personally interpreted labs.  The pertinent results include:   CMP with slightly low potassium at 3.3, elevated creatinine at 1.71 CBC without any leukocytosis, hemoglobin at 11.8 CBG 106 COVID-negative Lipase within normal limits  Imaging Studies ordered: I ordered imaging  studies including CT head I independently visualized the imaging with scope of interpretation limited to determining acute life threatening conditions related to emergency care. Imaging showed no intracranial abnormalities I agree with the radiologist interpretation   Cardiac Monitoring: / EKG: The patient was maintained on a cardiac monitor.  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

## 2023-01-29 NOTE — Hospital Course (Addendum)
Found unresponsive at home, from syncopal event, unwitnessed. No complaints, besides mild abdominal pain. Soft blood pressures, 1L bolus   Has not had any energy, and tired. Lives with son and family. On the toilet making some noise. Never happened before. Has been in pain. Couldn't even use the bathroom, has ben trying to but hasn't been able to have a bowel movement. Last one was two days ago. Had a banana this morning, and a pizza. Doesn't drink water as much. What's the last thing she remembers.  Abdominal pain in naval area. Feels like its contractions. Comes and goes. Does not radiate. NO exacerbating factors or rlieving factors. Does have a history of heart burn. Vomited today before. Still feeling nauseous. Three c sections. Denieas urinary symptoms. Has been feeling cold or hot.   Head surgery??  [facility]:   T2DM February Left and right brain?? HTN   Meds:  Xanax .5mg   Amlodipine 10mg   Aspirin 81mg Lipitor 20mg  Chlorthalidone 25mg   Plavix 75mg   Flexeril 5mg   Lasix 20mg   Doxazosin 1mg   Losartan 100mg   Metformin 500mg  BID  Seroquel 25mg     Has been smkoking 15 years. Does not drink alcohol, never done cocaine, smokes marijuana reccreationally.   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 Presented to the ED on 01/29/2023 via EMS after found on the toilet by her grandson. There where no witness to her syncopal event.  She states she does not know what happened, but heard from from her grandson she was moaning on the toilet and unaware of the events. She denies any history of similar events and denies any seizure history. States last bowel movement was 2-3 days ago and was constipated then as well, hard stool no red or black discoloration.   Patient states she ran out of all of her medications about 4 days ago, during this time she also has had no energy, has been feeling cold, has been constipated and endorses that she has not had an appetite because all food tastes bland. She states he has had on and off pain and pointed to her belly button stated it was 10/10 intensity and localized to the area without radiation.   # Partial Small Bowel Obstruction # Constipation # Liver Lesion (3.4 cm possible hemangioma in posterior left lobe)  Patient presented on 8/24 due to a unwitnessed syncopal episode and periumbilical abdominal pain nausea and an episode of vomiting. She was found to have a partial SBO on CT with multiple fluid filled dilated small bowel with transition zone in right mid abdomen consistent with partial SBO. The patient had a sizable bowel movement on 8/25 evening and has not had any pain since, patient eating breakfast at time of exam on 8/26 denies nausea, vomiting or abdominal pain. Patient agreeable to discharge today. - General surgery saw the patient, appreciate the recommendations, partial sbo without obstruction, patient was initially NPO, held off on NG. - Initially was NPO, advanced to clear liquids and next day to soft diet, advance diet as tolerated. - Tylenol for pain, try to avoid opioids if possible - Initial Creatinine was 1.77>>1.43>>    f/u with BMP today  - Continue with abdominal exams and monitor for a bowel movement. - RUQ Liver U/S to  f/u with lesion seen on CT - Discharge with f/u with PCP Dr. Fatima Sanger  - School note for patients grandson   # Syncopal Episode  Patient initially presented with an unwitnessed syncopal episode. While in the ER the patient was hypotensive and had an episode of bradycardia. EKG showed prolonged PR interval. It appears this was more likely vasovagal syncope in the setting of valsalva/straining, as well as profound dehydration orthostatic likely contributed. Less likely  cardiogenic syncope, however patient was placed on cardiac telemetry and monitored without further events.   - Continue to monitor  - Obtain orthostatic vitals due to patients morning episode of dizziness with positional changes

## 2023-01-29 NOTE — Progress Notes (Signed)
Patient seen at bedside after CT scan showed "multiple fluid-filled dilated loops of small bowel with transition zone in the right mid abdomen consistent with partial small bowel obstruction." Patient endorses moderate periumbilical pain unchanged since admission. No BM in two days; no flatus in 24 hours. Patient eating without issues. Denies n/v or worsening pain.  Moderate periumbilical tenderness on exam with hyperactive bowel sounds. No distension, rigidity/peritoneal signs.  Plan: - Patient placed NPO. Surgery consulted will see patient. Appreciate recommendations - Holding off on NG tube without worsening pain, n/v - Please page [phone] for worsening pain, n/v  -Carmina Miller, D.O.

## 2023-01-30 DIAGNOSIS — R55 Syncope and collapse: Secondary | ICD-10-CM | POA: Diagnosis not present

## 2023-01-30 DIAGNOSIS — R1033 Periumbilical pain: Secondary | ICD-10-CM | POA: Diagnosis not present

## 2023-01-30 DIAGNOSIS — R112 Nausea with vomiting, unspecified: Secondary | ICD-10-CM

## 2023-01-30 DIAGNOSIS — K56600 Partial intestinal obstruction, unspecified as to cause: Secondary | ICD-10-CM

## 2023-01-30 LAB — URINALYSIS, ROUTINE W REFLEX MICROSCOPIC
Bilirubin Urine: NEGATIVE
Glucose, UA: NEGATIVE mg/dL
Hgb urine dipstick: NEGATIVE
Ketones, ur: NEGATIVE mg/dL
Leukocytes,Ua: NEGATIVE
Nitrite: NEGATIVE
Protein, ur: NEGATIVE mg/dL
Specific Gravity, Urine: 1.043 — ABNORMAL HIGH (ref 1.005–1.030)
pH: 5 (ref 5.0–8.0)

## 2023-01-30 LAB — CBC
HCT: 32.6 % — ABNORMAL LOW (ref 36.0–46.0)
Hemoglobin: 11 g/dL — ABNORMAL LOW (ref 12.0–15.0)
MCH: 30.6 pg (ref 26.0–34.0)
MCHC: 33.7 g/dL (ref 30.0–36.0)
MCV: 90.8 fL (ref 80.0–100.0)
Platelets: 228 10*3/uL (ref 150–400)
RBC: 3.59 MIL/uL — ABNORMAL LOW (ref 3.87–5.11)
RDW: 16.1 % — ABNORMAL HIGH (ref 11.5–15.5)
WBC: 7.9 10*3/uL (ref 4.0–10.5)
nRBC: 0 % (ref 0.0–0.2)

## 2023-01-30 LAB — HIV ANTIBODY (ROUTINE TESTING W REFLEX): HIV Screen 4th Generation wRfx: NONREACTIVE

## 2023-01-30 LAB — RAPID URINE DRUG SCREEN, HOSP PERFORMED
Amphetamines: NOT DETECTED
Barbiturates: NOT DETECTED
Benzodiazepines: NOT DETECTED
Cocaine: NOT DETECTED
Opiates: NOT DETECTED
Tetrahydrocannabinol: POSITIVE — AB

## 2023-01-30 LAB — BASIC METABOLIC PANEL
Anion gap: 9 (ref 5–15)
BUN: 12 mg/dL (ref 6–20)
CO2: 21 mmol/L — ABNORMAL LOW (ref 22–32)
Calcium: 8.8 mg/dL — ABNORMAL LOW (ref 8.9–10.3)
Chloride: 104 mmol/L (ref 98–111)
Creatinine, Ser: 1.44 mg/dL — ABNORMAL HIGH (ref 0.44–1.00)
GFR, Estimated: 42 mL/min — ABNORMAL LOW (ref >60)
Glucose, Bld: 81 mg/dL (ref 70–99)
Potassium: 3.5 mmol/L (ref 3.5–5.1)
Sodium: 134 mmol/L — ABNORMAL LOW (ref 135–145)

## 2023-01-30 LAB — GLUCOSE, CAPILLARY
Glucose-Capillary: 92 mg/dL (ref 70–99)
Glucose-Capillary: 206 mg/dL — ABNORMAL HIGH (ref 70–99)
Glucose-Capillary: 233 mg/dL — ABNORMAL HIGH (ref 70–99)
Glucose-Capillary: 137 mg/dL — ABNORMAL HIGH (ref 70–99)

## 2023-01-30 NOTE — Progress Notes (Signed)
Subjective:   Summary: Jordan Harvey is a 57 y.o. year old female currently admitted on the IMTS HD#1 for syncope of unknown etiology.  Overnight Events: NOE  Pt was seen bedside this AM. She is in good spirits, and has no acute complaints. She understands the "blockage" in her stomach, and is hungry. She has not had a bowel movement as of yet, but is passing flatus.   Objective:  Vital signs in last 24 hours: Vitals:   01/29/23 1629 01/29/23 1956 01/30/23 0543 01/30/23 0732  BP: 121/63 (!) 126/101 105/89 118/78  Pulse: 61 70 86 71  Resp: 20 18 16 19   Temp: 97.6 F (36.4 C) 98.1 F (36.7 C) 98.3 F (36.8 C) 98.4 F (36.9 C)  TempSrc: Oral Oral Oral Oral  SpO2: 98% 99% 94% 90%  Weight: 89.9 kg     Height: 5\' 4"  (1.626 m)      Supplemental O2: Room Air    Physical Exam:  Constitutional: Appears older than stated age female, in no acute distress Cardiovascular: RRR, no murmurs, rubs or gallops Pulmonary/Chest: normal work of breathing on room air, lungs clear to auscultation bilaterally Abdominal: soft, non-tender, non-distended.  Normal bowel sounds. Skin: warm and dry Extremities: upper/lower extremity pulses 2+, no lower extremity edema present  [facility] Weights   01/29/23 1136 01/29/23 1629  Weight: 90.7 kg 89.9 kg     Intake/Output Summary (Last 24 hours) at 01/30/2023 0919 Last data filed at 01/30/2023 0300 Gross per 24 hour  Intake 2311.75 ml  Output --  Net 2311.75 ml   Net IO Since Admission: 2,311.75 mL [01/30/23 0919]  Pertinent Labs:    Latest Ref Rng & Units 01/30/2023    6:25 AM 01/29/2023   12:35 PM 05/07/2022    8:01 PM  CBC  WBC 4.0 - 10.5 K/uL 7.9  8.6  9.2   Hemoglobin 12.0 - 15.0 g/dL 84.6  96.2  95.2   Hematocrit 36.0 - 46.0 % 32.6  34.8  39.3   Platelets 150 - 400 K/uL 228  243  342        Latest Ref Rng & Units 01/30/2023    6:25 AM 01/29/2023   12:35 PM 05/07/2022    8:01 PM  CMP  Glucose 70 - 99 mg/dL 81  841   324   BUN 6 - 20 mg/dL 12  14  13    Creatinine 0.44 - 1.00 mg/dL 4.01  0.27  2.53   Sodium 135 - 145 mmol/L 134  135  138   Potassium 3.5 - 5.1 mmol/L 3.5  3.3  3.6   Chloride 98 - 111 mmol/L 104  103  105   CO2 22 - 32 mmol/L 21  21  23    Calcium 8.9 - 10.3 mg/dL 8.8  8.9  9.3   Total Protein 6.5 - 8.1 g/dL  7.3  7.2   Total Bilirubin 0.3 - 1.2 mg/dL  0.9  0.6   Alkaline Phos 38 - 126 U/L  64  85   AST 15 - 41 U/L  45  20   ALT 0 - 44 U/L  41  15      Imaging: CT ABDOMEN PELVIS W CONTRAST  Result Date: 01/29/2023 CLINICAL DATA:  Acute abdominal pain EXAM: CT ABDOMEN AND PELVIS WITH CONTRAST TECHNIQUE: Multidetector CT imaging of the abdomen and pelvis was performed using the standard protocol  following bolus administration of intravenous contrast. RADIATION DOSE REDUCTION: This exam was performed according to the departmental dose-optimization program which includes automated exposure control, adjustment of the mA and/or kV according to patient size and/or use of iterative reconstruction technique. CONTRAST:  60mL OMNIPAQUE IOHEXOL 350 MG/ML SOLN COMPARISON:  09/04/2019 FINDINGS: Lower chest: Mild basilar atelectasis is seen bilaterally. No focal confluent infiltrate is noted. Calcified hilar nodes are seen consistent with prior granulomatous disease. Minimal pericardial effusion is noted. Hepatobiliary: Liver is well visualized. In the posterior aspect of the left lobe of the liver laterally there is a rounded somewhat enhancing area with central decreased attenuation which measures approximately 3.4 cm. Statistically this likely represents a hemangioma although ultrasound may be helpful for further evaluation. Gallbladder is well distended. Mild wall thickening is noted. No pericholecystic fluid is noted. Pancreas: Unremarkable. No pancreatic ductal dilatation or surrounding inflammatory changes. Spleen: Normal in size without focal abnormality. Adrenals/Urinary Tract: Left adrenal gland is  within normal limits. Stable right adrenal adenoma is noted measuring up to 4.4 cm. The kidneys demonstrate a normal enhancement pattern bilaterally. No renal calculi or obstructive changes are seen. The bladder is partially distended. Stomach/Bowel: Appendix is well visualized and within normal limits. No obstructive or inflammatory changes of the colon are seen. Stomach is well distended with fluid. Multiple fluid-filled dilated loops of small bowel are identified in the mid abdomen involving the distal jejunum and proximal ileum. Throughout the course of these dilated loops are multiple areas of transition likely related to peristalsis. Distally however there is a focal transition zone best seen on image number 63 of series 3 and image number 102 of series 6. These changes are consistent with partial small bowel obstruction. The more distal ileum appears within normal limits. Vascular/Lymphatic: Aortic atherosclerosis. No enlarged abdominal or pelvic lymph nodes. Left iliac arterial stent is in place and patent. Reproductive: Uterus and bilateral adnexa are unremarkable. Other: No abdominal wall hernia or abnormality. No abdominopelvic ascites. Musculoskeletal: Degenerative changes of lumbar spine are noted. IMPRESSION: Multiple fluid-filled dilated loops of small bowel with transition zone in the right mid abdomen consistent with partial small bowel obstruction. New partially enhancing lesion in the posterior aspect of the left lobe of the liver as described. Ultrasound would be helpful for further evaluation. Stable right adrenal adenoma.  No further follow-up is recommended. Well distended gallbladder with mild wall thickening. This is of uncertain significance. This could also be evaluated with ultrasound. Electronically Signed   By: Alcide Clever M.D.   On: 01/29/2023 19:18   CT Head Wo Contrast  Result Date: 01/29/2023 CLINICAL DATA:  Head trauma, moderate to severe. Unexplained loss of consciousness.  EXAM: CT HEAD WITHOUT CONTRAST TECHNIQUE: Contiguous axial images were obtained from the base of the skull through the vertex without intravenous contrast. RADIATION DOSE REDUCTION: This exam was performed according to the departmental dose-optimization program which includes automated exposure control, adjustment of the mA and/or kV according to patient size and/or use of iterative reconstruction technique. COMPARISON:  05/07/2022 FINDINGS: Brain: Scattered intraventricular calcifications without hydrocephalus or change. Mild low-density in the right frontal white matter, likely from old ventriculostomy. Accounting for streak artifact there is no convincing infarct. No hemorrhage, hydrocephalus, or collection. Vascular: No hyperdense vessel or unexpected calcification. Aneurysm coil mass along the left circle-of-Willis. Skull: Negative for fracture or bone lesion. Sinuses/Orbits: Right enucleation with prosthesis. Left cataract resection. IMPRESSION: No acute or interval finding. Electronically Signed   By: Audry Riles.D.  On: 01/29/2023 12:37     Assessment/Plan:   Principal Problem:   Syncope and collapse   Patient Summary: Jordan Harvey is a 57 y.o. with a pertinent PMH of CAD, DM, DVT on eliquis, subarachnoid brain aneurysm s/p coil placement, thyroid cancer, HTN, who presented with syncope and admitted for syncope workup.    #Partial SBO Overnight, CT results revealed a partial SBO in the in the right mid abdomen with partial small bowel obstruction. Overall pt is doing well, and has an appetite. She is passing flatus, but has not yet had a bowel movement. Currently denies any abdominal pain. Surgery saw patient overnight, and no surgical intervention is currently required, will also hold off on NG tube placement. Will also try to advance diet today as well to soft.   Plan:  - Appreciate surgery recommendations and input  - Monitor for bowel movement  - Advance diet as tolerated  (currently soft)    #Syncopal Episode Current testing has been negative for arryhthmia, ACS, and neurogenic. Electrolytes also have been within normal limits. This seems more consistent with vasovagal response to when pt was straining on the bathroom. Will keep patient on tele however, as she does have a prolonged PR interval. Of note, drug screen was positive for THC, which could be playing a role. She was very volume depleted and is s/p 2L of LR. Creatinine has improved from 1.77 to 1.43, consistent with patient's baseline.   Plan:  - Continue to monitor  #History of DVT  #Hx of Subarachnoid aneurysm s/p coil Stable, continuing eliquis  Chronic Conditions # HLD- home atorvastatin # Osteoarthritis- home gabapentin, and mobic  Diet: Soft IVF: None,None VTE: NOAC Code: Full PT/OT recs: Pending,     Dispo: Anticipated discharge to Home in 2 days pending medical management.   Laretta Bolster, M.D.   Internal Medicine Resident, PGY-1 [facility]  Pager: [phone] 9:47 AM, 01/30/2023   **Please contact the on call pager after 5 pm and on weekends at [phone].**

## 2023-01-30 NOTE — Consult Note (Signed)
Reason for Consult:SBO Referring Physician: Peter Congo Jordan Harvey is an 57 y.o. female.  HPI:  Patient presented to the emergency department following syncope on the toilet on 01/29/2023.  Jordan Harvey has extensive past medical history with coronary artery disease, diabetes, and previous history of DVT on Eliquis, prior subarachnoid aneurysm with coil placement, hypertension, hyperlipidemia.  Jordan Harvey has had some constipation with no bowel movement for a few days.  Once Jordan Harvey was more alert, Jordan Harvey complained of abdominal pain and CT was performed that was concerning for possible SBO.  At the point of my evaluation, Jordan Harvey had no pain and was having a bit of flatus.  No nausea/vomiting.  Jordan Harvey also says Jordan Harvey is having "the best sleep (Jordan Harvey) has had in days."    H/o tubal ligation and c section  Past Medical History:  Diagnosis Date   Anxiety    CAD (coronary artery disease)    Cancer of thyroid (HCC)    CKD (chronic kidney disease), stage III (HCC)    COPD (chronic obstructive pulmonary disease) (HCC)    Diabetes mellitus without complication (HCC)    DVT (deep venous thrombosis) (HCC)    Emphysema lung (HCC)    GERD (gastroesophageal reflux disease)    Heart failure (HCC)    CHF   Hyperlipidemia    Hypertension    Insomnia    MDD (major depressive disorder)    Osteoarthritis    PAD (peripheral artery disease) (HCC)    Skin cancer    cheek, squamous cell   Subarachnoid hemorrhage (HCC)    Thyroid cancer (HCC)    Thyroid disease    hypo   TIA (transient ischemic attack)    Vitreous floaters of left eye     Past Surgical History:  Procedure Laterality Date   CARDIAC CATHETERIZATION N/A 01/09/2016   Procedure: Left Heart Cath and Coronary Angiography;  Surgeon: Lyn Records, MD;  Location: [facility] INVASIVE CV LAB;  Service: Cardiovascular;  Laterality: N/A;   CESAREAN SECTION     EYE SURGERY     INSERTION OF ILIAC STENT Left 01/09/2016   Procedure: Left Femoral Artery Exposure with INSERTION OF  LEFT ILIAC STENT;  Surgeon: Nada Libman, MD;  Location: [facility] OR;  Service: Vascular;  Laterality: Left;   THYROID SURGERY  2008   Not sure what they did   TUBAL LIGATION  1991    Family History  Problem Relation Age of Onset   Breast cancer Mother    CAD Sister 26       MI    Social History:  reports that Jordan Harvey has been smoking cigarettes. Jordan Harvey has never used smokeless tobacco. Jordan Harvey reports current drug use. Frequency: 1.00 time per week. Drug: Marijuana. Jordan Harvey reports that Jordan Harvey does not drink alcohol.  Allergies:  Allergies  Allergen Reactions   Bee Venom Shortness Of Breath    Medications:  Current Meds  Medication Sig   acetaminophen (TYLENOL) 325 MG tablet Take 2 tablets (650 mg total) by mouth every 6 (six) hours as needed.   amLODipine (NORVASC) 10 MG tablet Take 10 mg by mouth daily.   aspirin EC 81 MG EC tablet Take 1 tablet (81 mg total) by mouth daily.   atorvastatin (LIPITOR) 20 MG tablet Take 2 tablets (40 mg total) by mouth daily. Reported on 12/11/2015   carvedilol (COREG) 6.25 MG tablet Take 6.25 mg by mouth 2 (two) times daily with a meal.   doxazosin (CARDURA) 1 MG tablet Take 1 mg by  mouth 2 (two) times daily.   ELIQUIS 5 MG TABS tablet Take 5 mg by mouth 2 (two) times daily.   gabapentin (NEURONTIN) 300 MG capsule Take 300 mg by mouth 3 (three) times daily.   hydrochlorothiazide (HYDRODIURIL) 25 MG tablet Take 25 mg by mouth daily.   hydrOXYzine (ATARAX) 50 MG tablet Take 50 mg by mouth 3 (three) times daily.   losartan (COZAAR) 100 MG tablet Take 1 tablet (100 mg total) by mouth daily.   meloxicam (MOBIC) 7.5 MG tablet Take 7.5 mg by mouth daily.   montelukast (SINGULAIR) 10 MG tablet Take 10 mg by mouth at bedtime.   oxybutynin (DITROPAN-XL) 10 MG 24 hr tablet Take 10 mg by mouth daily.   pantoprazole (PROTONIX) 40 MG tablet Take by mouth daily.   venlafaxine XR (EFFEXOR-XR) 37.5 MG 24 hr capsule Take 37.5 mg by mouth daily.     Results for orders placed or  performed during the hospital encounter of 01/29/23 (from the past 48 hour(s))  CBG monitoring, ED     Status: Abnormal   Collection Time: 01/29/23 11:38 AM  Result Value Ref Range   Glucose-Capillary 106 (H) 70 - 99 mg/dL    Comment: Glucose reference range applies only to samples taken after fasting for at least 8 hours.  CBC     Status: Abnormal   Collection Time: 01/29/23 12:35 PM  Result Value Ref Range   WBC 8.6 4.0 - 10.5 K/uL   RBC 3.86 (L) 3.87 - 5.11 MIL/uL   Hemoglobin 11.8 (L) 12.0 - 15.0 g/dL   HCT 11.9 (L) 14.7 - 82.9 %   MCV 90.2 80.0 - 100.0 fL   MCH 30.6 26.0 - 34.0 pg   MCHC 33.9 30.0 - 36.0 g/dL   RDW 56.2 (H) 13.0 - 86.5 %   Platelets 243 150 - 400 K/uL   nRBC 0.0 0.0 - 0.2 %    Comment: Performed at [facility], [address] [place], [place] [address]  Resp panel by RT-PCR (RSV, Flu A&B, Covid) Anterior Nasal Swab     Status: None   Collection Time: 01/29/23 12:35 PM   Specimen: Anterior Nasal Swab  Result Value Ref Range   SARS Coronavirus 2 by RT PCR NEGATIVE NEGATIVE   Influenza A by PCR NEGATIVE NEGATIVE   Influenza B by PCR NEGATIVE NEGATIVE    Comment: (NOTE) The Xpert Xpress SARS-CoV-2/FLU/RSV plus assay is intended as an aid in the diagnosis of influenza from Nasopharyngeal swab specimens and should not be used as a sole basis for treatment. Nasal washings and aspirates are unacceptable for Xpert Xpress SARS-CoV-2/FLU/RSV testing.  Fact Sheet for Patients: BloggerCourse.com  Fact Sheet for Healthcare Providers: SeriousBroker.it  This test is not yet approved or cleared by the [place] FDA and has been authorized for detection and/or diagnosis of SARS-CoV-2 by FDA under an Emergency Use Authorization (EUA). This EUA will remain in effect (meaning this test can be used) for the duration of the COVID-19 declaration under Section 564(b)(1) of the Act, 21 U.S.C. section 360bbb-3(b)(1),  unless the authorization is terminated or revoked.     Resp Syncytial Virus by PCR NEGATIVE NEGATIVE    Comment: (NOTE) Fact Sheet for Patients: BloggerCourse.com  Fact Sheet for Healthcare Providers: SeriousBroker.it  This test is not yet approved or cleared by the [place] FDA and has been authorized for detection and/or diagnosis of SARS-CoV-2 by FDA under an Emergency Use Authorization (EUA). This EUA will remain in  effect (meaning this test can be used) for the duration of the COVID-19 declaration under Section 564(b)(1) of the Act, 21 U.S.C. section 360bbb-3(b)(1), unless the authorization is terminated or revoked.  Performed at [facility], [address] [place], [place] [address]   Comprehensive metabolic panel     Status: Abnormal   Collection Time: 01/29/23 12:35 PM  Result Value Ref Range   Sodium 135 135 - 145 mmol/L   Potassium 3.3 (L) 3.5 - 5.1 mmol/L   Chloride 103 98 - 111 mmol/L   CO2 21 (L) 22 - 32 mmol/L   Glucose, Bld 105 (H) 70 - 99 mg/dL    Comment: Glucose reference range applies only to samples taken after fasting for at least 8 hours.   BUN 14 6 - 20 mg/dL   Creatinine, Ser 9.14 (H) 0.44 - 1.00 mg/dL   Calcium 8.9 8.9 - 78.2 mg/dL   Total Protein 7.3 6.5 - 8.1 g/dL   Albumin 4.1 3.5 - 5.0 g/dL   AST 45 (H) 15 - 41 U/L   ALT 41 0 - 44 U/L   Alkaline Phosphatase 64 38 - 126 U/L   Total Bilirubin 0.9 0.3 - 1.2 mg/dL   GFR, Estimated 35 (L) >60 mL/min    Comment: (NOTE) Calculated using the CKD-EPI Creatinine Equation (2021)    Anion gap 11 5 - 15    Comment: Performed at [facility], [address], [place], [place] [address]  Lipase, blood     Status: None   Collection Time: 01/29/23 12:35 PM  Result Value Ref Range   Lipase 32 11 - 51 U/L    Comment: Performed at [facility], [address], [place], [place] [address]  Glucose, capillary     Status: Abnormal    Collection Time: 01/29/23  5:04 PM  Result Value Ref Range   Glucose-Capillary 110 (H) 70 - 99 mg/dL    Comment: Glucose reference range applies only to samples taken after fasting for at least 8 hours.   Comment 1 Document in Chart   Glucose, capillary     Status: Abnormal   Collection Time: 01/29/23  7:57 PM  Result Value Ref Range   Glucose-Capillary 109 (H) 70 - 99 mg/dL    Comment: Glucose reference range applies only to samples taken after fasting for at least 8 hours.    CT ABDOMEN PELVIS W CONTRAST  Result Date: 01/29/2023 CLINICAL DATA:  Acute abdominal pain EXAM: CT ABDOMEN AND PELVIS WITH CONTRAST TECHNIQUE: Multidetector CT imaging of the abdomen and pelvis was performed using the standard protocol following bolus administration of intravenous contrast. RADIATION DOSE REDUCTION: This exam was performed according to the departmental dose-optimization program which includes automated exposure control, adjustment of the mA and/or kV according to patient size and/or use of iterative reconstruction technique. CONTRAST:  60mL OMNIPAQUE IOHEXOL 350 MG/ML SOLN COMPARISON:  09/04/2019 FINDINGS: Lower chest: Mild basilar atelectasis is seen bilaterally. No focal confluent infiltrate is noted. Calcified hilar nodes are seen consistent with prior granulomatous disease. Minimal pericardial effusion is noted. Hepatobiliary: Liver is well visualized. In the posterior aspect of the left lobe of the liver laterally there is a rounded somewhat enhancing area with central decreased attenuation which measures approximately 3.4 cm. Statistically this likely represents a hemangioma although ultrasound may be helpful for further evaluation. Gallbladder is well distended. Mild wall thickening is noted. No pericholecystic fluid is noted. Pancreas: Unremarkable. No pancreatic ductal dilatation or surrounding  inflammatory changes. Spleen: Normal in size without focal abnormality. Adrenals/Urinary Tract: Left adrenal  gland is within normal limits. Stable right adrenal adenoma is noted measuring up to 4.4 cm. The kidneys demonstrate a normal enhancement pattern bilaterally. No renal calculi or obstructive changes are seen. The bladder is partially distended. Stomach/Bowel: Appendix is well visualized and within normal limits. No obstructive or inflammatory changes of the colon are seen. Stomach is well distended with fluid. Multiple fluid-filled dilated loops of small bowel are identified in the mid abdomen involving the distal jejunum and proximal ileum. Throughout the course of these dilated loops are multiple areas of transition likely related to peristalsis. Distally however there is a focal transition zone best seen on image number 63 of series 3 and image number 102 of series 6. These changes are consistent with partial small bowel obstruction. The more distal ileum appears within normal limits. Vascular/Lymphatic: Aortic atherosclerosis. No enlarged abdominal or pelvic lymph nodes. Left iliac arterial stent is in place and patent. Reproductive: Uterus and bilateral adnexa are unremarkable. Other: No abdominal wall hernia or abnormality. No abdominopelvic ascites. Musculoskeletal: Degenerative changes of lumbar spine are noted. IMPRESSION: Multiple fluid-filled dilated loops of small bowel with transition zone in the right mid abdomen consistent with partial small bowel obstruction. New partially enhancing lesion in the posterior aspect of the left lobe of the liver as described. Ultrasound would be helpful for further evaluation. Stable right adrenal adenoma.  No further follow-up is recommended. Well distended gallbladder with mild wall thickening. This is of uncertain significance. This could also be evaluated with ultrasound. Electronically Signed   By: Alcide Clever M.D.   On: 01/29/2023 19:18   CT Head Wo Contrast  Result Date: 01/29/2023 CLINICAL DATA:  Head trauma, moderate to severe. Unexplained loss of  consciousness. EXAM: CT HEAD WITHOUT CONTRAST TECHNIQUE: Contiguous axial images were obtained from the base of the skull through the vertex without intravenous contrast. RADIATION DOSE REDUCTION: This exam was performed according to the departmental dose-optimization program which includes automated exposure control, adjustment of the mA and/or kV according to patient size and/or use of iterative reconstruction technique. COMPARISON:  05/07/2022 FINDINGS: Brain: Scattered intraventricular calcifications without hydrocephalus or change. Mild low-density in the right frontal white matter, likely from old ventriculostomy. Accounting for streak artifact there is no convincing infarct. No hemorrhage, hydrocephalus, or collection. Vascular: No hyperdense vessel or unexpected calcification. Aneurysm coil mass along the left circle-of-Willis. Skull: Negative for fracture or bone lesion. Sinuses/Orbits: Right enucleation with prosthesis. Left cataract resection. IMPRESSION: No acute or interval finding. Electronically Signed   By: Tiburcio Pea M.D.   On: 01/29/2023 12:37    Review of Systems  All other systems reviewed and are negative.  Blood pressure (!) 126/101, pulse 70, temperature 98.1 F (36.7 C), temperature source Oral, resp. rate 18, height 5\' 4"  (1.626 m), weight 89.9 kg, last menstrual period 03/21/2014, SpO2 99%. Physical Exam Vitals reviewed.  Constitutional:      General: Jordan Harvey is not in acute distress.    Appearance: Jordan Harvey is not ill-appearing or toxic-appearing.     Comments: Sleeping, but awakens easily  HENT:     Head: Normocephalic and atraumatic.  Cardiovascular:     Rate and Rhythm: Normal rate and regular rhythm.     Pulses: Normal pulses.     Heart sounds: Normal heart sounds. No murmur heard. Pulmonary:     Effort: Pulmonary effort is normal. No respiratory distress.     Breath sounds: Normal breath  sounds. No stridor.  Abdominal:     General: Bowel sounds are normal. There is  no distension.     Palpations: Abdomen is soft.     Tenderness: There is no abdominal tenderness. There is no guarding or rebound.     Hernia: No hernia is present.  Skin:    General: Skin is warm and dry.     Capillary Refill: Capillary refill takes 2 to 3 seconds.     Coloration: Skin is not jaundiced.  Neurological:     General: No focal deficit present.     Mental Status: Jordan Harvey is oriented to person, place, and time.  Psychiatric:        Mood and Affect: Mood normal.        Behavior: Behavior normal.        Thought Content: Thought content normal.     Assessment/Plan: Constipation Possible early SBO, but certainly no evidence of obstruction now.   No acute surgical intervention required.  Can advance diet as tolerated.   Almond Lint 01/30/2023, 1:56 AM

## 2023-01-30 NOTE — Evaluation (Signed)
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  Clinical Impression   Pt admitted with above diagnosis. Lives at home with her grandson, in a single-level apartment home with 17 steps to enter; Prior to admission, pt was able to manage independently; Presents to PT with generalized wekness and mildly decr activity tolerance; contact guard assist or supervision for much of her mobility; walked the hallway with supervision and occasional use of hallway rail; Anticipate good progress and no need for PT follow up;  Pt currently with functional limitations due to the deficits listed below (see PT Problem List). Pt will benefit from skilled PT to increase their independence and safety with mobility to allow discharge to the venue listed below.           If plan is discharge home, recommend the following:     Can travel by private vehicle        Equipment Recommendations None recommended by PT  Recommendations for Other Services       Functional Status Assessment Patient has had a recent decline in their functional status and demonstrates the ability to make significant improvements in function in a reasonable and predictable amount of time.     Precautions / Restrictions Precautions Precautions: None Restrictions Weight Bearing Restrictions: No      Mobility  Bed Mobility Overal bed mobility: Modified Independent             General bed mobility comments: incr time    Transfers Overall transfer level: Needs  assistance Equipment used: None Transfers: Sit to/from Stand Sit to Stand: Supervision           General transfer comment: Cues to self-monitor for activity tolerance    Ambulation/Gait Ambulation/Gait assistance: Supervision Gait Distance (Feet): 160 Feet Assistive device:  (hallway rail) Gait Pattern/deviations: Step-through pattern       General Gait Details: Mildly unsteady, and used hallway rail as needed  Stairs            Wheelchair Mobility     Tilt Bed    Modified Rankin (Stroke Patients Only)       Balance Overall balance assessment: Mild deficits observed, not formally tested                                           Pertinent Vitals/Pain Pain Assessment Pain Assessment: No/denies pain    Home Living Family/patient expects to be discharged to:: Private residence Living Arrangements: Other relatives Available Help at Discharge: Family Type of Home: Apartment Home Access: Stairs to enter Entrance Stairs-Rails: Right Entrance Stairs-Number of Steps: 17   Home Layout: One level Home Equipment: None      Prior Function Prior Level of Function : Independent/Modified Independent                     Extremity/Trunk Assessment   Upper Extremity Assessment Upper Extremity Assessment: Defer to OT evaluation    Lower Extremity  Assessment Lower Extremity Assessment: Generalized weakness (mild weakness)       Communication   Communication Communication: No apparent difficulties  Cognition Arousal: Alert Behavior During Therapy: WFL for tasks assessed/performed Overall Cognitive Status: Within Functional Limits for tasks assessed                                          General Comments General comments (skin integrity, edema, etc.): Orthostatic vitals obtained and normal; see vitals flowsheets for details    Exercises     Assessment/Plan    PT Assessment Patient needs continued PT  services  PT Problem List Decreased strength;Decreased activity tolerance;Decreased balance;Decreased knowledge of use of DME       PT Treatment Interventions DME instruction;Gait training;Stair training;Functional mobility training;Therapeutic activities;Therapeutic exercise;Balance training;Patient/family education    PT Goals (Current goals can be found in the Care Plan section)  Acute Rehab PT Goals Patient Stated Goal: home soon PT Goal Formulation: With patient Time For Goal Achievement: 02/13/23 Potential to Achieve Goals: Good    Frequency Min 1X/week     Co-evaluation               AM-PAC PT "6 Clicks" Mobility  Outcome Measure Help needed turning from your back to your side while in a flat bed without using bedrails?: None Help needed moving from lying on your back to sitting on the side of a flat bed without using bedrails?: None Help needed moving to and from a bed to a chair (including a wheelchair)?: A Little Help needed standing up from a chair using your arms (e.g., wheelchair or bedside chair)?: A Little Help needed to walk in hospital room?: A Little Help needed climbing 3-5 steps with a railing? : A Little 6 Click Score: 20    End of Session Equipment Utilized During Treatment: Gait belt Activity Tolerance: Patient tolerated treatment well Patient left: in bed;with call bell/phone within reach (sitting EOB for lunch) Nurse Communication: Mobility status PT Visit Diagnosis: Unsteadiness on feet (R26.81)    Time: 6962-9528 PT Time Calculation (min) (ACUTE ONLY): 27 min   Charges:   PT Evaluation $PT Eval Low Complexity: 1 Low PT Treatments $Gait Training: 8-22 mins PT General Charges $$ ACUTE PT VISIT: 1 Visit         Van Clines, PT  Acute Rehabilitation Services Office [phone] Secure Chat welcomed   Levi Aland 01/30/2023, 4:34 PM

## 2023-01-30 NOTE — Plan of Care (Signed)

## 2023-01-31 ENCOUNTER — Inpatient Hospital Stay (HOSPITAL_COMMUNITY): Payer: 59

## 2023-01-31 DIAGNOSIS — Z85850 Personal history of malignant neoplasm of thyroid: Secondary | ICD-10-CM

## 2023-01-31 DIAGNOSIS — R55 Syncope and collapse: Secondary | ICD-10-CM | POA: Diagnosis not present

## 2023-01-31 LAB — GLUCOSE, CAPILLARY
Glucose-Capillary: 95 mg/dL (ref 70–99)
Glucose-Capillary: 126 mg/dL — ABNORMAL HIGH (ref 70–99)

## 2023-01-31 LAB — BASIC METABOLIC PANEL
Anion gap: 14 (ref 5–15)
BUN: 15 mg/dL (ref 6–20)
CO2: 20 mmol/L — ABNORMAL LOW (ref 22–32)
Calcium: 9.2 mg/dL (ref 8.9–10.3)
Chloride: 102 mmol/L (ref 98–111)
Creatinine, Ser: 1.66 mg/dL — ABNORMAL HIGH (ref 0.44–1.00)
GFR, Estimated: 36 mL/min — ABNORMAL LOW (ref >60)
Glucose, Bld: 76 mg/dL (ref 70–99)
Potassium: 3.7 mmol/L (ref 3.5–5.1)
Sodium: 136 mmol/L (ref 135–145)

## 2023-01-31 LAB — T4, FREE: Free T4: <0.25 ng/dL — ABNORMAL LOW (ref 0.61–1.12)

## 2023-01-31 LAB — TSH: TSH: 126.166 u[IU]/mL — ABNORMAL HIGH (ref 0.350–4.500)

## 2023-01-31 LAB — CORTISOL-AM, BLOOD: Cortisol - AM: 12.5 ug/dL (ref 6.7–22.6)

## 2023-01-31 MED ORDER — POLYETHYLENE GLYCOL 3350 17 G PO PACK: 17.0000 g | PACK | Freq: Every day | ORAL | 0 refills | Status: AC

## 2023-01-31 MED ORDER — LEVOTHYROXINE SODIUM 150 MCG PO TABS: 150.0000 ug | ORAL_TABLET | Freq: Every day | ORAL | 0 refills | Status: AC

## 2023-01-31 NOTE — Discharge Summary (Addendum)
Name: Jordan Harvey MRN: [id number] DOB: 10/01/1965 57 y.o. PCP: Raymon Mutton., FNP  Date of Admission: 01/29/2023 11:28 AM Date of Discharge: 01/31/2023 Attending Physician: Gust Rung, DO  Discharge Diagnosis: 1. Principal Problem:   Syncope and collapse Active Problems:   Type 2 diabetes mellitus with peripheral vascular disease (HCC)   Post-operative hypothyroidism   Partial small bowel obstruction (HCC)   History of papillary adenocarcinoma of thyroid  Patient was initially admitted due to an unwitnessed syncopal episode while trying to have a bowel movement at home, was found to have a partial bowel obstruction on CT scan. Patient was found to be hypothyroid as well   Discharge Medications: Allergies as of 01/31/2023       Reactions   Bee Venom Shortness Of Breath        Medication List     STOP taking these medications    amLODipine 10 MG tablet Commonly known as: NORVASC   doxazosin 1 MG tablet Commonly known as: CARDURA   hydrochlorothiazide 25 MG tablet Commonly known as: HYDRODIURIL   losartan 100 MG tablet Commonly known as: COZAAR       TAKE these medications    acetaminophen 325 MG tablet Commonly known as: Tylenol Take 2 tablets (650 mg total) by mouth every 6 (six) hours as needed.   aspirin EC 81 MG tablet Take 1 tablet (81 mg total) by mouth daily.   atorvastatin 20 MG tablet Commonly known as: LIPITOR Take 2 tablets (40 mg total) by mouth daily. Reported on 12/11/2015   carvedilol 6.25 MG tablet Commonly known as: COREG Take 6.25 mg by mouth 2 (two) times daily with a meal.   Eliquis 5 MG Tabs tablet Generic drug: apixaban Take 5 mg by mouth 2 (two) times daily.   gabapentin 300 MG capsule Commonly known as: NEURONTIN Take 300 mg by mouth 3 (three) times daily.   hydrOXYzine 50 MG tablet Commonly known as: ATARAX Take 50 mg by mouth 3 (three) times daily.   levothyroxine 150 MCG tablet Commonly known as:  Synthroid Take 1 tablet (150 mcg total) by mouth daily.   meloxicam 7.5 MG tablet Commonly known as: MOBIC Take 7.5 mg by mouth daily.   montelukast 10 MG tablet Commonly known as: SINGULAIR Take 10 mg by mouth at bedtime.   oxybutynin 10 MG 24 hr tablet Commonly known as: DITROPAN-XL Take 10 mg by mouth daily.   pantoprazole 40 MG tablet Commonly known as: PROTONIX Take by mouth daily.   polyethylene glycol 17 g packet Commonly known as: MIRALAX / GLYCOLAX Take 17 g by mouth daily.   venlafaxine XR 37.5 MG 24 hr capsule Commonly known as: EFFEXOR-XR Take 37.5 mg by mouth daily.        Current Facility-Administered Medications:    acetaminophen (TYLENOL) tablet 1,000 mg, 1,000 mg, Oral, Q6H, Koomson, Julius, MD, 1,000 mg at 01/31/23 1238   aspirin EC tablet 81 mg, 81 mg, Oral, Daily, Koomson, Julius, MD, 81 mg at 01/31/23 1008   atorvastatin (LIPITOR) tablet 40 mg, 40 mg, Oral, Daily, Koomson, Julius, MD, 40 mg at 01/31/23 1008   enoxaparin (LOVENOX) injection 40 mg, 40 mg, Subcutaneous, Q24H, Rocky Morel, DO, 40 mg at 01/30/23 2115   gabapentin (NEURONTIN) capsule 300 mg, 300 mg, Oral, TID, Laretta Bolster, MD, 300 mg at 01/31/23 1009   hydrOXYzine (ATARAX) tablet 50 mg, 50 mg, Oral, TID, Laretta Bolster, MD, 50 mg at 01/31/23 1009   meloxicam (MOBIC) tablet  7.5 mg, 7.5 mg, Oral, Daily, Koomson, Julius, MD, 7.5 mg at 01/31/23 1008   ondansetron (ZOFRAN) tablet 4 mg, 4 mg, Oral, Q8H PRN, Laretta Bolster, MD, 4 mg at 01/29/23 1642   oxybutynin (DITROPAN-XL) 24 hr tablet 10 mg, 10 mg, Oral, Daily, Koomson, Julius, MD, 10 mg at 01/31/23 1008   polyethylene glycol (MIRALAX / GLYCOLAX) packet 17 g, 17 g, Oral, Daily, Nooruddin, Saad, MD, 17 g at 01/31/23 1009   senna (SENOKOT) tablet 8.6 mg, 1 tablet, Oral, Daily, Nooruddin, Saad, MD, 8.6 mg at 01/31/23 1010   Disposition and follow-up:   Ms.Jordan Harvey was discharged from [facility] in Good  condition.  At the hospital follow up visit please address:  1.  PCP/Dr. Fatima Sanger with blood pressure medication management, thyroid medication management (patient restarted on 150, last refill on 03/02/2022 with a  5mo supply), monitor liver lesion documented on CT.  2.  Labs / imaging needed at time of follow-up: TSH, T4, liver labs  3.  Pending labs/ test needing follow-up: RUQ [place],   Follow-up Appointments: PCP   Hospital Course by problem list:  # Partial Small Bowel Obstruction # Constipation # Liver Lesion (3.4 cm possible hemangioma in posterior left lobe) Patient presented on 8/24 due to a unwitnessed syncopal episode and periumbilical abdominal pain nausea and an episode of vomiting. She was found to have a partial SBO on CT with multiple fluid filled dilated small bowel with transition zone in right mid abdomen consistent with partial SBO. The patient had a sizable bowel movement on 8/25 evening and has not had any pain since, patient eating breakfast at time of exam on 8/26 denies nausea, vomiting or abdominal pain. Patient agreeable to discharge today. - General surgery saw the patient, appreciate the recommendations, partial sbo without obstruction, patient was initially NPO, held off on NG. - Initially was NPO, advanced to clear liquids and next day to soft diet, advance diet as tolerated. - Tylenol for pain, try to avoid opioids if possible - Initial Creatinine was 1.77>>1.43>> 1.66 - BMP obtained this am     - Morning cortisol was wnl,  - Continue with abdominal exams and monitor for a bowel movement. - RUQ Liver U/S to f/u with lesion seen on CT - Discharge with f/u with PCP Dr. Fatima Sanger  - School note for patients grandson   # Syncopal Episode  Patient initially presented with an unwitnessed syncopal episode. While in the ER the patient was hypotensive and had an episode of bradycardia. EKG showed prolonged PR interval. It appears this was more likely vasovagal  syncope in the setting of valsalva/straining, as well as profound dehydration orthostatic likely contributed. Less likely cardiogenic syncope, however patient was placed on cardiac telemetry and monitored without further events.   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 Lazarus Salines.   - Obtain TSH and free T4 - Tsh found to be 126.166 T4<0.25 Last time synthroid filled was 03/02/2022 with a 5mo supply unsure why it  was discontinued/stopped - Restarting patient on 150 mcg with follow up to PCP.   # HTN  # CAD Patient has a history of HTN and CAD and was previously on amlodipine, carvedilol, Doxazosin, Hydrochlorothiazide, and Losartan. All BP lower agents were held during the patients admission as she was found to be hypotensive.  - Upon discharge plan to hold BP meds until follow up with PCP   Discharge Subjective-  Patient seen at bedside and evaluated this morning, she endorsed that she is feeling much better, denied any pain. She states she had a sizable bowel movement last night and she has been able to tolerate eating without any abdominal pain, nausea, or vomiting. She was able to walk around her room however she states she got dizzy when she stood initially will obtain orthostatics to evaluate for this. She states she had an appt with her PCP today at noon and was going to call the office and reschedule that, she requested a school note for her 41 year old grandson.  Discharge Exam:   BP 106/80 (BP Location:  Left Arm)   Pulse 65   Temp 98.4 F (36.9 C) (Oral)   Resp 18   Ht 5\' 4"  (1.626 m)   Wt 89.9 kg   LMP 03/21/2014   SpO2 92%   BMI 34.02 kg/m  Discharge exam:  Physical Exam Vitals and nursing note reviewed.  Constitutional:      General: She is not in acute distress.    Appearance: Normal appearance. She is obese. She is not ill-appearing.  Cardiovascular:     Rate and Rhythm: Normal rate and regular rhythm.     Pulses: Normal pulses.     Heart sounds: Normal heart sounds. No murmur heard.    No friction rub. No gallop.  Pulmonary:     Effort: Pulmonary effort is normal.     Breath sounds: Normal breath sounds.  Abdominal:     General: Abdomen is flat. Bowel sounds are increased.     Palpations: Abdomen is soft.     Tenderness: There is no guarding or rebound.  Neurological:     General: No focal deficit present.     Mental Status: She is alert and oriented to person, place, and time. Mental status is at baseline.  Psychiatric:        Mood and Affect: Mood normal.        Behavior: Behavior normal.        Thought Content: Thought content normal.      Pertinent Labs, Studies, and Procedures:      Latest Ref Rng & Units 01/31/2023    7:00 AM 01/30/2023    6:25 AM 01/29/2023   12:35 PM  CMP  Glucose 70 - 99 mg/dL 76  81  308   BUN 6 - 20 mg/dL 15  12  14    Creatinine 0.44 - 1.00 mg/dL 6.57  8.46  9.62   Sodium 135 - 145 mmol/L 136  134  135   Potassium 3.5 - 5.1 mmol/L 3.7  3.5  3.3   Chloride 98 - 111 mmol/L 102  104  103   CO2 22 - 32 mmol/L 20  21  21    Calcium 8.9 - 10.3 mg/dL 9.2  8.8  8.9   Total Protein 6.5 - 8.1 g/dL   7.3   Total Bilirubin 0.3 - 1.2 mg/dL   0.9   Alkaline Phos 38 - 126 U/L   64   AST 15 -  41 U/L   45   ALT 0 - 44 U/L   41        Latest Ref Rng & Units 01/30/2023    6:25 AM 01/29/2023   12:35 PM 05/07/2022    8:01 PM  CBC  WBC 4.0 - 10.5 K/uL 7.9  8.6  9.2   Hemoglobin 12.0 - 15.0 g/dL 40.9  81.1  91.4   Hematocrit 36.0 - 46.0 % 32.6   34.8  39.3   Platelets 150 - 400 K/uL 228  243  342        Latest Ref Rng & Units 01/31/2023    7:00 AM 01/30/2023    6:25 AM 01/29/2023   12:35 PM  BMP  Glucose 70 - 99 mg/dL 76  81  782   BUN 6 - 20 mg/dL 15  12  14    Creatinine 0.44 - 1.00 mg/dL 9.56  2.13  0.86   Sodium 135 - 145 mmol/L 136  134  135   Potassium 3.5 - 5.1 mmol/L 3.7  3.5  3.3   Chloride 98 - 111 mmol/L 102  104  103   CO2 22 - 32 mmol/L 20  21  21    Calcium 8.9 - 10.3 mg/dL 9.2  8.8  8.9    CBG (last 3)  Recent Labs    01/30/23 2155 01/31/23 0755 01/31/23 1233  GLUCAP 137* 95 126*    CT ABDOMEN PELVIS W CONTRAST  Result Date: 01/29/2023 CLINICAL DATA:  Acute abdominal pain EXAM: CT ABDOMEN AND PELVIS WITH CONTRAST TECHNIQUE: Multidetector CT imaging of the abdomen and pelvis was performed using the standard protocol following bolus administration of intravenous contrast. RADIATION DOSE REDUCTION: This exam was performed according to the departmental dose-optimization program which includes automated exposure control, adjustment of the mA and/or kV according to patient size and/or use of iterative reconstruction technique. CONTRAST:  60mL OMNIPAQUE IOHEXOL 350 MG/ML SOLN COMPARISON:  09/04/2019 FINDINGS: Lower chest: Mild basilar atelectasis is seen bilaterally. No focal confluent infiltrate is noted. Calcified hilar nodes are seen consistent with prior granulomatous disease. Minimal pericardial effusion is noted. Hepatobiliary: Liver is well visualized. In the posterior aspect of the left lobe of the liver laterally there is a rounded somewhat enhancing area with central decreased attenuation which measures approximately 3.4 cm. Statistically this likely represents a hemangioma although ultrasound may be helpful for further evaluation. Gallbladder is well distended. Mild wall thickening is noted. No pericholecystic fluid is noted. Pancreas: Unremarkable. No pancreatic ductal dilatation or surrounding inflammatory  changes. Spleen: Normal in size without focal abnormality. Adrenals/Urinary Tract: Left adrenal gland is within normal limits. Stable right adrenal adenoma is noted measuring up to 4.4 cm. The kidneys demonstrate a normal enhancement pattern bilaterally. No renal calculi or obstructive changes are seen. The bladder is partially distended. Stomach/Bowel: Appendix is well visualized and within normal limits. No obstructive or inflammatory changes of the colon are seen. Stomach is well distended with fluid. Multiple fluid-filled dilated loops of small bowel are identified in the mid abdomen involving the distal jejunum and proximal ileum. Throughout the course of these dilated loops are multiple areas of transition likely related to peristalsis. Distally however there is a focal transition zone best seen on image number 63 of series 3 and image number 102 of series 6. These changes are consistent with partial small bowel obstruction. The more distal ileum appears within normal limits. Vascular/Lymphatic: Aortic atherosclerosis. No enlarged abdominal or pelvic lymph nodes. Left iliac arterial stent  is in place and patent. Reproductive: Uterus and bilateral adnexa are unremarkable. Other: No abdominal wall hernia or abnormality. No abdominopelvic ascites. Musculoskeletal: Degenerative changes of lumbar spine are noted. IMPRESSION: Multiple fluid-filled dilated loops of small bowel with transition zone in the right mid abdomen consistent with partial small bowel obstruction. New partially enhancing lesion in the posterior aspect of the left lobe of the liver as described. Ultrasound would be helpful for further evaluation. Stable right adrenal adenoma.  No further follow-up is recommended. Well distended gallbladder with mild wall thickening. This is of uncertain significance. This could also be evaluated with ultrasound. Electronically Signed   By: Alcide Clever M.D.   On: 01/29/2023 19:18   CT Head Wo  Contrast  Result Date: 01/29/2023 CLINICAL DATA:  Head trauma, moderate to severe. Unexplained loss of consciousness. EXAM: CT HEAD WITHOUT CONTRAST TECHNIQUE: Contiguous axial images were obtained from the base of the skull through the vertex without intravenous contrast. RADIATION DOSE REDUCTION: This exam was performed according to the departmental dose-optimization program which includes automated exposure control, adjustment of the mA and/or kV according to patient size and/or use of iterative reconstruction technique. COMPARISON:  05/07/2022 FINDINGS: Brain: Scattered intraventricular calcifications without hydrocephalus or change. Mild low-density in the right frontal white matter, likely from old ventriculostomy. Accounting for streak artifact there is no convincing infarct. No hemorrhage, hydrocephalus, or collection. Vascular: No hyperdense vessel or unexpected calcification. Aneurysm coil mass along the left circle-of-Willis. Skull: Negative for fracture or bone lesion. Sinuses/Orbits: Right enucleation with prosthesis. Left cataract resection. IMPRESSION: No acute or interval finding. Electronically Signed   By: Tiburcio Pea M.D.   On: 01/29/2023 12:37      Discharge Instructions: Follow up with PCP to monitor TSH, T4 and restating Synthroid medication. Discuss BP meds, will hold all for now as patient not hypertensive on any BP lowering agents. Continue with miralax for bowel regimen.  Discharge Instructions     Call MD for:  difficulty breathing, headache or visual disturbances   Complete by: As directed    Call MD for:  extreme fatigue   Complete by: As directed    Call MD for:  persistant dizziness or light-headedness   Complete by: As directed    Call MD for:  persistant nausea and vomiting   Complete by: As directed    Call MD for:  severe uncontrolled pain   Complete by: As directed    Call MD for:  temperature >100.4   Complete by: As directed    Diet - low sodium heart  healthy   Complete by: As directed    Increase activity slowly   Complete by: As directed        Signed: Martie Round, Medical Student 01/31/2023, 1:25 PM   Pager: [phone]  Attestation for Student Documentation:  I personally was present and re-performed the history, physical exam and medical decision-making activities of this service and have verified that the service and findings are accurately documented in the student's note.  Rocky Morel, DO 01/31/2023, 1:27 PM

## 2023-01-31 NOTE — Discharge Instructions (Addendum)
Hello Ms. Kaas, it was a pleasure getting to know you and I appreciated the opportunity to be apart of your care team. You were brought to the hospital via EMS after your grandson found you unconscious in the bathroom. You were found to have a partial small bowel obstruction, this is when your intestines are blocked and can't pass a bowel movement. During your stay you were also found to have low thyroid hormone level, you were previously on synthroid 150 mcg we are going to restart that at the same previous dose of 150 mcg. Follow up with Dr. Fatima Sanger to manage this medication. This likely contributed to your hospitalization, additionally, we are stopping your blood pressure medications as you were found to have low blood pressure during your stay. Talk to your PCP about restarting your blood pressure medications.   Medications: Please STOP taking the following medications until you see your doctor: 1) Amlodipine 10mg  daily  2) Doxazosin 1mg  two times a day  3) Hydrochlorothiazide 25mg  once daily  4) Losartan 100mg  once a day   Please START taking: 1) Levothyroxine/Synthroid 150 mcg  2 Miralax  Please CONTINUE taking: 1) Eliquis/Apixaban 5mg  two times a day  2) Carvedilol 6.25mg  two times a day 3) Asprin 81mg  onec a day  4) Lipitor/Atorvastatin 20mg  two times a day  5) Meloxicam 7.5mg  once a day  6) Oxybutinin 40 mg once a day  7) Venlafaxine 37.5mg   8) Pantoprazole 40mg    Please return to the emergency department if you develop worsening persistent fever, abdominal pain, worsening nausea and vomiting. If you can not pass a bowel movement or pass gas return to the ED. If you develop confusion, altered mental status or a severe headache please call or PCP or return here to the ED.

## 2023-01-31 NOTE — Progress Notes (Signed)
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    PT Comments  Pt greeted seated up EOB with RN present and agreeable to session with continued progress towards acute goals. Pt continues to be limited by decreased activity tolerance and impaired balance/postural reactions with pt drifting slightly L/R in hall, demonstrating postural sway and reaching for rail support intermittently throughout gait, however pt without overt LOB. Pt requiring grossly CGA for gait and stair negotiation with pt able to ascend/descend full flight (12 steps) in stairwell without fault. Educated pt on appropriate activity progression and safety with mobility post acutely with pt verbalizing understanding. Pt continues to benefit from skilled PT services to progress toward functional mobility goals.      If plan is discharge home, recommend the following: Help with stairs or ramp for entrance;A little help with walking and/or transfers   Can travel by private vehicle        Equipment Recommendations  None recommended by PT    Recommendations for Other Services       Precautions / Restrictions Precautions Precautions: None Restrictions Weight Bearing Restrictions: No     Mobility  Bed Mobility Overal bed mobility: Modified Independent             General bed mobility comments: incr time    Transfers Overall transfer level: Needs assistance Equipment used: None Transfers: Sit to/from Stand Sit to Stand: Supervision            General transfer comment: Cues to self-monitor for activity tolerance    Ambulation/Gait Ambulation/Gait assistance: Supervision Gait Distance (Feet): 200 Feet Assistive device:  (hallway rail intermittently) Gait Pattern/deviations: Step-through pattern       General Gait Details: Mildly unsteady, and used hallway rail as needed   Stairs Stairs: Yes Stairs assistance: Contact guard assist Stair Management: Two rails, Alternating pattern, Step to pattern, Forwards Number of Stairs: 12 (flight) General stair comments: up with alternating pattern, down with step-to pattern, no overt LOB, CGA for safety on descent   Wheelchair Mobility     Tilt Bed    Modified Rankin (Stroke Patients Only)       Balance Overall balance assessment: Mild deficits observed, not formally tested                                          Cognition Arousal: Alert Behavior During Therapy: WFL for tasks assessed/performed Overall Cognitive Status: Within Functional Limits for tasks assessed                                          Exercises      General Comments General comments (skin integrity, edema, etc.): Orthostatic vitals obtained by RA at start of session and normal; see vitals flowsheets for details      Pertinent Vitals/Pain Pain Assessment Pain Assessment: No/denies pain  Home Living                          Prior Function            PT Goals (current goals can now be found in the care plan section) Acute Rehab PT Goals Patient Stated Goal: home soon PT Goal Formulation: With patient Time For Goal Achievement: 02/13/23 Progress towards PT goals: Progressing toward goals    Frequency    Min 1X/week      PT Plan      Co-evaluation              AM-PAC PT "6 Clicks" Mobility   Outcome Measure  Help needed turning from your back to your side while in a flat bed without using bedrails?: None Help  needed moving from lying on your back to sitting on the side of a flat bed without using bedrails?: None Help needed moving to and from a bed to a chair (including a wheelchair)?: A Little Help needed standing up from a chair using your arms (e.g., wheelchair or bedside chair)?: A Little Help needed to walk in hospital room?: A Little Help needed climbing 3-5 steps with a railing? : A Little 6 Click Score: 20    End of Session Equipment Utilized During Treatment: Gait belt Activity Tolerance: Patient tolerated treatment well Patient left: in bed;with call bell/phone within reach;with family/visitor present (with grandson present) Nurse Communication: Mobility status PT Visit Diagnosis: Unsteadiness on feet (R26.81)     Time: 3086-5784 PT Time Calculation (min) (ACUTE ONLY): 14 min  Charges:    $Gait Training: 8-22 mins PT General Charges $$ ACUTE PT VISIT: 1 Visit                     Lenora Boys. PTA Acute Rehabilitation Services Office: [phone]   Catalina Antigua 01/31/2023, 10:58 AM

## 2023-02-04 ENCOUNTER — Other Ambulatory Visit: Payer: Self-pay | Admitting: Family

## 2023-02-04 DIAGNOSIS — K769 Liver disease, unspecified: Secondary | ICD-10-CM

## 2023-03-08 ENCOUNTER — Other Ambulatory Visit: Payer: Self-pay | Admitting: Family

## 2023-03-08 DIAGNOSIS — Z1231 Encounter for screening mammogram for malignant neoplasm of breast: Secondary | ICD-10-CM

## 2023-04-04 ENCOUNTER — Ambulatory Visit: Payer: 59

## 2024-02-16 ENCOUNTER — Ambulatory Visit (HOSPITAL_COMMUNITY): Admission: EM | Admit: 2024-02-16 | Discharge: 2024-02-16 | Disposition: A | Discharge status: Home / Self Care

## 2024-02-16 ENCOUNTER — Encounter (HOSPITAL_COMMUNITY): Payer: Self-pay

## 2024-02-16 ENCOUNTER — Ambulatory Visit (INDEPENDENT_AMBULATORY_CARE_PROVIDER_SITE_OTHER)

## 2024-02-16 DIAGNOSIS — M25562 Pain in left knee: Secondary | ICD-10-CM | POA: Diagnosis not present
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
## 2024-02-16 NOTE — Discharge Instructions (Addendum)
 Follow-up with orthopedics Use Tylenol  and topical gel as needed Use knee brace as needed

## 2024-03-09 ENCOUNTER — Other Ambulatory Visit: Payer: Self-pay | Admitting: Nurse Practitioner

## 2024-03-09 DIAGNOSIS — M25562 Pain in left knee: Secondary | ICD-10-CM

## 2024-03-13 ENCOUNTER — Other Ambulatory Visit: Payer: Self-pay | Admitting: Nurse Practitioner

## 2024-03-13 DIAGNOSIS — N183 Chronic kidney disease, stage 3 unspecified: Secondary | ICD-10-CM

## 2024-03-14 ENCOUNTER — Ambulatory Visit
Admission: RE | Admit: 2024-03-14 | Discharge: 2024-03-14 | Disposition: A | Discharge status: Home / Self Care | Source: Ambulatory Visit | Attending: Nurse Practitioner | Admitting: Nurse Practitioner

## 2024-03-14 DIAGNOSIS — N183 Chronic kidney disease, stage 3 unspecified: Secondary | ICD-10-CM

## 2024-04-09 ENCOUNTER — Inpatient Hospital Stay: Admission: RE | Admit: 2024-04-09 | Source: Ambulatory Visit

## 2024-04-21 ENCOUNTER — Emergency Department (HOSPITAL_COMMUNITY)
Admission: EM | Admit: 2024-04-21 | Discharge: 2024-04-22 | Disposition: A | Discharge status: Home / Self Care | Attending: Emergency Medicine | Admitting: Emergency Medicine

## 2024-04-21 ENCOUNTER — Emergency Department (HOSPITAL_COMMUNITY)

## 2024-04-21 ENCOUNTER — Encounter (HOSPITAL_COMMUNITY): Payer: Self-pay | Admitting: *Deleted

## 2024-04-21 ENCOUNTER — Other Ambulatory Visit: Payer: Self-pay

## 2024-04-21 DIAGNOSIS — Z7982 Long term (current) use of aspirin: Secondary | ICD-10-CM | POA: Diagnosis not present

## 2024-04-21 DIAGNOSIS — N3000 Acute cystitis without hematuria: Secondary | ICD-10-CM | POA: Diagnosis not present

## 2024-04-21 DIAGNOSIS — Z7901 Long term (current) use of anticoagulants: Secondary | ICD-10-CM | POA: Diagnosis not present

## 2024-04-21 DIAGNOSIS — D1803 Hemangioma of intra-abdominal structures: Secondary | ICD-10-CM | POA: Diagnosis not present

## 2024-04-21 DIAGNOSIS — R109 Unspecified abdominal pain: Secondary | ICD-10-CM | POA: Diagnosis present

## 2024-04-21 LAB — URINALYSIS, ROUTINE W REFLEX MICROSCOPIC
Bilirubin Urine: NEGATIVE
Glucose, UA: NEGATIVE mg/dL
Hgb urine dipstick: NEGATIVE
Ketones, ur: 20 mg/dL — AB
Nitrite: NEGATIVE
Protein, ur: >=300 mg/dL — AB
Specific Gravity, Urine: 1.023 (ref 1.005–1.030)
WBC, UA: >50 WBC/hpf (ref 0–5)
pH: 5 (ref 5.0–8.0)

## 2024-04-21 LAB — I-STAT CHEM 8, ED
BUN: 13 mg/dL (ref 6–20)
Calcium, Ion: 1.07 mmol/L — ABNORMAL LOW (ref 1.15–1.40)
Chloride: 109 mmol/L (ref 98–111)
Creatinine, Ser: 1.5 mg/dL — ABNORMAL HIGH (ref 0.44–1.00)
Glucose, Bld: 129 mg/dL — ABNORMAL HIGH (ref 70–99)
HCT: 45 % (ref 36.0–46.0)
Hemoglobin: 15.3 g/dL — ABNORMAL HIGH (ref 12.0–15.0)
Potassium: 3.3 mmol/L — ABNORMAL LOW (ref 3.5–5.1)
Sodium: 141 mmol/L (ref 135–145)
TCO2: 22 mmol/L (ref 22–32)

## 2024-04-21 LAB — COMPREHENSIVE METABOLIC PANEL WITH GFR
ALT: 74 U/L — ABNORMAL HIGH (ref 0–44)
AST: 52 U/L — ABNORMAL HIGH (ref 15–41)
Albumin: 4.5 g/dL (ref 3.5–5.0)
Alkaline Phosphatase: 90 U/L (ref 38–126)
Anion gap: 16 — ABNORMAL HIGH (ref 5–15)
BUN: 12 mg/dL (ref 6–20)
CO2: 18 mmol/L — ABNORMAL LOW (ref 22–32)
Calcium: 9.3 mg/dL (ref 8.9–10.3)
Chloride: 105 mmol/L (ref 98–111)
Creatinine, Ser: 1.5 mg/dL — ABNORMAL HIGH (ref 0.44–1.00)
GFR, Estimated: 40 mL/min — ABNORMAL LOW (ref >60)
Glucose, Bld: 130 mg/dL — ABNORMAL HIGH (ref 70–99)
Potassium: 3.4 mmol/L — ABNORMAL LOW (ref 3.5–5.1)
Sodium: 139 mmol/L (ref 135–145)
Total Bilirubin: 1.3 mg/dL — ABNORMAL HIGH (ref 0.0–1.2)
Total Protein: 8.5 g/dL — ABNORMAL HIGH (ref 6.5–8.1)

## 2024-04-21 LAB — CBC WITH DIFFERENTIAL/PLATELET
Abs Immature Granulocytes: 0.02 K/uL (ref 0.00–0.07)
Basophils Absolute: 0.1 K/uL (ref 0.0–0.1)
Basophils Relative: 1 %
Eosinophils Absolute: 0 K/uL (ref 0.0–0.5)
Eosinophils Relative: 0 %
HCT: 43.3 % (ref 36.0–46.0)
Hemoglobin: 14.5 g/dL (ref 12.0–15.0)
Immature Granulocytes: 0 %
Lymphocytes Relative: 32 %
Lymphs Abs: 3 K/uL (ref 0.7–4.0)
MCH: 30.5 pg (ref 26.0–34.0)
MCHC: 33.5 g/dL (ref 30.0–36.0)
MCV: 91.2 fL (ref 80.0–100.0)
Monocytes Absolute: 0.5 K/uL (ref 0.1–1.0)
Monocytes Relative: 5 %
Neutro Abs: 5.9 K/uL (ref 1.7–7.7)
Neutrophils Relative %: 62 %
Platelets: 361 K/uL (ref 150–400)
RBC: 4.75 MIL/uL (ref 3.87–5.11)
RDW: 15.8 % — ABNORMAL HIGH (ref 11.5–15.5)
WBC: 9.6 K/uL (ref 4.0–10.5)
nRBC: 0 % (ref 0.0–0.2)

## 2024-04-21 LAB — LIPASE, BLOOD: Lipase: 50 U/L (ref 11–51)

## 2024-04-21 LAB — I-STAT CG4 LACTIC ACID, ED: Lactic Acid, Venous: 1.8 mmol/L (ref 0.5–1.9)

## 2024-04-21 MED ORDER — ONDANSETRON 4 MG PO TBDP
8.0000 mg | ORAL_TABLET | Freq: Once | ORAL | Status: AC
  Administered 2024-04-21: 8 mg via ORAL
  Filled 2024-04-21: qty 2

## 2024-04-21 MED ORDER — IOHEXOL 350 MG/ML SOLN
75.0000 mL | Freq: Once | INTRAVENOUS | Status: AC | PRN
  Administered 2024-04-21: 75 mL via INTRAVENOUS

## 2024-04-21 NOTE — ED Provider Triage Note (Signed)
 Emergency Medicine Provider Triage Evaluation Note  Jordan Harvey , a 58 y.o. female  was evaluated in triage.  Pt complains of lower abdominal pain waxing waning for the past 2 to 3 days.  Reports nausea but no vomiting.  Constipation.  Reports she is passing lots of gas.  Denies any urinary symptoms.  Review of Systems  Positive:  Negative:   Physical Exam  BP (!) 138/104 (BP Location: Right Arm)   Pulse (!) 110   Temp 98.3 F (36.8 C)   Resp 20   Ht 5' 4 (1.626 m)   Wt 89.9 kg   LMP 03/21/2014   SpO2 92%   BMI 34.02 kg/m  Gen:   Awake, no distress   Resp:  Normal effort  MSK:   Moves extremities without difficulty  Other:  Abdomen soft with some generalized tenderness  Medical Decision Making  Medically screening exam initiated at 7:15 PM.  Appropriate orders placed.  Jordan Harvey was informed that the remainder of the evaluation will be completed by another provider, this initial triage assessment does not replace that evaluation, and the importance of remaining in the ED until their evaluation is complete.  Labs and CT imaging   Jordan Harvey, [place] 04/21/24 8083

## 2024-04-21 NOTE — ED Triage Notes (Signed)
 Pt here for abd pain and headache for 3 days. C/O nausea/ vomiting. C/O of left arm numbness 3 days ago. Axox4.

## 2024-04-22 DIAGNOSIS — N3000 Acute cystitis without hematuria: Secondary | ICD-10-CM | POA: Diagnosis not present

## 2024-04-22 MED ORDER — SODIUM CHLORIDE 0.9 % IV SOLN
2.0000 g | Freq: Once | INTRAVENOUS | Status: AC
  Administered 2024-04-22: 2 g via INTRAVENOUS
  Filled 2024-04-22: qty 20

## 2024-04-22 MED ORDER — SODIUM CHLORIDE 0.9 % IV BOLUS
1000.0000 mL | Freq: Once | INTRAVENOUS | Status: AC
  Administered 2024-04-22: 1000 mL via INTRAVENOUS

## 2024-04-22 MED ORDER — CEPHALEXIN 500 MG PO CAPS: 500.0000 mg | ORAL_CAPSULE | Freq: Four times a day (QID) | ORAL | 0 refills | Status: AC

## 2024-04-22 MED ORDER — ONDANSETRON 4 MG PO TBDP: 4.0000 mg | ORAL_TABLET | Freq: Three times a day (TID) | ORAL | 0 refills | Status: AC | PRN

## 2024-04-22 NOTE — ED Provider Notes (Signed)
 [place] EMERGENCY DEPARTMENT AT [facility] Provider Note   CSN: [id number] Arrival date & time: 04/21/24  1757     Patient presents with: Abdominal Pain   Jordan Harvey is a 58 y.o. female.   The patient is presenting with a chief complaint of abdominal pain that began approximately three to four days ago. The pain is described as soreness located primarily in the lower abdomen, more pronounced on the left side. The patient reports associated symptoms of nausea, with occasional vomiting, and has not experienced bowel movements for the past two to three days. The patient denies any urinary symptoms such as burning or changes in frequency. The patient has a past medical history of diabetes and is on blood thinners for a history of blood clots. Additionally, the patient experienced an aneurysm three years ago. The patient lives alone and has had difficulty managing medications, noting a recent lapse in adherence. The history was obtained from the patient.   Abdominal Pain      Prior to Admission medications   Medication Sig Start Date End Date Taking? Authorizing Provider  cephALEXin  (KEFLEX ) 500 MG capsule Take 1 capsule (500 mg total) by mouth 4 (four) times daily. 04/22/24  Yes Kaled Allende, Selinda, MD  ondansetron  (ZOFRAN -ODT) 4 MG disintegrating tablet Take 1 tablet (4 mg total) by mouth every 8 (eight) hours as needed for vomiting. 04/22/24  Yes Janaki Exley, Selinda, MD  acetaminophen  (TYLENOL ) 325 MG tablet Take 2 tablets (650 mg total) by mouth every 6 (six) hours as needed. 01/02/17   Layden, Lindsey A, PA-C  aspirin  EC 81 MG EC tablet Take 1 tablet (81 mg total) by mouth daily. 01/11/16   Ahmed, Tasrif, MD  atorvastatin  (LIPITOR) 20 MG tablet Take 2 tablets (40 mg total) by mouth daily. Reported on 12/11/2015 04/27/16   Ward, Josette SAILOR, DO  carvedilol  (COREG ) 6.25 MG tablet Take 6.25 mg by mouth 2 (two) times daily with a meal.    [provider]  ELIQUIS  5 MG TABS  tablet Take 5 mg by mouth 2 (two) times daily. 01/26/23   [provider]  gabapentin  (NEURONTIN ) 300 MG capsule Take 300 mg by mouth 3 (three) times daily.    [provider]  hydrOXYzine  (ATARAX ) 50 MG tablet Take 50 mg by mouth 3 (three) times daily. 01/26/23   [provider]  levothyroxine  (SYNTHROID ) 150 MCG tablet Take 1 tablet (150 mcg total) by mouth daily. 01/31/23 01/31/24  Jolaine Pac, DO  meloxicam  (MOBIC ) 7.5 MG tablet Take 7.5 mg by mouth daily. 04/27/19   [provider]  montelukast (SINGULAIR) 10 MG tablet Take 10 mg by mouth at bedtime.    [provider]  oxybutynin  (DITROPAN -XL) 10 MG 24 hr tablet Take 10 mg by mouth daily.    [provider]  pantoprazole  (PROTONIX ) 40 MG tablet Take by mouth daily. 04/27/19   [provider]  polyethylene glycol (MIRALAX  / GLYCOLAX ) 17 g packet Take 17 g by mouth daily. 01/31/23   Jolaine Pac, DO  venlafaxine XR (EFFEXOR-XR) 37.5 MG 24 hr capsule Take 37.5 mg by mouth daily. 01/26/23   [provider]    Allergies: Bee venom    Review of Systems  Gastrointestinal:  Positive for abdominal pain.    Updated Vital Signs BP 128/84   Pulse 80   Temp 97.6 F (36.4 C) (Oral)   Resp 18   Ht 5' 4 (1.626 m)   Wt 89.9 kg   LMP  03/21/2014   SpO2 96%   BMI 34.02 kg/m   Physical Exam Vitals and nursing note reviewed.  Constitutional:      Appearance: She is well-developed.  HENT:     Head: Normocephalic and atraumatic.  Cardiovascular:     Rate and Rhythm: Normal rate and regular rhythm.  Pulmonary:     Effort: No respiratory distress.     Breath sounds: No stridor.  Abdominal:     General: There is no distension.     Tenderness: There is abdominal tenderness in the suprapubic area and left lower quadrant.  Musculoskeletal:     Cervical back: Normal range of motion.  Skin:    General: Skin is warm and dry.  Neurological:     Mental Status: She is  alert.     (all labs ordered are listed, but only abnormal results are displayed) Labs Reviewed  CBC WITH DIFFERENTIAL/PLATELET - Abnormal; Notable for the following components:      Result Value   RDW 15.8 (*)    All other components within normal limits  COMPREHENSIVE METABOLIC PANEL WITH GFR - Abnormal; Notable for the following components:   Potassium 3.4 (*)    CO2 18 (*)    Glucose, Bld 130 (*)    Creatinine, Ser 1.50 (*)    Total Protein 8.5 (*)    AST 52 (*)    ALT 74 (*)    Total Bilirubin 1.3 (*)    GFR, Estimated 40 (*)    Anion gap 16 (*)    All other components within normal limits  URINALYSIS, ROUTINE W REFLEX MICROSCOPIC - Abnormal; Notable for the following components:   Color, Urine AMBER (*)    APPearance CLOUDY (*)    Ketones, ur 20 (*)    Protein, ur >=300 (*)    Leukocytes,Ua LARGE (*)    Bacteria, UA MANY (*)    All other components within normal limits  I-STAT CHEM 8, ED - Abnormal; Notable for the following components:   Potassium 3.3 (*)    Creatinine, Ser 1.50 (*)    Glucose, Bld 129 (*)    Calcium , Ion 1.07 (*)    Hemoglobin 15.3 (*)    All other components within normal limits  CULTURE, BLOOD (ROUTINE X 2)  CULTURE, BLOOD (ROUTINE X 2)  URINE CULTURE  LIPASE, BLOOD  I-STAT CG4 LACTIC ACID, ED    EKG: None  Radiology: CT ABDOMEN PELVIS W CONTRAST Result Date: 04/21/2024 CLINICAL DATA:  Abdominal pain EXAM: CT ABDOMEN AND PELVIS WITH CONTRAST TECHNIQUE: Multidetector CT imaging of the abdomen and pelvis was performed using the standard protocol following bolus administration of intravenous contrast. RADIATION DOSE REDUCTION: This exam was performed according to the departmental dose-optimization program which includes automated exposure control, adjustment of the mA and/or kV according to patient size and/or use of iterative reconstruction technique. CONTRAST:  75mL OMNIPAQUE  IOHEXOL  350 MG/ML SOLN COMPARISON:  January 29, 2023 FINDINGS:  Lower chest: No acute abnormality. Hepatobiliary: A 3.8 cm area of low attenuation and peripheral enhancement is seen within the left lobe of the liver. This is seen on the prior study (measured 3.5 cm on the prior exam). The gallbladder is moderately distended without evidence of pericholecystic inflammation, gallstones, gallbladder wall thickening, or biliary dilatation. Pancreas: Unremarkable. No pancreatic ductal dilatation or surrounding inflammatory changes. Spleen: Normal in size without focal abnormality. Adrenals/Urinary Tract: A 4.4 cm heterogeneous, right adrenal mass is seen with areas of rapid initial enhancement and subsequent washout on  delayed images. The left adrenal gland is unremarkable. Kidneys are normal, without renal calculi, focal lesion, or hydronephrosis. The urinary bladder is empty and subsequently limited in evaluation. Diffuse urinary bladder wall thickening is noted. Stomach/Bowel: Stomach is within normal limits. Appendix appears normal. No evidence of bowel wall thickening, distention, or inflammatory changes. Vascular/Lymphatic: Aortic atherosclerosis. A patent left common iliac artery stent is noted. No enlarged abdominal or pelvic lymph nodes. Reproductive: Uterus and bilateral adnexa are unremarkable. Other: No abdominal wall hernia or abnormality. No abdominopelvic ascites. Musculoskeletal: No acute or significant osseous findings. IMPRESSION: 1. Findings consistent with the patient's known right adrenal adenoma. 2. Additional findings likely representing a hemangioma within the left lobe of the liver. Correlation with nonemergent hepatic ultrasound is recommended. 3. Diffuse urinary bladder wall thickening which may represent sequelae associated with cystitis. Correlation with urinalysis is recommended. 4. Aortic atherosclerosis. Electronically Signed   By: Suzen Dials M.D.   On: 04/21/2024 22:12     Procedures   Medications Ordered in the ED  ondansetron   (ZOFRAN -ODT) disintegrating tablet 8 mg (8 mg Oral Given 04/21/24 2049)  iohexol  (OMNIPAQUE ) 350 MG/ML injection 75 mL (75 mLs Intravenous Contrast Given 04/21/24 2133)  cefTRIAXone  (ROCEPHIN ) 2 g in sodium chloride  0.9 % 100 mL IVPB (0 g Intravenous Stopped 04/22/24 0253)  sodium chloride  0.9 % bolus 1,000 mL (0 mLs Intravenous Stopped 04/22/24 0252)                                    Medical Decision Making Amount and/or Complexity of Data Reviewed Labs: ordered.  Risk Prescription drug management.   The patient presented with abdominal pain primarily in the lower left quadrant, ongoing for three to four days, accompanied by nausea and lack of energy. The patient has a history of diabetes and is on blood thinners for blood clots. A CT scan and labs were performed, revealing a urinary tract infection and a thickened bladder. The patient was administered IV antibiotics in the emergency department. A liver hemangioma was noted on imaging, which requires follow-up with an ultrasound but is not an immediate concern. The patient was advised to follow up with their primary care physician for further evaluation and management.  Differential Diagnosis: Differential diagnosis includes but is not limited to: urinary tract infection, pyelonephritis, diverticulitis, and gastrointestinal infection.  Diagnostics Review  Laboratory Interpretation (Interpreted by me): Urinalysis indicates a urinary tract infection.  Imaging Interpretation (Interpreted by me): CT scan shows a thickened bladder and a liver hemangioma.  Care significantly affected by the following chronic Conditions: Diabetes; may contribute to increased risk of infections and complicate recovery from urinary tract infections.  Care significantly affected by the following Social Determinants of Health: The patient lives alone, which may impact their ability to manage medications and follow-up care  effectively.  Management  Medications: IV antibiotics administered in the emergency department.  Re-Evaluations/Course of Care: The patient was re-evaluated after administration of IV antibiotics and fluids, no further needs identified, plans to discharge with prescriptions for oral antibiotics and anti-nausea medication.  Final diagnoses:  Acute cystitis without hematuria  Liver hemangioma    ED Discharge Orders          Ordered    cephALEXin  (KEFLEX ) 500 MG capsule  4 times daily        04/22/24 0120    ondansetron  (ZOFRAN -ODT) 4 MG disintegrating tablet  Every 8 hours PRN  04/22/24 0120               Beatrice Sehgal, Selinda, MD 04/22/24 9159

## 2024-04-24 LAB — URINE CULTURE: Culture: >=100000 — AB

## 2024-04-25 ENCOUNTER — Telehealth (HOSPITAL_BASED_OUTPATIENT_CLINIC_OR_DEPARTMENT_OTHER): Payer: Self-pay | Admitting: Emergency Medicine

## 2024-04-25 NOTE — Telephone Encounter (Signed)
 Post ED Visit - Positive Culture Follow-up  Culture report reviewed by antimicrobial stewardship pharmacist: [facility] []  Rankin Dee, Pharm.D. []  Venetia Gully, Pharm.D., BCPS AQ-ID []  Garrel Crews, Pharm.D., BCPS []  Almarie Lunger, Pharm.D., BCPS []  [place], [address].D., BCPS, AAHIVP []  Rosaline Bihari, Pharm.D., BCPS, AAHIVP [x]  Vernell Meier, PharmD, BCPS []  Latanya Hint, PharmD, BCPS []  Donald Medley, PharmD, BCPS []  Rocky Bold, PharmD []  Dorothyann Alert, PharmD, BCPS []  Morene Babe, PharmD  [facility] []  Rosaline Edison, PharmD []  Romona Bliss, PharmD []  Dolphus Roller, PharmD []  Veva Seip, Rph []  Vernell Daunt) Leonce, PharmD []  Eva Allis, PharmD []  Rosaline Millet, PharmD []  Iantha Batch, PharmD []  Arvin Gauss, PharmD []  Wanda Hasting, PharmD []  Ronal Rav, PharmD []  Rocky Slade, PharmD []  Bard Jeans, PharmD   Positive urine culture Treated with Cephalexin , organism sensitive to the same and no further patient follow-up is required at this time.  Clotilda BROCKS Jalen Daluz 04/25/2024, 11:27 AM

## 2024-04-26 LAB — CULTURE, BLOOD (ROUTINE X 2)
Culture: NO GROWTH
Culture: NO GROWTH

## 2024-05-22 ENCOUNTER — Inpatient Hospital Stay: Admission: RE | Admit: 2024-05-22

## 2024-07-31 ENCOUNTER — Ambulatory Visit: Admitting: Dermatology
